# Patient Record
Sex: Male | Born: 1945 | Race: White | Hispanic: No | Smoking: Current every day smoker
Health system: Southern US, Community
[De-identification: ages and names within clinical notes are randomized; demographics above are authoritative.]

## PROBLEM LIST (undated history)

## (undated) DIAGNOSIS — C801 Malignant (primary) neoplasm, unspecified: Secondary | ICD-10-CM

## (undated) DIAGNOSIS — F329 Major depressive disorder, single episode, unspecified: Secondary | ICD-10-CM

## (undated) DIAGNOSIS — K219 Gastro-esophageal reflux disease without esophagitis: Secondary | ICD-10-CM

## (undated) DIAGNOSIS — F32A Depression, unspecified: Secondary | ICD-10-CM

## (undated) DIAGNOSIS — Z974 Presence of external hearing-aid: Secondary | ICD-10-CM

## (undated) DIAGNOSIS — C20 Malignant neoplasm of rectum: Principal | ICD-10-CM

## (undated) DIAGNOSIS — H9190 Unspecified hearing loss, unspecified ear: Secondary | ICD-10-CM

## (undated) DIAGNOSIS — Z972 Presence of dental prosthetic device (complete) (partial): Secondary | ICD-10-CM

## (undated) HISTORY — DX: Malignant neoplasm of rectum: C20

## (undated) HISTORY — PX: CHOLECYSTECTOMY: SHX55

---
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
## 2009-09-22 ENCOUNTER — Emergency Department (HOSPITAL_COMMUNITY): Admission: EM | Admit: 2009-09-22 | Discharge: 2009-09-22 | Payer: Self-pay | Admitting: Emergency Medicine

## 2011-03-07 LAB — CULTURE, ROUTINE-ABSCESS

## 2013-02-22 ENCOUNTER — Telehealth: Payer: Self-pay | Admitting: Family Medicine

## 2013-02-22 NOTE — Telephone Encounter (Signed)
Ok to refill 

## 2013-02-22 NOTE — Telephone Encounter (Signed)
[facility]

## 2013-02-22 NOTE — Telephone Encounter (Signed)
Ok to refill with 30 and 2 refill

## 2013-07-01 ENCOUNTER — Other Ambulatory Visit: Payer: Self-pay | Admitting: Family Medicine

## 2013-07-01 NOTE — Telephone Encounter (Signed)
Ok to refill 

## 2013-07-01 NOTE — Telephone Encounter (Signed)
?   OK to Refill  

## 2013-07-02 NOTE — Telephone Encounter (Signed)
rx called in

## 2013-10-14 ENCOUNTER — Other Ambulatory Visit: Payer: Self-pay | Admitting: Family Medicine

## 2013-10-15 NOTE — Telephone Encounter (Signed)
This patient has had no office visit since being on Epic. Review his  paper chart to see date of last office visit. See If he  has other medical problems that need to be addressed. If he has other medical problems that need to  be addressed then he needs to schedule office visit prior to any refills . If there really are no other medical problems and he has been seen within the past year, then it is okay to give him refills for #30+2 additional refills.

## 2013-10-15 NOTE — Telephone Encounter (Signed)
?   OK to Refill  

## 2013-11-05 ENCOUNTER — Telehealth: Payer: Self-pay | Admitting: Family Medicine

## 2013-11-05 NOTE — Telephone Encounter (Signed)
NEEDS ZOLPIDEM TARTRATE 10MG 

## 2013-11-08 NOTE — Telephone Encounter (Signed)
.?   OK to Refill - LOV 12/2012

## 2013-11-08 NOTE — Telephone Encounter (Signed)
ok 

## 2013-11-09 MED ORDER — ZOLPIDEM TARTRATE 10 MG PO TABS: ORAL_TABLET | ORAL | Status: DC

## 2013-11-09 NOTE — Telephone Encounter (Signed)
Med called out to pharm 

## 2014-03-29 ENCOUNTER — Other Ambulatory Visit: Payer: Self-pay | Admitting: Family Medicine

## 2014-03-29 NOTE — Telephone Encounter (Signed)
?   OK to Refill  

## 2014-03-29 NOTE — Telephone Encounter (Signed)
ok 

## 2014-08-23 ENCOUNTER — Telehealth: Payer: Self-pay | Admitting: Family Medicine

## 2014-08-23 ENCOUNTER — Encounter: Payer: Self-pay | Admitting: Family Medicine

## 2014-08-23 NOTE — Telephone Encounter (Signed)
Attempted to call pt to schedule [facility] the phone was disconnected I am sending a letter to the patient

## 2014-08-25 ENCOUNTER — Other Ambulatory Visit: Payer: Self-pay | Admitting: Family Medicine

## 2014-08-26 NOTE — Telephone Encounter (Signed)
Medication called to pharmacy.  Letter sent.  

## 2014-08-26 NOTE — Telephone Encounter (Signed)
ok 

## 2014-08-26 NOTE — Telephone Encounter (Signed)
Ok to refill??  Last office visit 12/2012.  Last refill 03/30/2014, #2 refills.  Ok to send letter?

## 2014-09-06 ENCOUNTER — Telehealth: Payer: Self-pay | Admitting: Family Medicine

## 2014-09-06 ENCOUNTER — Encounter: Payer: Self-pay | Admitting: Family Medicine

## 2014-09-06 NOTE — Telephone Encounter (Signed)
Letter sent pt to call and schedule [facility]

## 2014-09-27 ENCOUNTER — Other Ambulatory Visit: Payer: Self-pay | Admitting: Family Medicine

## 2014-09-27 NOTE — Telephone Encounter (Signed)
?   OK to Refill  

## 2014-09-27 NOTE — Telephone Encounter (Signed)
ok 

## 2014-09-28 NOTE — Telephone Encounter (Signed)
Script refilled and sent to pharmacy. 

## 2014-09-29 ENCOUNTER — Other Ambulatory Visit: Payer: Self-pay | Admitting: Family Medicine

## 2014-09-29 NOTE — Telephone Encounter (Signed)
?   OK to Refill -  Has not been here since Epic. This is the only med he takes

## 2014-09-29 NOTE — Telephone Encounter (Signed)
Refill appropriate and filled per protocol. 

## 2014-09-29 NOTE — Telephone Encounter (Signed)
ok 

## 2014-11-08 ENCOUNTER — Other Ambulatory Visit: Payer: Self-pay | Admitting: Family Medicine

## 2014-11-09 NOTE — Telephone Encounter (Signed)
Ok to refill 

## 2014-11-10 ENCOUNTER — Encounter: Payer: Self-pay | Admitting: *Deleted

## 2014-11-10 NOTE — Telephone Encounter (Signed)
I have never seen this patient

## 2014-11-10 NOTE — Telephone Encounter (Signed)
Refill denied.   Requires office visit before any further refills can be given.   Letter sent.

## 2014-11-12 ENCOUNTER — Emergency Department: Payer: Self-pay | Admitting: Internal Medicine

## 2014-11-12 LAB — COMPREHENSIVE METABOLIC PANEL
ALBUMIN: 3.3 g/dL — AB (ref 3.4–5.0)
ALK PHOS: 70 U/L
ALT: 20 U/L
ANION GAP: 5 — AB (ref 7–16)
BILIRUBIN TOTAL: 0.3 mg/dL (ref 0.2–1.0)
BUN: 11 mg/dL (ref 7–18)
CALCIUM: 8.2 mg/dL — AB (ref 8.5–10.1)
CHLORIDE: 107 mmol/L (ref 98–107)
CREATININE: 0.88 mg/dL (ref 0.60–1.30)
Co2: 28 mmol/L (ref 21–32)
EGFR (Non-African Amer.): >60
Glucose: 93 mg/dL (ref 65–99)
OSMOLALITY: 278 (ref 275–301)
POTASSIUM: 4.2 mmol/L (ref 3.5–5.1)
SGOT(AST): 25 U/L (ref 15–37)
Sodium: 140 mmol/L (ref 136–145)
TOTAL PROTEIN: 7.1 g/dL (ref 6.4–8.2)

## 2014-11-12 LAB — CBC
HCT: 48.2 % (ref 40.0–52.0)
HGB: 16.1 g/dL (ref 13.0–18.0)
MCH: 31.1 pg (ref 26.0–34.0)
MCHC: 33.5 g/dL (ref 32.0–36.0)
MCV: 93 fL (ref 80–100)
Platelet: 358 10*3/uL (ref 150–440)
RBC: 5.19 10*6/uL (ref 4.40–5.90)
RDW: 13.7 % (ref 11.5–14.5)
WBC: 10.9 10*3/uL — AB (ref 3.8–10.6)

## 2014-11-12 LAB — TROPONIN I
Troponin-I: <0.02 ng/mL
Troponin-I: <0.02 ng/mL

## 2014-11-15 ENCOUNTER — Other Ambulatory Visit: Payer: Self-pay | Admitting: Family Medicine

## 2014-11-15 NOTE — Telephone Encounter (Signed)
Pt has not been seen in over 1 year.  Has been sent multiple letters to make appt and has not.  Refill denied

## 2014-11-16 ENCOUNTER — Encounter: Payer: Self-pay | Admitting: Cardiovascular Disease

## 2014-11-16 ENCOUNTER — Ambulatory Visit (INDEPENDENT_AMBULATORY_CARE_PROVIDER_SITE_OTHER): Payer: Medicare PPO | Admitting: Cardiovascular Disease

## 2014-11-16 VITALS — BP 120/82 | HR 66 | Ht 66.0 in | Wt 151.0 lb

## 2014-11-16 DIAGNOSIS — Z7189 Other specified counseling: Secondary | ICD-10-CM

## 2014-11-16 DIAGNOSIS — R079 Chest pain, unspecified: Secondary | ICD-10-CM

## 2014-11-16 DIAGNOSIS — E785 Hyperlipidemia, unspecified: Secondary | ICD-10-CM

## 2014-11-16 DIAGNOSIS — G4700 Insomnia, unspecified: Secondary | ICD-10-CM

## 2014-11-16 DIAGNOSIS — I714 Abdominal aortic aneurysm, without rupture, unspecified: Secondary | ICD-10-CM

## 2014-11-16 DIAGNOSIS — Z7689 Persons encountering health services in other specified circumstances: Secondary | ICD-10-CM

## 2014-11-16 DIAGNOSIS — Z720 Tobacco use: Secondary | ICD-10-CM

## 2014-11-16 MED ORDER — ZOLPIDEM TARTRATE 10 MG PO TABS: 10.0000 mg | ORAL_TABLET | Freq: Every evening | ORAL | Status: DC | PRN

## 2014-11-16 NOTE — Assessment & Plan Note (Signed)
I requested fasting lipid and liver profile. 

## 2014-11-16 NOTE — Assessment & Plan Note (Signed)
I requested an abdominal aortic ultrasound to evaluate for an aneurysm given his age and prolonged history of tobacco use.

## 2014-11-16 NOTE — Patient Instructions (Addendum)
Your physician recommends that you continue on your current medications as directed. Please refer to the Current Medication list given to you today.  Your physician has requested that you have an abdominal aorta duplex. During this test, an ultrasound is used to evaluate the aorta. Allow 30 minutes for this exam. Do not eat after midnight the day before and avoid carbonated beverages  Your physician recommends that you return for lab work in:  Fasting Lipid and Liver Panel the same day as your abdominal aortic duplex   You have been referred to [facility] for [place].   Your physician recommends that you schedule a follow-up appointment in:  Dr. Fletcher Anon after your tests   [place]  Your caregiver has ordered a Stress Test with nuclear imaging. The purpose of this test is to evaluate the blood supply to your heart muscle. This procedure is referred to as a "Non-Invasive Stress Test." This is because other than having an IV started in your vein, nothing is inserted or "invades" your body. Cardiac stress tests are done to find areas of poor blood flow to the heart by determining the extent of coronary artery disease (CAD). Some patients exercise on a treadmill, which naturally increases the blood flow to your heart, while others who are  unable to walk on a treadmill due to physical limitations have a pharmacologic/chemical stress agent called Lexiscan . This medicine will mimic walking on a treadmill by temporarily increasing your coronary blood flow.   Please note: these test may take anywhere between 2-4 hours to complete  PLEASE REPORT TO [place] AT THE FIRST DESK WILL DIRECT YOU WHERE TO GO  Date of Procedure:_________12/18/15____________________________  Arrival Time for Procedure:______1015 am________________________    PLEASE NOTIFY THE OFFICE AT LEAST 24 HOURS IN ADVANCE IF YOU ARE UNABLE TO KEEP YOUR APPOINTMENT.  [phone] AND   PLEASE NOTIFY NUCLEAR MEDICINE AT [facility] AT LEAST 24 HOURS IN ADVANCE IF YOU ARE UNABLE TO KEEP YOUR APPOINTMENT. [phone]  How to prepare for your Myoview test:  1. Do not eat or drink after midnight 2. No caffeine for 24 hours prior to test 3. No smoking 24 hours prior to test. 4. Your medication may be taken with water.  If your doctor stopped a medication because of this test, do not take that medication. 5. Ladies, please do not wear dresses.  Skirts or pants are appropriate. Please wear a short sleeve shirt. 6. No perfume, cologne or lotion. 7. Wear comfortable walking shoes. No heels!

## 2014-11-16 NOTE — Assessment & Plan Note (Signed)
I agreed to give him one time refill on Ambien and that he establishes with a primary care physician. I referred him today.

## 2014-11-16 NOTE — Assessment & Plan Note (Signed)
The patient had one episode of exertional chest pain which is concerning for angina especially with his prolonged history of tobacco use and lack of regular screening for hyperlipidemia. Baseline ECG is normal. I recommend evaluation with a treadmill nuclear stress test. He is to continue aspirin 81 mg once daily.

## 2014-11-16 NOTE — Progress Notes (Signed)
HPI  This is a 68 year old man who was referred from the emergency room at [facility] for evaluation of chest pain. He currently does not have any primary care physician. He has no history of hypertension or diabetes. He has not had lipid profile checked. He has prolonged history of tobacco use and has been smoking one pack per day since he was 68 years old. He reports significant insomnia recently. He used to be on Ambien in the past but ran out of refills. He has no family history of coronary artery disease. He reports chronic cough and mild exertional dyspnea. He had one episode of chest pain last week while he was working in the yard. It was substernal, sharp in nature with no radiation. It lasted for about 20 minutes and was associated with sweating. He was taken to the emergency room by EMS. Basic workup was negative including negative cardiac enzymes, EKG and chest x-ray. He reports no further episodes since then.  No Known Allergies   No current outpatient prescriptions on file prior to visit.   No current facility-administered medications on file prior to visit.     History reviewed. No pertinent past medical history.   Past Surgical History  Procedure Laterality Date  . Cholecystectomy       Family History  Problem Relation Age of Onset  . Family history unknown: Yes     History   Social History  . Marital Status: Married    Spouse Name: N/A    Number of Children: N/A  . Years of Education: N/A   Occupational History  . Not on file.   Social History Main Topics  . Smoking status: Current Every Day Smoker -- 1.00 packs/day for 56 years    Types: Cigarettes  . Smokeless tobacco: Not on file  . Alcohol Use: No  . Drug Use: No  . Sexual Activity: Not on file   Other Topics Concern  . Not on file   Social History Narrative  . No narrative on file     ROS A 10 point review of system was performed. It is negative other than that mentioned in the history of  present illness.   PHYSICAL EXAM   BP 120/82 mmHg  Pulse 66  Ht 5\' 6"  (1.676 m)  Wt 151 lb (68.493 kg)  BMI 24.38 kg/m2 Constitutional: He is oriented to person, place, and time. He appears well-developed and well-nourished. No distress.  HENT: No nasal discharge.  Head: Normocephalic and atraumatic.  Eyes: Pupils are equal and round.  No discharge. Neck: Normal range of motion. Neck supple. No JVD present. No thyromegaly present.  Cardiovascular: Normal rate, regular rhythm, normal heart sounds. Exam reveals no gallop and no friction rub. No murmur heard.  Pulmonary/Chest: Effort normal and breath sounds normal. No stridor. No respiratory distress. He has no wheezes. He has no rales. He exhibits no tenderness.  Abdominal: Soft. Bowel sounds are normal. He exhibits no distension. There is no tenderness. There is no rebound and no guarding.  Musculoskeletal: Normal range of motion. He exhibits no edema and no tenderness.  Neurological: He is alert and oriented to person, place, and time. Coordination normal.  Skin: Skin is warm and dry. No rash noted. He is not diaphoretic. No erythema. No pallor.  Psychiatric: He has a normal mood and affect. His behavior is normal. Judgment and thought content normal.       POE:UMPNT  Rhythm  WITHIN NORMAL LIMITS   ASSESSMENT AND PLAN

## 2014-11-17 ENCOUNTER — Encounter: Payer: Self-pay | Admitting: *Deleted

## 2014-11-18 ENCOUNTER — Ambulatory Visit: Payer: Self-pay | Admitting: Cardiovascular Disease

## 2014-11-21 ENCOUNTER — Ambulatory Visit: Payer: Self-pay | Admitting: Cardiovascular Disease

## 2014-11-21 DIAGNOSIS — R079 Chest pain, unspecified: Secondary | ICD-10-CM

## 2014-11-22 ENCOUNTER — Other Ambulatory Visit: Payer: Self-pay

## 2014-11-22 DIAGNOSIS — R079 Chest pain, unspecified: Secondary | ICD-10-CM

## 2014-12-12 ENCOUNTER — Encounter (INDEPENDENT_AMBULATORY_CARE_PROVIDER_SITE_OTHER): Payer: Medicare PPO

## 2014-12-12 ENCOUNTER — Other Ambulatory Visit (INDEPENDENT_AMBULATORY_CARE_PROVIDER_SITE_OTHER): Payer: Medicare PPO | Admitting: *Deleted

## 2014-12-12 DIAGNOSIS — I714 Abdominal aortic aneurysm, without rupture, unspecified: Secondary | ICD-10-CM

## 2014-12-12 DIAGNOSIS — R079 Chest pain, unspecified: Secondary | ICD-10-CM

## 2014-12-13 LAB — LIPID PANEL
CHOL/HDL RATIO: 6.2 ratio — AB (ref 0.0–5.0)
Cholesterol, Total: 185 mg/dL (ref 100–199)
HDL: 30 mg/dL — AB (ref >39)
LDL Calculated: 123 mg/dL — ABNORMAL HIGH (ref 0–99)
TRIGLYCERIDES: 160 mg/dL — AB (ref 0–149)
VLDL CHOLESTEROL CAL: 32 mg/dL (ref 5–40)

## 2014-12-13 LAB — HEPATIC FUNCTION PANEL
ALBUMIN: 4.2 g/dL (ref 3.6–4.8)
ALK PHOS: 65 IU/L (ref 39–117)
ALT: 18 IU/L (ref 0–44)
AST: 21 IU/L (ref 0–40)
Bilirubin, Direct: 0.1 mg/dL (ref 0.00–0.40)
TOTAL PROTEIN: 6.9 g/dL (ref 6.0–8.5)
Total Bilirubin: 0.4 mg/dL (ref 0.0–1.2)

## 2014-12-16 ENCOUNTER — Ambulatory Visit (INDEPENDENT_AMBULATORY_CARE_PROVIDER_SITE_OTHER): Payer: Medicare PPO | Admitting: Cardiovascular Disease

## 2014-12-16 ENCOUNTER — Encounter: Payer: Self-pay | Admitting: Cardiovascular Disease

## 2014-12-16 VITALS — BP 118/80 | HR 81 | Ht 66.0 in | Wt 152.8 lb

## 2014-12-16 DIAGNOSIS — E785 Hyperlipidemia, unspecified: Secondary | ICD-10-CM

## 2014-12-16 DIAGNOSIS — G4700 Insomnia, unspecified: Secondary | ICD-10-CM

## 2014-12-16 DIAGNOSIS — Z720 Tobacco use: Secondary | ICD-10-CM

## 2014-12-16 DIAGNOSIS — R0789 Other chest pain: Secondary | ICD-10-CM

## 2014-12-16 MED ORDER — ZOLPIDEM TARTRATE 10 MG PO TABS: 10.0000 mg | ORAL_TABLET | Freq: Every evening | ORAL | Status: DC | PRN

## 2014-12-16 NOTE — Assessment & Plan Note (Signed)
I discussed the importance of smoking cessation with him.

## 2014-12-16 NOTE — Assessment & Plan Note (Signed)
Lab Results  Component Value Date   HDL 30* 12/12/2014   LDLCALC 123* 12/12/2014   TRIG 160* 12/12/2014   CHOLHDL 6.2* 12/12/2014   I discussed with him the importance of lifestyle changes, healthy diet and regular exercise.

## 2014-12-16 NOTE — Assessment & Plan Note (Signed)
Treadmill nuclear stress test showed no evidence of ischemia with normal ejection fraction. He has not had any recurrent symptoms. Follow up as needed. I advised him to establish with his primary care physician. He has an appointment next month.

## 2014-12-16 NOTE — Assessment & Plan Note (Signed)
This seems to be a major issue for him since he had shingles. I agreed to give him another 15 tablets of Ambien and that he establishes with new primary care physician.

## 2014-12-16 NOTE — Progress Notes (Signed)
HPI  This is a 69 year old man who is here today for a follow-up visit regarding chest pain. He has no history of hypertension or diabetes.  He has prolonged history of tobacco use and has been smoking one pack per day since he was 69 years old. He reports significant insomnia recently.  He has no family history of coronary artery disease. He reports chronic cough and mild exertional dyspnea. He had one episode of chest pain while he was working in the yard. It was substernal, sharp in nature with no radiation. It lasted for about 20 minutes and was associated with sweating. He was taken to the emergency room by EMS. Basic workup was negative including negative cardiac enzymes, EKG and chest x-ray.  I proceeded with a treadmill nuclear stress test which showed no evidence of ischemia with normal ejection fraction. Abdominal aortic ultrasound showed no evidence of aortic aneurysm. He reports no further episodes of chest pain.  No Known Allergies   Current Outpatient Prescriptions on File Prior to Visit  Medication Sig Dispense Refill  . aspirin 81 MG tablet Take 81 mg by mouth 2 (two) times daily.      No current facility-administered medications on file prior to visit.     History reviewed. No pertinent past medical history.   Past Surgical History  Procedure Laterality Date  . Cholecystectomy       Family History  Problem Relation Age of Onset  . Family history unknown: Yes     History   Social History  . Marital Status: Married    Spouse Name: N/A    Number of Children: N/A  . Years of Education: N/A   Occupational History  . Not on file.   Social History Main Topics  . Smoking status: Current Every Day Smoker -- 1.00 packs/day for 56 years    Types: Cigarettes  . Smokeless tobacco: Not on file  . Alcohol Use: No  . Drug Use: No  . Sexual Activity: Not on file   Other Topics Concern  . Not on file   Social History Narrative     ROS A 10 point review of  system was performed. It is negative other than that mentioned in the history of present illness.   PHYSICAL EXAM   BP 118/80 mmHg  Pulse 81  Ht 5\' 6"  (1.676 m)  Wt 152 lb 12 oz (69.287 kg)  BMI 24.67 kg/m2 Constitutional: He is oriented to person, place, and time. He appears well-developed and well-nourished. No distress.  HENT: No nasal discharge.  Head: Normocephalic and atraumatic.  Eyes: Pupils are equal and round.  No discharge. Neck: Normal range of motion. Neck supple. No JVD present. No thyromegaly present.  Cardiovascular: Normal rate, regular rhythm, normal heart sounds. Exam reveals no gallop and no friction rub. No murmur heard.  Pulmonary/Chest: Effort normal and breath sounds normal. No stridor. No respiratory distress. He has no wheezes. He has no rales. He exhibits no tenderness.  Abdominal: Soft. Bowel sounds are normal. He exhibits no distension. There is no tenderness. There is no rebound and no guarding.  Musculoskeletal: Normal range of motion. He exhibits no edema and no tenderness.  Neurological: He is alert and oriented to person, place, and time. Coordination normal.  Skin: Skin is warm and dry. No rash noted. He is not diaphoretic. No erythema. No pallor.  Psychiatric: He has a normal mood and affect. His behavior is normal. Judgment and thought content normal.  ETK:KOECX  Rhythm  WITHIN NORMAL LIMITS   ASSESSMENT AND PLAN

## 2014-12-16 NOTE — Patient Instructions (Signed)
Follow up as needed

## 2015-01-04 ENCOUNTER — Encounter: Payer: Self-pay | Admitting: Internal Medicine

## 2015-01-04 ENCOUNTER — Telehealth: Payer: Self-pay | Admitting: Internal Medicine

## 2015-01-04 ENCOUNTER — Ambulatory Visit (INDEPENDENT_AMBULATORY_CARE_PROVIDER_SITE_OTHER): Payer: Medicare PPO | Admitting: Internal Medicine

## 2015-01-04 VITALS — BP 122/86 | HR 72 | Temp 97.8°F | Wt 150.0 lb

## 2015-01-04 DIAGNOSIS — G4700 Insomnia, unspecified: Secondary | ICD-10-CM

## 2015-01-04 DIAGNOSIS — F329 Major depressive disorder, single episode, unspecified: Secondary | ICD-10-CM

## 2015-01-04 DIAGNOSIS — F0631 Mood disorder due to known physiological condition with depressive features: Secondary | ICD-10-CM | POA: Insufficient documentation

## 2015-01-04 DIAGNOSIS — F32A Depression, unspecified: Secondary | ICD-10-CM

## 2015-01-04 MED ORDER — BUSPIRONE HCL 10 MG PO TABS: 10.0000 mg | ORAL_TABLET | Freq: Two times a day (BID) | ORAL | Status: DC

## 2015-01-04 NOTE — Telephone Encounter (Signed)
emmi mailed  °

## 2015-01-04 NOTE — Progress Notes (Signed)
Pre visit review using our clinic review tool, if applicable. No additional management support is needed unless otherwise documented below in the visit note. 

## 2015-01-04 NOTE — Progress Notes (Signed)
HPI  Pt presents to the clinic today to establish care and for management of the conditions listed below. He is transferring care from his PCP in [facility]. Flu: never Tetanus: more than 10 years ago PSA Screening: never, not interested Colon Screening: never, not interested Vision Screening: as needed Dentist: no, dentures  Depression: He reports this has been a chronic issue since his childhood. He reports he did not have a stable upbringing.  He has had suicidal thoughts at times, not currently, but has never had a concrete plan. He also reports that he could never "do it" because he loves his children too much. He has never been treated for depression. He did go see a therapist one time but reports he never went back after the therapist told him "you are just looking for someone to feel sorry for you". He is accompanied by his aunt today who is his main support. He lives right behind her and they have contact every day.  Insomnia: This is related to his depression. He reports he has had trouble falling asleep because he can't shut off his mind. Once he falls asleep, he is able to stay asleep. He has tried Ambien but reports it has not helped him fall asleep and it "makes me feel weird".  No past medical history on file.  Current Outpatient Prescriptions  Medication Sig Dispense Refill  . aspirin 81 MG tablet Take 81 mg by mouth 2 (two) times daily.     Marland Kitchen zolpidem (AMBIEN) 10 MG tablet Take 1 tablet (10 mg total) by mouth at bedtime as needed. 15 tablet 0   No current facility-administered medications for this visit.    No Known Allergies  Family History  Problem Relation Age of Onset  . Cancer Mother     Kidney  . Cancer Paternal Uncle     colon    History   Social History  . Marital Status: Widowed    Spouse Name: N/A    Number of Children: N/A  . Years of Education: N/A   Occupational History  . Not on file.   Social History Main Topics  . Smoking status:  Current Every Day Smoker -- 1.00 packs/day for 56 years    Types: Cigarettes  . Smokeless tobacco: Never Used  . Alcohol Use: No  . Drug Use: No  . Sexual Activity: Not on file   Other Topics Concern  . Not on file   Social History Narrative    ROS:  Constitutional: Pt reports fatigue. Denies fever, malaise, headache or abrupt weight changes.  Respiratory: Denies difficulty breathing, shortness of breath, cough or sputum production.   Cardiovascular: Pt reports occasional chest pain. Denies chest tightness, palpitations or swelling in the hands or feet.  Gastrointestinal: Denies abdominal pain, bloating, constipation, diarrhea or blood in the stool.  Skin: Denies redness, rashes, lesions or ulcercations.  Neurological: Denies dizziness, difficulty with memory, difficulty with speech or problems with balance and coordination.  Psych: Pt reports depression. Denies SI/HI. No other specific complaints in a complete review of systems (except as listed in HPI above).  PE:  BP 122/86 mmHg  Pulse 72  Temp(Src) 97.8 F (36.6 C) (Oral)  Wt 150 lb (68.04 kg)  SpO2 98% Wt Readings from Last 3 Encounters:  01/04/15 150 lb (68.04 kg)  12/16/14 152 lb 12 oz (69.287 kg)  11/16/14 151 lb (68.493 kg)    General: Appears his stated age, well developed, well nourished in NAD. HEENT: Head:  normal shape and size; Eyes: sclera white, no icterus, conjunctiva pink, PERRLA and EOMs intact;  Cardiovascular: Normal rate and rhythm. S1,S2 noted.  No murmur, rubs or gallops noted. No JVD or BLE edema. No carotid bruits noted. Pulmonary/Chest: Normal effort and positive vesicular breath sounds. No respiratory distress. No wheezes, rales or ronchi noted.  Neurological: Alert and oriented. Cranial nerves II-XII grossly intact.  Psychiatric: Mood and affect withdrawn. Sitting with arms crossed. Avoiding eye contact. Tearful at times but open and honest with questions.  Lipid Panel     Component Value  Date/Time   TRIG 160* 12/12/2014 0857   HDL 30* 12/12/2014 0857   CHOLHDL 6.2* 12/12/2014 0857   LDLCALC 123* 12/12/2014 0857      Assessment and Plan:

## 2015-01-04 NOTE — Assessment & Plan Note (Addendum)
Support offered today Discussed Remeron but his aunt seems to be more interested in Buspar as this has worked for her sister Will start Buspar 10 mg daily Advised him to take for 4 weeks, and not to stop unless he has an adverse reaction or SI Advised him if he has SI to go straight to the ER He declines referral for therapy at this time He declines blood work today  RTC in 1 month for followup

## 2015-01-04 NOTE — Assessment & Plan Note (Signed)
R/T depression Stop ambien Suggested Remeron but his aunt is more interested in trying Buspar as this has worked for her sister in the past Discussed a healthy sleep routine: dark room, no TV

## 2015-01-04 NOTE — Patient Instructions (Signed)

## 2015-01-11 ENCOUNTER — Telehealth: Payer: Self-pay

## 2015-01-11 MED ORDER — MIRTAZAPINE 15 MG PO TABS
15.0000 mg | ORAL_TABLET | Freq: Every day | ORAL | Status: DC
Start: 2015-01-11 — End: 2015-02-03

## 2015-01-11 NOTE — Telephone Encounter (Signed)
Called patient and also spoken to [employer]. Will try Remeron as suggested.

## 2015-01-11 NOTE — Telephone Encounter (Signed)
I reviewed Wesley [name]'s note - she had originally recommended Remeron -which would have been my first choice also- if the buspar is not working for him- would they like to go ahead and try that?

## 2015-01-11 NOTE — Telephone Encounter (Signed)
Called patient, inform of Dr Marliss Coots comments. rx called [facility].

## 2015-01-11 NOTE — Telephone Encounter (Signed)
Stop the buspar  Once off of it one day start remeron at bedtime If side effects (worse depression or anxiety)- stop it and update  Follow up with Rollene Fare as planned  Of note- this should help sleep and will increase appetite   Px written for call in

## 2015-01-11 NOTE — Telephone Encounter (Signed)
Wesley [name] left v/m; pt was seen 01/04/15 to establish care;pt is not doing any better since seen and started Buspar 10 one tab twice a day. Pt is not sleeping at night and is getting agitated by his situation. Request cb. Webb Silversmith NP is not in office.Please advise.[facility].

## 2015-02-02 ENCOUNTER — Ambulatory Visit: Payer: Medicare PPO | Admitting: Internal Medicine

## 2015-02-03 ENCOUNTER — Ambulatory Visit (INDEPENDENT_AMBULATORY_CARE_PROVIDER_SITE_OTHER): Payer: Medicare PPO | Admitting: Internal Medicine

## 2015-02-03 ENCOUNTER — Encounter: Payer: Self-pay | Admitting: Internal Medicine

## 2015-02-03 VITALS — BP 108/68 | HR 83 | Temp 98.2°F | Wt 153.0 lb

## 2015-02-03 DIAGNOSIS — F329 Major depressive disorder, single episode, unspecified: Secondary | ICD-10-CM

## 2015-02-03 DIAGNOSIS — F32A Depression, unspecified: Secondary | ICD-10-CM

## 2015-02-03 DIAGNOSIS — G4700 Insomnia, unspecified: Secondary | ICD-10-CM

## 2015-02-03 MED ORDER — MIRTAZAPINE 30 MG PO TABS: 30.0000 mg | ORAL_TABLET | Freq: Every day | ORAL | Status: DC

## 2015-02-03 NOTE — Patient Instructions (Signed)
Insomnia Insomnia is frequent trouble falling and/or staying asleep. Insomnia can be a long term problem or a short term problem. Both are common. Insomnia can be a short term problem when the wakefulness is related to a certain stress or worry. Long term insomnia is often related to ongoing stress during waking hours and/or poor sleeping habits. Overtime, sleep deprivation itself can make the problem worse. Every little thing feels more severe because you are overtired and your ability to cope is decreased. CAUSES   Stress, anxiety, and depression.  Poor sleeping habits.  Distractions such as TV in the bedroom.  Naps close to bedtime.  Engaging in emotionally charged conversations before bed.  Technical reading before sleep.  Alcohol and other sedatives. They may make the problem worse. They can hurt normal sleep patterns and normal dream activity.  Stimulants such as caffeine for several hours prior to bedtime.  Pain syndromes and shortness of breath can cause insomnia.  Exercise late at night.  Changing time zones may cause sleeping problems (jet lag). It is sometimes helpful to have someone observe your sleeping patterns. They should look for periods of not breathing during the night (sleep apnea). They should also look to see how long those periods last. If you live alone or observers are uncertain, you can also be observed at a sleep clinic where your sleep patterns will be professionally monitored. Sleep apnea requires a checkup and treatment. Give your caregivers your medical history. Give your caregivers observations your family has made about your sleep.  SYMPTOMS   Not feeling rested in the morning.  Anxiety and restlessness at bedtime.  Difficulty falling and staying asleep. TREATMENT   Your caregiver may prescribe treatment for an underlying medical disorders. Your caregiver can give advice or help if you are using alcohol or other drugs for self-medication. Treatment  of underlying problems will usually eliminate insomnia problems.  Medications can be prescribed for short time use. They are generally not recommended for lengthy use.  Over-the-counter sleep medicines are not recommended for lengthy use. They can be habit forming.  You can promote easier sleeping by making lifestyle changes such as:  Using relaxation techniques that help with breathing and reduce muscle tension.  Exercising earlier in the day.  Changing your diet and the time of your last meal. No night time snacks.  Establish a regular time to go to bed.  Counseling can help with stressful problems and worry.  Soothing music and white noise may be helpful if there are background noises you cannot remove.  Stop tedious detailed work at least one hour before bedtime. HOME CARE INSTRUCTIONS   Keep a diary. Inform your caregiver about your progress. This includes any medication side effects. See your caregiver regularly. Take note of:  Times when you are asleep.  Times when you are awake during the night.  The quality of your sleep.  How you feel the next day. This information will help your caregiver care for you.  Get out of bed if you are still awake after 15 minutes. Read or do some quiet activity. Keep the lights down. Wait until you feel sleepy and go back to bed.  Keep regular sleeping and waking hours. Avoid naps.  Exercise regularly.  Avoid distractions at bedtime. Distractions include watching television or engaging in any intense or detailed activity like attempting to balance the household checkbook.  Develop a bedtime ritual. Keep a familiar routine of bathing, brushing your teeth, climbing into bed at the same   time each night, listening to soothing music. Routines increase the success of falling to sleep faster.  Use relaxation techniques. This can be using breathing and muscle tension release routines. It can also include visualizing peaceful scenes. You can  also help control troubling or intruding thoughts by keeping your mind occupied with boring or repetitive thoughts like the old concept of counting sheep. You can make it more creative like imagining planting one beautiful flower after another in your backyard garden.  During your day, work to eliminate stress. When this is not possible use some of the previous suggestions to help reduce the anxiety that accompanies stressful situations. MAKE SURE YOU:   Understand these instructions.  Will watch your condition.  Will get help right away if you are not doing well or get worse. Document Released: 11/15/2000 Document Revised: 02/10/2012 Document Reviewed: 12/16/2007 [employer] Patient Information 2015 [employer]. This information is not intended to replace advice given to you by your health care provider. Make sure you discuss any questions you have with your health care provider.  

## 2015-02-03 NOTE — Progress Notes (Signed)
Pre visit review using our clinic review tool, if applicable. No additional management support is needed unless otherwise documented below in the visit note. 

## 2015-02-03 NOTE — Assessment & Plan Note (Signed)
Improved with Remeron but he would like to to be better Will increase to 30 mg QHS He will update me in 4 weeks

## 2015-02-03 NOTE — Progress Notes (Signed)
Subjective:    Patient ID: Wesley [name], male    DOB: 1946/11/20, 69 y.o.   MRN: [id number]  HPI  Pt presents to the clinic today for 1 month follow up of depression and insomnia. He was started on Buspar (per his request). He took that for a short time but began feeling more anxious and had more trouble sleeping. He was started on Remeron. He has been taking it as prescribed and reports he is feeling better. He is sleeping a little better and feels less depressed. He does think the dose could be increased but he denies SI/HI.   Review of Systems      No past medical history on file.  Current Outpatient Prescriptions  Medication Sig Dispense Refill  . aspirin 81 MG tablet Take 81 mg by mouth 2 (two) times daily.     . mirtazapine (REMERON) 15 MG tablet Take 1 tablet (15 mg total) by mouth at bedtime. 30 tablet 1   No current facility-administered medications for this visit.    No Known Allergies  Family History  Problem Relation Age of Onset  . Cancer Mother     Kidney  . Diabetes Mother   . Cancer Paternal Uncle     colon  . Hyperlipidemia Neg Hx   . Hypertension Neg Hx   . Stroke Neg Hx     History   Social History  . Marital Status: Widowed    Spouse Name: N/A  . Number of Children: N/A  . Years of Education: N/A   Occupational History  . Not on file.   Social History Main Topics  . Smoking status: Current Every Day Smoker -- 1.00 packs/day for 56 years    Types: Cigarettes  . Smokeless tobacco: Never Used  . Alcohol Use: No  . Drug Use: No  . Sexual Activity: Not Currently   Other Topics Concern  . Not on file   Social History Narrative     Constitutional: Denies fever, malaise, fatigue, headache or abrupt weight changes.  Neurological: Denies dizziness, difficulty with memory, difficulty with speech or problems with balance and coordination.  Psych: Pt reports anxiety/depression. Denies SI/HI.  No other specific complaints in a complete  review of systems (except as listed in HPI above).  Objective:   Physical Exam   BP 108/68 mmHg  Pulse 83  Temp(Src) 98.2 F (36.8 C) (Oral)  Wt 153 lb (69.4 kg)  SpO2 97% Wt Readings from Last 3 Encounters:  02/03/15 153 lb (69.4 kg)  01/04/15 150 lb (68.04 kg)  12/16/14 152 lb 12 oz (69.287 kg)    General: Appears his stated age, well developed, well nourished in NAD. Cardiovascular: Normal rate and rhythm. S1,S2 noted.  No murmur, rubs or gallops noted.  Pulmonary/Chest: Normal effort and positive vesicular breath sounds. No respiratory distress. No wheezes, rales or ronchi noted.  Neurological: Alert and oriented.  Psychiatric: Mood and affect better than before. He seems calmer, less anxious, not tearful today.  BMET No results found for: NA, K, CL, CO2, GLUCOSE, BUN, CREATININE, CALCIUM, GFRNONAA, GFRAA  Lipid Panel     Component Value Date/Time   CHOL 185 12/12/2014 0857   TRIG 160* 12/12/2014 0857   HDL 30* 12/12/2014 0857   CHOLHDL 6.2* 12/12/2014 0857   LDLCALC 123* 12/12/2014 0857    CBC No results found for: WBC, RBC, HGB, HCT, PLT, MCV, MCH, MCHC, RDW, LYMPHSABS, MONOABS, EOSABS, BASOSABS  Hgb A1C No results found for: HGBA1C  Assessment & Plan:

## 2015-05-06 ENCOUNTER — Other Ambulatory Visit: Payer: Self-pay | Admitting: Internal Medicine

## 2015-05-06 NOTE — Telephone Encounter (Signed)
Is pt suppose to continue medication or will he need to follow up first--please advise

## 2015-05-08 NOTE — Telephone Encounter (Signed)
Should continue

## 2015-08-04 ENCOUNTER — Other Ambulatory Visit: Payer: Self-pay | Admitting: Internal Medicine

## 2015-08-04 NOTE — Telephone Encounter (Signed)
Yes, this will likely be long term

## 2015-08-04 NOTE — Telephone Encounter (Signed)
Okay to refill? Will this be an ongoing Rx? Please advise

## 2015-08-08 ENCOUNTER — Telehealth: Payer: Self-pay | Admitting: Internal Medicine

## 2015-08-08 ENCOUNTER — Ambulatory Visit: Payer: Medicare HMO | Admitting: Internal Medicine

## 2015-08-08 NOTE — Telephone Encounter (Signed)
Yes he needs to follow up 

## 2015-08-08 NOTE — Telephone Encounter (Signed)
Pt did not come in for their appt today for 6 month follow up. Please let me know if pt needs to be contacted immediately for follow up or no follow up needed. Best phone number to contact pt is [phone].

## 2015-08-09 NOTE — Telephone Encounter (Signed)
Spoke with patient, rescheduled follow up. Patient is aware

## 2015-08-14 ENCOUNTER — Ambulatory Visit (INDEPENDENT_AMBULATORY_CARE_PROVIDER_SITE_OTHER): Payer: Medicare HMO | Admitting: Internal Medicine

## 2015-08-14 ENCOUNTER — Encounter: Payer: Self-pay | Admitting: Internal Medicine

## 2015-08-14 VITALS — BP 122/80 | HR 80 | Temp 98.1°F | Wt 149.0 lb

## 2015-08-14 DIAGNOSIS — G4700 Insomnia, unspecified: Secondary | ICD-10-CM | POA: Diagnosis not present

## 2015-08-14 DIAGNOSIS — F32A Depression, unspecified: Secondary | ICD-10-CM

## 2015-08-14 DIAGNOSIS — F329 Major depressive disorder, single episode, unspecified: Secondary | ICD-10-CM

## 2015-08-14 MED ORDER — TRAZODONE HCL 50 MG PO TABS: 25.0000 mg | ORAL_TABLET | Freq: Every evening | ORAL | Status: DC | PRN

## 2015-08-14 MED ORDER — PAROXETINE HCL 20 MG PO TABS: 20.0000 mg | ORAL_TABLET | Freq: Every day | ORAL | Status: DC

## 2015-08-14 NOTE — Progress Notes (Signed)
Subjective:    Patient ID: Wesley [name], male    DOB: May 23, 1946, 69 y.o.   MRN: [id number]  HPI  Pt presents to the clinic today for 6 month follow up of chronic conditions.  Depression: He does not feel like the Remeron helps. He still feels down and depressed. He had a terrible childhood and can not seem to get past it. He has seen a therapist in the past and reports it "didn't go well". He is not interested in seeing a different therapist. He denies SI/HI.  Insomnia: He reports he can not fall asleep or stay asleep. Sometimes he does not go to bed until 7 am. He can not turn his mind off. He reports the Remeron did not help him sleep at night.  Review of Systems      History reviewed. No pertinent past medical history.  Current Outpatient Prescriptions  Medication Sig Dispense Refill  . aspirin 81 MG tablet Take 81 mg by mouth 2 (two) times daily.     Marland Kitchen PARoxetine (PAXIL) 20 MG tablet Take 1 tablet (20 mg total) by mouth daily. 30 tablet 2  . traZODone (DESYREL) 50 MG tablet Take 0.5-1 tablets (25-50 mg total) by mouth at bedtime as needed for sleep. 30 tablet 2   No current facility-administered medications for this visit.    No Known Allergies  Family History  Problem Relation Age of Onset  . Cancer Mother     Kidney  . Diabetes Mother   . Cancer Paternal Uncle     colon  . Hyperlipidemia Neg Hx   . Hypertension Neg Hx   . Stroke Neg Hx     Social History   Social History  . Marital Status: Widowed    Spouse Name: N/A  . Number of Children: N/A  . Years of Education: N/A   Occupational History  . Not on file.   Social History Main Topics  . Smoking status: Current Every Day Smoker -- 1.00 packs/day for 56 years    Types: Cigarettes  . Smokeless tobacco: Never Used  . Alcohol Use: No  . Drug Use: No  . Sexual Activity: Not Currently   Other Topics Concern  . Not on file   Social History Narrative     Constitutional: Denies fever,  malaise, fatigue, headache or abrupt weight changes.  Respiratory: Denies difficulty breathing, shortness of breath, cough or sputum production.   Cardiovascular: Denies chest pain, chest tightness, palpitations or swelling in the hands or feet.  Neurological: Pt reports insomnia. Denies dizziness, difficulty with memory, difficulty with speech or problems with balance and coordination.  Psych: Pt reports depression. Denies anxiety, SI/HI.  No other specific complaints in a complete review of systems (except as listed in HPI above).  Objective:   Physical Exam   BP 122/80 mmHg  Pulse 80  Temp(Src) 98.1 F (36.7 C) (Oral)  Wt 149 lb (67.586 kg)  SpO2 97% Wt Readings from Last 3 Encounters:  08/14/15 149 lb (67.586 kg)  02/03/15 153 lb (69.4 kg)  01/04/15 150 lb (68.04 kg)    General: Appears his stated age, well developed, well nourished in NAD. Cardiovascular: Normal rate and rhythm. S1,S2 noted.  No murmur, rubs or gallops noted.  Pulmonary/Chest: Normal effort and positive vesicular breath sounds. No respiratory distress. No wheezes, rales or ronchi noted.  Neurological: Alert and oriented.  Psychiatric: Affect is flat. He engages with simple answers. He avoids eye contact.    BMET  Component Value Date/Time   NA 140 11/12/2014 1149   K 4.2 11/12/2014 1149   CL 107 11/12/2014 1149   CO2 28 11/12/2014 1149   GLUCOSE 93 11/12/2014 1149   BUN 11 11/12/2014 1149   CREATININE 0.88 11/12/2014 1149   CALCIUM 8.2* 11/12/2014 1149   GFRNONAA >60 11/12/2014 1149   GFRAA >60 11/12/2014 1149    Lipid Panel     Component Value Date/Time   CHOL 185 12/12/2014 0857   TRIG 160* 12/12/2014 0857   HDL 30* 12/12/2014 0857   CHOLHDL 6.2* 12/12/2014 0857   LDLCALC 123* 12/12/2014 0857    CBC    Component Value Date/Time   WBC 10.9* 11/12/2014 1127   RBC 5.19 11/12/2014 1127   HGB 16.1 11/12/2014 1127   HCT 48.2 11/12/2014 1127   PLT 358 11/12/2014 1127   MCV 93  11/12/2014 1127   MCH 31.1 11/12/2014 1127   MCHC 33.5 11/12/2014 1127   RDW 13.7 11/12/2014 1127    Hgb A1C No results found for: HGBA1C      Assessment & Plan:

## 2015-08-14 NOTE — Assessment & Plan Note (Signed)
Stop Remeron Will start Trazadone 50 mg every night  Let me know if 4 weeks if this not helping

## 2015-08-14 NOTE — Assessment & Plan Note (Signed)
Moderate but stable Stop Remeron Start Paxil 20 mg daily  RTC in 1 month to follow up depression/insomnia

## 2015-08-14 NOTE — Patient Instructions (Signed)

## 2015-08-20 ENCOUNTER — Other Ambulatory Visit: Payer: Self-pay | Admitting: Internal Medicine

## 2017-10-21 ENCOUNTER — Ambulatory Visit
Admission: RE | Admit: 2017-10-21 | Discharge: 2017-10-21 | Disposition: A | Discharge status: Home / Self Care | Payer: Medicare HMO | Source: Ambulatory Visit | Attending: Gastroenterology | Admitting: Gastroenterology

## 2017-10-21 ENCOUNTER — Other Ambulatory Visit: Payer: Self-pay | Admitting: Gastroenterology

## 2017-10-21 DIAGNOSIS — R933 Abnormal findings on diagnostic imaging of other parts of digestive tract: Secondary | ICD-10-CM

## 2017-10-21 DIAGNOSIS — C20 Malignant neoplasm of rectum: Secondary | ICD-10-CM

## 2017-10-21 DIAGNOSIS — C801 Malignant (primary) neoplasm, unspecified: Secondary | ICD-10-CM

## 2017-10-21 HISTORY — DX: Malignant (primary) neoplasm, unspecified: C80.1

## 2017-10-21 MED ORDER — IOPAMIDOL (ISOVUE-300) INJECTION 61%: 100.0000 mL | Freq: Once | INTRAVENOUS | Status: DC | PRN

## 2017-10-22 ENCOUNTER — Other Ambulatory Visit: Payer: Self-pay | Admitting: Gastroenterology

## 2017-10-31 ENCOUNTER — Encounter (HOSPITAL_COMMUNITY)
Admission: RE | Disposition: A | Discharge status: Home / Self Care | Payer: Self-pay | Source: Ambulatory Visit | Attending: Gastroenterology

## 2017-10-31 ENCOUNTER — Other Ambulatory Visit: Payer: Self-pay

## 2017-10-31 ENCOUNTER — Ambulatory Visit (HOSPITAL_COMMUNITY)
Admission: RE | Admit: 2017-10-31 | Discharge: 2017-10-31 | Disposition: A | Discharge status: Home / Self Care | Payer: Medicare HMO | Source: Ambulatory Visit | Attending: Gastroenterology | Admitting: Gastroenterology

## 2017-10-31 ENCOUNTER — Encounter (HOSPITAL_COMMUNITY): Payer: Self-pay | Admitting: *Deleted

## 2017-10-31 DIAGNOSIS — F17210 Nicotine dependence, cigarettes, uncomplicated: Secondary | ICD-10-CM | POA: Diagnosis not present

## 2017-10-31 DIAGNOSIS — C20 Malignant neoplasm of rectum: Secondary | ICD-10-CM | POA: Diagnosis present

## 2017-10-31 HISTORY — PX: EUS: SHX5427

## 2017-10-31 SURGERY — ULTRASOUND, LOWER GI TRACT, ENDOSCOPIC
Anesthesia: Moderate Sedation

## 2017-10-31 MED ORDER — FENTANYL CITRATE (PF) 100 MCG/2ML IJ SOLN
INTRAMUSCULAR | Status: AC
  Filled 2017-10-31: qty 2

## 2017-10-31 MED ORDER — SPOT INK MARKER SYRINGE KIT
PACK | SUBMUCOSAL | Status: DC | PRN
  Administered 2017-10-31: 3.5 mL via SUBMUCOSAL

## 2017-10-31 MED ORDER — FENTANYL CITRATE (PF) 100 MCG/2ML IJ SOLN
INTRAMUSCULAR | Status: DC | PRN
  Administered 2017-10-31 (×2): 25 ug via INTRAVENOUS

## 2017-10-31 MED ORDER — SODIUM CHLORIDE 0.9 % IV SOLN: INTRAVENOUS | Status: DC

## 2017-10-31 MED ORDER — SPOT INK MARKER SYRINGE KIT
PACK | SUBMUCOSAL | Status: AC
  Filled 2017-10-31: qty 5

## 2017-10-31 MED ORDER — MIDAZOLAM HCL 10 MG/2ML IJ SOLN
INTRAMUSCULAR | Status: DC | PRN
  Administered 2017-10-31 (×2): 1 mg via INTRAVENOUS
  Administered 2017-10-31: 2 mg via INTRAVENOUS

## 2017-10-31 MED ORDER — MIDAZOLAM HCL 5 MG/ML IJ SOLN
INTRAMUSCULAR | Status: AC
  Filled 2017-10-31: qty 2

## 2017-10-31 MED ORDER — DIPHENHYDRAMINE HCL 50 MG/ML IJ SOLN
INTRAMUSCULAR | Status: AC
  Filled 2017-10-31: qty 1

## 2017-10-31 NOTE — Discharge Instructions (Signed)
Moderate Conscious Sedation, Adult, Care After These instructions provide you with information about caring for yourself after your procedure. Your health care provider may also give you more specific instructions. Your treatment has been planned according to current medical practices, but problems sometimes occur. Call your health care provider if you have any problems or questions after your procedure. What can I expect after the procedure? After your procedure, it is common:  To feel sleepy for several hours.  To feel clumsy and have poor balance for several hours.  To have poor judgment for several hours.  To vomit if you eat too soon.  Follow these instructions at home: For at least 24 hours after the procedure:   Do not: ? Participate in activities where you could fall or become injured. ? Drive. ? Use heavy machinery. ? Drink alcohol. ? Take sleeping pills or medicines that cause drowsiness. ? Make important decisions or sign legal documents. ? Take care of children on your own.  Rest. Eating and drinking  Follow the diet recommended by your health care provider.  If you vomit: ? Drink water, juice, or soup when you can drink without vomiting. ? Make sure you have little or no nausea before eating solid foods. General instructions  Have a responsible adult stay with you until you are awake and alert.  Take over-the-counter and prescription medicines only as told by your health care provider.  If you smoke, do not smoke without supervision.  Keep all follow-up visits as told by your health care provider. This is important. Contact a health care provider if:  You keep feeling nauseous or you keep vomiting.  You feel light-headed.  You develop a rash.  You have a fever. Get help right away if:  You have trouble breathing. This information is not intended to replace advice given to you by your health care provider. Make sure you discuss any questions you have  with your health care provider. Document Released: 09/08/2013 Document Revised: 04/22/2016 Document Reviewed: 03/09/2016 Elsevier Interactive Patient Education  2018 [place] HAD AN ENDOSCOPIC PROCEDURE TODAY: Refer to the procedure report and other information in the discharge instructions given to you for any specific questions about what was found during the examination. If this information does not answer your questions, please call [facility] at [phone] to clarify.   YOU SHOULD EXPECT: Some feelings of bloating in the abdomen. Passage of more gas than usual. Walking can help get rid of the air that was put into your GI tract during the procedure and reduce the bloating. If you had a lower endoscopy (such as a colonoscopy or flexible sigmoidoscopy) you may notice spotting of blood in your stool or on the toilet paper. Some abdominal soreness may be present for a day or two, also.  DIET: Your first meal following the procedure should be a light meal and then it is ok to progress to your normal diet. A half-sandwich or bowl of soup is an example of a good first meal. Heavy or fried foods are harder to digest and may make you feel nauseous or bloated. Drink plenty of fluids but you should avoid alcoholic beverages for 24 hours. If you had an esophageal dilation, please see attached information for diet.   ACTIVITY: Your care partner should take you home directly after the procedure. You should plan to take it easy, moving slowly for the rest of the day. You can resume normal activity the day after the  procedure however YOU SHOULD NOT DRIVE, use power tools, machinery or perform tasks that involve climbing or major physical exertion for 24 hours (because of the sedation medicines used during the test).   SYMPTOMS TO REPORT IMMEDIATELY: A gastroenterologist can be reached at any hour. Please call [phone]  for any of the following symptoms:  Following lower endoscopy  (colonoscopy, flexible sigmoidoscopy) Excessive amounts of blood in the stool  Significant tenderness, worsening of abdominal pains  Swelling of the abdomen that is new, acute  Fever of 100 or higher    FOLLOW UP:  If any biopsies were taken you will be contacted by phone or by letter within the next 1-3 weeks. Call [phone]  if you have not heard about the biopsies in 3 weeks.  Please also call with any specific questions about appointments or follow up tests.

## 2017-10-31 NOTE — H&P (Signed)
Wesley [name] HPI: The patient reports having a one year history of hematochezia.  Further evaluation with a colonoscopy last week revealed a large rectal adenocarcinoma.  There was no metastases and his CEA was at 396.  History reviewed. No pertinent past medical history.  Past Surgical History:  Procedure Laterality Date  . CHOLECYSTECTOMY      Family History  Problem Relation Age of Onset  . Cancer Mother        Kidney  . Diabetes Mother   . Cancer Paternal Uncle        colon  . Hyperlipidemia Neg Hx   . Hypertension Neg Hx   . Stroke Neg Hx     Social History:  reports that he has been smoking cigarettes.  He has a 56.00 pack-year smoking history. he has never used smokeless tobacco. He reports that he does not drink alcohol or use drugs.  Allergies: No Known Allergies  Medications:  Scheduled:  Continuous: . sodium chloride      No results found for this or any previous visit (from the past 24 hour(s)).   No results found.  ROS:  As stated above in the HPI otherwise negative.  Blood pressure 131/64, pulse 78, temperature 98 F (36.7 C), temperature source Oral, resp. rate (!) 21, height 5\' 6"  (1.676 m), weight 67.6 kg (149 lb), SpO2 97 %.    PE: Gen: NAD, Alert and Oriented HEENT:  [place]/AT, EOMI Neck: Supple, no LAD Lungs: CTA Bilaterally CV: RRR without M/G/R ABM: Soft, NTND, +BS Ext: No C/C/E  Assessment/Plan: 1) Rectal adenocarcinoma - EUS for local staging.  Wesley [name] D 10/31/2017, 1:34 PM

## 2017-10-31 NOTE — Op Note (Addendum)
[facility] Patient Name: Wesley [name] Procedure Date: 10/31/2017 MRN: [id number] Attending MD: Carol Ada , MD Date of Birth: 02/15/1946 CSN: [id number] Age: 71 Admit Type: Outpatient Procedure:                Lower EUS Indications:              Rectal mucosal mass found on flex sig/colonoscopy Providers:                Carol Ada, MD, Cleda Daub, RN, Cletis Athens,                            Technician Referring MD:              Medicines:                Midazolam 4 mg IV, Fentanyl 50 micrograms IV Complications:            No immediate complications. Estimated Blood Loss:     Estimated blood loss was minimal. Procedure:                Pre-Anesthesia Assessment:                           - Prior to the procedure, a History and Physical                            was performed, and patient medications and                            allergies were reviewed. The patient's tolerance of                            previous anesthesia was also reviewed. The risks                            and benefits of the procedure and the sedation                            options and risks were discussed with the patient.                            All questions were answered, and informed consent                            was obtained. Prior Anticoagulants: The patient has                            taken no previous anticoagulant or antiplatelet                            agents. ASA Grade Assessment: III - A patient with                            severe systemic disease. After reviewing the risks  and benefits, the patient was deemed in                            satisfactory condition to undergo the procedure.                           - Sedation was administered by an endoscopy nurse.                            The sedation level attained was moderate.                           After obtaining informed consent, the endoscope was                  passed under direct vision. Throughout the                            procedure, the patient's blood pressure, pulse, and                            oxygen saturations were monitored continuously. The                            [id number] ([id number]) scope was introduced through                            the anus and advanced to the the sigmoid colon for                            ultrasound. The lower EUS was accomplished without                            difficulty. The patient tolerated the procedure                            well. The quality of the bowel preparation was                            adequate. Scope In: Scope Out: Findings:      Endosonographic Finding :      A hypoechoic mass was found in the rectum. The mass was encountered at 5       cm (from the anal verge). The mass was partially circumferential       (involving 50% of the lumen). The endosonographic borders were       poorly-defined and irregular. The mass measured 30 mm (in maximum       length) by 13 mm (in maximum thickness). There was sonographic evidence       suggesting breakthrough of the muscularis propria with invasion into the       perirectal fat (uT3, manifested by prominent pseudopodia). There was no       sonographic evidence of invasion into the adjacent structures. Area was       tattooed with an injection of 3 mL of Spot (carbon black) both       proximally and  distally. I was not able to identify any significant       lymph nodes. Impression:               - Rectal mass was visualized endosonographically. A                            tissue diagnosis was obtained prior to this exam.                            This is of adenocarcinoma. This was staged uT3 uN0.                            Tattooed.                           - No specimens collected. Moderate Sedation:      Moderate (conscious) sedation was administered by the endoscopy nurse       and supervised by the  endoscopist. The following parameters were       monitored: oxygen saturation, heart rate, blood pressure, and response       to care. Recommendation:           - Patient has a contact number available for                            emergencies. The signs and symptoms of potential                            delayed complications were discussed with the                            patient. Return to normal activities tomorrow.                            Written discharge instructions were provided to the                            patient.                           - Resume previous diet.                           - Referral to Oncology and Surgery. Procedure Code(s):        --- Professional ---                           [phone], Sigmoidoscopy, flexible; with endoscopic                            ultrasound examination                           [id number], Sigmoidoscopy, flexible; with directed                            submucosal injection(s), any substance  Diagnosis Code(s):        --- Professional ---                           C20, Malignant neoplasm of rectum                           K62.89, Other specified diseases of anus and rectum CPT copyright 2016 American Medical Association. All rights reserved. The codes documented in this report are preliminary and upon coder review may  be revised to meet current compliance requirements. Carol Ada, MD Carol Ada, MD 10/31/2017 3:28:44 PM This report has been signed electronically. Number of Addenda: 0

## 2017-11-03 ENCOUNTER — Encounter (HOSPITAL_COMMUNITY): Payer: Self-pay | Admitting: Gastroenterology

## 2017-11-07 ENCOUNTER — Telehealth: Payer: Self-pay | Admitting: Nurse Practitioner

## 2017-11-07 ENCOUNTER — Ambulatory Visit (HOSPITAL_BASED_OUTPATIENT_CLINIC_OR_DEPARTMENT_OTHER): Payer: Medicare HMO | Admitting: Nurse Practitioner

## 2017-11-07 ENCOUNTER — Encounter: Payer: Self-pay | Admitting: Nurse Practitioner

## 2017-11-07 VITALS — BP 132/73 | HR 74 | Temp 98.5°F | Resp 17 | Ht 66.0 in | Wt 135.9 lb

## 2017-11-07 DIAGNOSIS — R110 Nausea: Secondary | ICD-10-CM

## 2017-11-07 DIAGNOSIS — R634 Abnormal weight loss: Secondary | ICD-10-CM

## 2017-11-07 DIAGNOSIS — C20 Malignant neoplasm of rectum: Secondary | ICD-10-CM | POA: Diagnosis not present

## 2017-11-07 DIAGNOSIS — R197 Diarrhea, unspecified: Secondary | ICD-10-CM

## 2017-11-07 HISTORY — DX: Malignant neoplasm of rectum: C20

## 2017-11-07 MED ORDER — DIPHENOXYLATE-ATROPINE 2.5-0.025 MG PO TABS: 1.0000 | ORAL_TABLET | Freq: Four times a day (QID) | ORAL | 0 refills | Status: DC | PRN

## 2017-11-07 MED ORDER — ONDANSETRON HCL 8 MG PO TABS: 8.0000 mg | ORAL_TABLET | Freq: Three times a day (TID) | ORAL | 0 refills | Status: DC | PRN

## 2017-11-07 NOTE — Progress Notes (Signed)
  Oncology Nurse Navigator Documentation  Navigator Location: [facility] (11/07/17 1521) Referral date to RadOnc/MedOnc: 11/03/17 (11/07/17 1521) )Navigator Encounter Type: Initial MedOnc (11/07/17 1521)   Abnormal Finding Date: 10/21/17 (11/07/17 1521) Confirmed Diagnosis Date: 10/22/17 (11/07/17 1521)               Patient Visit Type: MedOnc;Initial (11/07/17 1521) Treatment Phase: Pre-Tx/Tx Discussion (11/07/17 1521) Barriers/Navigation Needs: No barriers at this time (11/07/17 1521)   Interventions: Psycho-social support (11/07/17 1521)    I met with patient, sister and patient's daughter at patient's initial med-onc appointment. I introduced myself and my role as GI Navigator and provided my contact information. I reviewed the different members of his treatment team. Patient verbalized understanding that he can call with questions or concerns.        Acuity: Level 2 (11/07/17 1521)   Acuity Level 2: Educational needs;Ongoing guidance and education throughout treatment as needed (11/07/17 1521)     Time Spent with Patient: 15 (11/07/17 1521)

## 2017-11-07 NOTE — Telephone Encounter (Signed)
Scheduled appt per 12/7 los - Gave patient aVS and calender per los. Central radiology to contact patient with ct schedule.

## 2017-11-07 NOTE — Progress Notes (Addendum)
Wesley [name]  [phone] Fax:([phone]) [place] Note   Patient Care Team: Jearld Fenton, NP as PCP - General (Internal Medicine) Carol Ada, MD as Consulting Physician (Gastroenterology) Truitt Merle, MD as Consulting Physician (Hematology) Alla Feeling, NP as Nurse Practitioner (Nurse Practitioner) 11/09/2017   CHIEF COMPLAINTS/PURPOSE OF CONSULTATION:  Newly diagnosed rectal cancer   REFERRING PHYSICIAN: Carol Ada, MD    Rectal adenocarcinoma [facility])   10/21/2017 Procedure    COLONOSCOPY per Dr. Carol Ada Findings: A fungating, infiltrative and ulcerated non--obstructing large mass was found in the rectum.  The mass was circumferential.  The mass measured 3 cm in length.  Oozing was present.  This was biopsied with a cold snare for histology.  2 sessile polyps were found in the transverse colon and ascending colon.  The polyps were 3-4 mm in size.  The polyps were removed with a cold snare.  Section and retrieval were complete.  A 15 mm polyp was found in the sigmoid colon.  The polyp was semi-pedunculated.  The polyp was removed with a hot snare.  Resection and retrieval were complete  The mass was extremely friable and palpable with a rectal examination.  The distal extent was 5 cm from the dentate line and it was 3 cm in length.  Specimens were obtained using a cold snare.  Retroflexion was not possible with the close proximity of the lesion in the rectum.         10/21/2017 Initial Biopsy    Final microscopic diagnosis A.  Colon, ascending, polyp: Tubular adenoma No high-grade dysplasia or malignancy  B.  Colon, transverse, polyp: Tubular adenoma No high-grade dysplasia or malignancy  C.  Colon, sigmoid, polyp: Tubular adenoma with high-grade dysplasia and mucosal prolapse  D.  Rectum, mass, biopsy: Invasive adenocarcinoma, moderately differentiated Immunohistochemical stains for ML H1, [place] 2, [place] 6 and PMS  2 are intact (normal)  There is no evidence of DNA mismatch repair deficiency in the carcinoma, indicating that the tumor is most likely microsatellite stable, and that Lynch syndrome (a common cause of hereditary non-polyposis colorectal cancer or HNPCC) is very unlikely      10/21/2017 Imaging    CT A/P W CONTRAST IMPRESSION: 1. Irregular mural and mucosal thickening of the rectum with small right 9 mm short axis index lymph node adjacent to the rectum. 2. Diffuse mild to moderate fluid-filled distention of small bowel loops query ileus or dysmotility. No mechanical source obstruction is seen. 3. Submucosal fatty infiltration of the descending and sigmoid colon possibly representing stigmata of chronic inflammatory bowel disease.       10/21/2017 Initial Diagnosis    Rectal adenocarcinoma ([place])      10/31/2017 Procedure    EUS: Findings: A hypoechoic mass was found in the rectum. The mass was encountered at 5 cm (from the anal verge). The mass was partially circumferential (involving 50% of the lumen). The endosonographic borders were poorly-defined and irregular. The mass measured 30 mm (in maximum length) by 13 mm (in maximum thickness). There was sonographic evidence suggesting breakthrough of the muscularis propria with invasion into the perirectal fat (uT3, manifested by prominent pseudopodia). There was no sonographic evidence of invasion into the adjacent structures. Area was tattooed with an injection of 3 mL of Spot (carbon black) both proximally and distally. I was not able to identify any significant lymph nodes. - Rectal mass was visualized endosonographically. A tissue diagnosis was obtained prior to this exam. This  is of adenocarcinoma. This was staged uT3 uN0. Tattooed. - No specimens collected.      HISTORY OF PRESENTING ILLNESS:  Wesley [name] 71 y.o. male is here because of newly diagnosed rectal cancer. He reports intermittent painless rectal bleeding  with associated abdominal pain, diarrhea, nausea, fatigue, and 25 pounds weight loss for 1 year. reported usual weight is 160 lbs. He attributed rectal bleeding to hemorrhoids and delayed seeking medical care because he is distrustful of the health care system. He does not have large appetite in general, eats 1 meal per day at baseline. Denies bloating, cramping, dysphagia, emesis, or early satiety. He presented to medical doctor who ordered colonoscopy which found a fungating, infiltrative, and ulcerated non-obstructing rectal mass, 2 sessile polyps in the transverse and ascending colon and 1 semi-pedunculated polyp in the sigmoid colon. The rectal mass was palpable on digital rectal exam. He then had CT abdomen and pelvis which showed irregular mural and mucosal thickening of the rectum with small right 9 mm short axis index lymph node adjacent to the rectum. EUS confirmed sonographic evidence suggesting breakthrough of the muscularis proprioa with invasion into the perirectal fat; there was no evidence of invasion into adjacent structures; there were no identifiable significant lymph nodes.   In the past he has never had a colonoscopy. He has past medical history anxiety, depression, insomnia, hyperlipidemia, and tobacco use. He has smoked 1 PPD for 56 years. Denies alcohol or other drug use. He is widowed, lives alone, has 3 adult children who are healthy. One daughter and his sister accompany him to consult. This daughter lives approx 20 minutes away, sister lives 1 hour away. He drives dump truck. He is independent with ADL's.   Today he continues to report painless rectal bleeding, intermittent nausea and diarrhea. Imodium has not improved much. He is moderately fatigued but continues to work. He is willing to consider treatment but does not want to be a burden on his family.    MEDICAL HISTORY:  Past Medical History:  Diagnosis Date  . Rectal adenocarcinoma ([place]) 11/07/2017    SURGICAL  HISTORY: Past Surgical History:  Procedure Laterality Date  . CHOLECYSTECTOMY    . EUS N/A 10/31/2017   Procedure: LOWER ENDOSCOPIC ULTRASOUND (EUS);  Surgeon: Carol Ada, MD;  Location: [facility];  Service: Endoscopy;  Laterality: N/A;    SOCIAL HISTORY: Social History   Socioeconomic History  . Marital status: Widowed    Spouse name: Not on file  . Number of children: 3  . Years of education: Not on file  . Highest education level: Not on file  Social Needs  . Financial resource strain: Not on file  . Food insecurity - worry: Not on file  . Food insecurity - inability: Not on file  . Transportation needs - medical: Not on file  . Transportation needs - non-medical: Not on file  Occupational History  . Occupation: truck Geophysicist/field seismologist  Tobacco Use  . Smoking status: Current Every Day Smoker    Packs/day: 1.00    Years: 56.00    Pack years: 56.00    Types: Cigarettes  . Smokeless tobacco: Never Used  Substance and Sexual Activity  . Alcohol use: No    Alcohol/week: 0.0 oz  . Drug use: No  . Sexual activity: Not Currently  Other Topics Concern  . Not on file  Social History Narrative  . Not on file    FAMILY HISTORY: Family History  Problem Relation Age of Onset  .  Cancer Mother        Kidney  . Diabetes Mother   . Cancer Paternal Uncle        colon  . Cancer Paternal Aunt 41       breast cancer  . Hyperlipidemia Neg Hx   . Hypertension Neg Hx   . Stroke Neg Hx     ALLERGIES:  has No Known Allergies.  MEDICATIONS:  Current Outpatient Medications  Medication Sig Dispense Refill  . diphenoxylate-atropine (LOMOTIL) 2.5-0.025 MG tablet Take 1 tablet by mouth 4 (four) times daily as needed for diarrhea or loose stools. 30 tablet 0  . ondansetron (ZOFRAN) 8 MG tablet Take 1 tablet (8 mg total) by mouth every 8 (eight) hours as needed for nausea or vomiting. 20 tablet 0   No current facility-administered medications for this visit.     REVIEW OF SYSTEMS:    Constitutional: Denies fevers, chills or abnormal night sweats (+) low appetite at baseline (+) 25 lbs weight loss over 1 year (+) moderate fatigue but continues to work.  Eyes: Denies blurriness of vision, double vision or watery eyes Ears, nose, mouth, throat, and face: Denies mucositis or sore throat Respiratory: Denies dyspnea or wheezes (+) "smoker's cough" Cardiovascular: Denies palpitation, chest discomfort or lower extremity swelling Gastrointestinal:  Denies abdominal pain, bloating, cramping, early satiety, constipation, emesis, heartburn (+) intermittent diarrhea x1 year, no improvement on imodium (+) intermittent nausea x1 year (+) painless rectal bleeding x1 year  Skin: Denies abnormal skin rashes Lymphatics: Denies new lymphadenopathy or easy bruising Neurological:Denies numbness, tingling or new weaknesses  Behavioral/Psych: Mood is stable, no new changes (+) depression (+) anxiety All other systems were reviewed with the patient and are negative.  PHYSICAL EXAMINATION: ECOG PERFORMANCE STATUS: 1 - Symptomatic but completely ambulatory  Vitals:   11/07/17 1416  BP: 132/73  Pulse: 74  Resp: 17  Temp: 98.5 F (36.9 C)  SpO2: 99%   Filed Weights   11/07/17 1416  Weight: 135 lb 14.4 oz (61.6 kg)    GENERAL:alert, no distress and comfortable SKIN: skin color, texture, turgor are normal, no rashes or significant lesions EYES: normal, conjunctiva are pink and non-injected, sclera clear OROPHARYNX:no exudate, no erythema and lips, buccal mucosa, and tongue normal  NECK: supple, thyroid normal size, non-tender, without nodularity LYMPH:  no palpable cervical, supraclavicular, axillary, or inguinal lymphadenopathy  LUNGS: clear to auscultation bilaterally with normal breathing effort HEART: regular rate & rhythm and no murmurs and no lower extremity edema ABDOMEN:abdomen soft, non-tender and normal bowel sounds. No palpable hepatosplenomegaly Musculoskeletal:no cyanosis  of digits and no clubbing  PSYCH: alert & oriented x 3 with fluent speech NEURO: no focal motor/sensory deficits RECTAL patient refused digital rectal exam  LABORATORY DATA:  I have reviewed the data as listed CBC Latest Ref Rng & Units 11/12/2014  WBC 3.8 - 10.6 x10 3/mm 3 10.9(H)  Hemoglobin 13.0 - 18.0 g/dL 16.1  Hematocrit 40.0 - 52.0 % 48.2  Platelets 150 - 440 x10 3/mm 3 358   CMP Latest Ref Rng & Units 12/12/2014 11/12/2014  Glucose 65 - 99 mg/dL - 93  BUN 7 - 18 mg/dL - 11  Creatinine 0.60 - 1.30 mg/dL - 0.88  Sodium 136 - 145 mmol/L - 140  Potassium 3.5 - 5.1 mmol/L - 4.2  Chloride 98 - 107 mmol/L - 107  CO2 21 - 32 mmol/L - 28  Calcium 8.5 - 10.1 mg/dL - 8.2(L)  Total Protein 6.0 - 8.5 g/dL 6.9 7.1  Total Bilirubin 0.0 - 1.2 mg/dL 0.4 0.3  Alkaline Phos 39 - 117 IU/L 65 70  AST 0 - 40 IU/L 21 25  ALT 0 - 44 IU/L 18 20   10/20/17:  CBC:  WBC 10.1 PLT 280 RBC 5.18 MCH 30.7 MCV 88 HGB 15.9  Cmet: eGFR 86 Creatinine 0.9 Alk phos 84 ALT/AST 8/18 BG 122  CEA:  10/20/17: 396  RADIOGRAPHIC STUDIES: I have personally reviewed the radiological images as listed and agreed with the findings in the report. Ct Abdomen Pelvis W Contrast  Result Date: 10/21/2017 CLINICAL DATA:  Abnormal colonoscopy, rectal cancer. EXAM: CT ABDOMEN AND PELVIS WITH CONTRAST TECHNIQUE: Multidetector CT imaging of the abdomen and pelvis was performed using the standard protocol following bolus administration of intravenous contrast. CONTRAST:  100 cc Isovue-300 COMPARISON:  None. FINDINGS: Lower chest: Normal heart size with dependent atelectasis. No pneumonic consolidation or dominant mass. Hepatobiliary: No space-occupying mass of the liver. No biliary dilatation. Status post cholecystectomy. Pancreas: Normal Spleen: Normal Adrenals/Urinary Tract: Normal bilateral adrenal glands. Symmetric cortical enhancement of the kidneys. Water attenuating 2.3 cm exophytic right interpolar renal cyst.  No hydroureteronephrosis. The urinary bladder is physiologically distended. Stomach/Bowel: Irregular mild mural and mucosal thickening of the rectum. Small 9 mm short axis right perirectal lymph node, image 71/3. The stomach is nondistended. There is normal small bowel rotation. The mild-to-moderate diffuse small bowel fluid-filled distention without mechanical obstruction noted predominantly involving the jejunum through proximal ileum. This may represent a generalized small bowel ileus or dysmotility. Normal-appearing appendix. Contracted descending through sigmoid colon with nonspecific generalized submucosal fatty change. Vascular/Lymphatic: There are tiny subcentimeter short axis pelvic sidewall lymph nodes the pelvis bilaterally. Moderate aortoiliac and branch vessel atherosclerosis without aneurysm. Reproductive: Normal size prostate and seminal vesicles. Other: No ascites. Musculoskeletal: No suspicious osseous lesions. No acute fracture bone destruction. Tiny sclerotic density in the left iliac bone adjacent to the left SI joint measuring 4 mm is identified likely to represent a small bone island. Similar finding in the right proximal femur. IMPRESSION: 1. Irregular mural and mucosal thickening of the rectum with small right 9 mm short axis index lymph node adjacent to the rectum. 2. Diffuse mild to moderate fluid-filled distention of small bowel loops query ileus or dysmotility. No mechanical source obstruction is seen. 3. Submucosal fatty infiltration of the descending and sigmoid colon possibly representing stigmata of chronic inflammatory bowel disease. Electronically Signed   By: Ashley Royalty M.D.   On: 10/21/2017 15:27    ASSESSMENT & PLAN:  Wesley [name] is a 71 year old caucasian male with history of anxiety, depression, tobacco use presenting with painless rectal bleeding, abdominal pain, nausea, diarrhea, fatigue, and 25 lbs weight loss over 1 year.   1. 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 He has what appears to be early stage rectal cancer, but has not had complete staging work up; will arrange CT chest to complete. We discussed standard treatment regimen which includes neoadjuvant chemoradiation with xeloda followed by definitive surgery and possible adjuvant systemic chemo, we discussed potential side effects in detail. The patient is not very interested in treatment, but will consider it. Reviewed possible complications of untreated rectal cancer including but not limited to bowel obstruction, continued rectal bleeding, and pain; he understands. He lives closer to [place] and if he does undergo radiation, might be reasonable to treat him closer to home. Physical exam is unremarkable, patient refused rectal exam. He  has elevated CEA 396 at diagnosis, will continue to monitor. The patient wants time to think about his options. Will hold referral to rad onc for now. CT chest will be done 12/21 and will return for follow up 12/28 to discuss results and plan.   2. Nausea, diarrhea, weight loss He has intermittent nausea, diarrhea, and 25 pounds weight loss over 1 year. He has tried imodium without much relief. I prescribed lomotil to his pharmacy today. For nausea without vomiting, I prescribed zofran PRN. His appetite is low at baseline; eats 1 meal per day. I recommend increasing po intake and placed a referral to dietician.   PLAN: -Lab and CT chest 12/21 -F/u w Dr Burr Medico 12/28  -Prescriptions for lomotil, zofran -Referral to dietician  All questions were answered. The patient knows to call the clinic with any problems, questions or concerns. I spent 45 minutes counseling the patient face to face. The total time spent in the appointment was 55 minutes and more than 50% was on counseling, review of test results, and coordination of care.     Alla Feeling, NP 11/07/2017  Addendum  I have seen the patient, examined  him. I agree with the assessment and and plan and have edited the notes.   Mr Bart is a 71 yo Caucasian male, with past medical history of depression and anxiety, presented with rectal bleeding and was recently diagnosed with rectal cancer. Clinical stage T3N0 by EUS.  Discussed standard treatment, which includes neoadjuvant chemoradiation, followed by surgical resection and adjuvant chemotherapy.  However patient is not interested in any treatment, he has limited social support, lives alone, he does not want to be a burden to his family.  His daughter strongly encouraged him to consider treatment.  I also strongly encouraged him to at least consider palliative radiation, to improve his rectal bleeding and prevent or delay his bowel obstruction secondary to rectal tumor. He agrees to complete a staging CT scan, and return in a few weeks to discuss further.  Truitt Merle  11/07/2017

## 2017-11-09 ENCOUNTER — Encounter: Payer: Self-pay | Admitting: Nurse Practitioner

## 2017-11-18 ENCOUNTER — Telehealth: Payer: Self-pay | Admitting: Hematology

## 2017-11-18 NOTE — Telephone Encounter (Signed)
Spoke to patients aunt regarding upcoming December appointments per 12/13 sch message

## 2017-11-20 NOTE — Progress Notes (Signed)
  Oncology Nurse Navigator Documentation  Navigator Location: [facility] (11/20/17 0941)   )Navigator Encounter Type: Telephone (11/20/17 0941) Telephone: Incoming Call (11/20/17 0941)    Daughter Lattie Haw called to review upcoming appointments and to ask why patient is scheduled to see a dietitian. Educated patient on need for adequate nutrition during chemo treatments.                 Treatment Phase: Pre-Tx/Tx Discussion (11/20/17 0941) Barriers/Navigation Needs: Coordination of Care (11/20/17 0941)                Acuity: Level 2 (11/20/17 0941)   Acuity Level 2: Initial guidance, education and coordination as needed (11/20/17 0941)     Time Spent with Patient: 15 (11/20/17 0941)

## 2017-11-21 ENCOUNTER — Ambulatory Visit: Payer: Medicare HMO | Admitting: Nutrition

## 2017-11-21 ENCOUNTER — Ambulatory Visit (HOSPITAL_COMMUNITY)
Admission: RE | Admit: 2017-11-21 | Discharge: 2017-11-21 | Disposition: A | Discharge status: Home / Self Care | Payer: Medicare HMO | Source: Ambulatory Visit | Attending: Hematology | Admitting: Hematology

## 2017-11-21 ENCOUNTER — Other Ambulatory Visit: Payer: Medicare HMO

## 2017-11-21 ENCOUNTER — Other Ambulatory Visit (HOSPITAL_BASED_OUTPATIENT_CLINIC_OR_DEPARTMENT_OTHER): Payer: Medicare HMO

## 2017-11-21 DIAGNOSIS — I700 Atherosclerosis of aorta: Secondary | ICD-10-CM | POA: Diagnosis not present

## 2017-11-21 DIAGNOSIS — C20 Malignant neoplasm of rectum: Secondary | ICD-10-CM | POA: Insufficient documentation

## 2017-11-21 DIAGNOSIS — J432 Centrilobular emphysema: Secondary | ICD-10-CM | POA: Diagnosis not present

## 2017-11-21 DIAGNOSIS — I2510 Atherosclerotic heart disease of native coronary artery without angina pectoris: Secondary | ICD-10-CM | POA: Diagnosis not present

## 2017-11-21 LAB — CBC WITH DIFFERENTIAL/PLATELET
BASO%: 1.1 % (ref 0.0–2.0)
Basophils Absolute: 0.1 10*3/uL (ref 0.0–0.1)
EOS%: 2 % (ref 0.0–7.0)
Eosinophils Absolute: 0.2 10*3/uL (ref 0.0–0.5)
HCT: 48.5 % (ref 38.4–49.9)
HGB: 16.2 g/dL (ref 13.0–17.1)
LYMPH%: 26.2 % (ref 14.0–49.0)
MCH: 30 pg (ref 27.2–33.4)
MCHC: 33.4 g/dL (ref 32.0–36.0)
MCV: 89.8 fL (ref 79.3–98.0)
MONO#: 0.9 10*3/uL (ref 0.1–0.9)
MONO%: 9.4 % (ref 0.0–14.0)
NEUT%: 61.3 % (ref 39.0–75.0)
NEUTROS ABS: 6 10*3/uL (ref 1.5–6.5)
Platelets: 262 10*3/uL (ref 140–400)
RBC: 5.4 10*6/uL (ref 4.20–5.82)
RDW: 13.6 % (ref 11.0–14.6)
WBC: 9.8 10*3/uL (ref 4.0–10.3)
lymph#: 2.6 10*3/uL (ref 0.9–3.3)

## 2017-11-21 LAB — COMPREHENSIVE METABOLIC PANEL
ALK PHOS: 89 U/L (ref 40–150)
ALT: 17 U/L (ref 0–55)
AST: 19 U/L (ref 5–34)
Albumin: 3.7 g/dL (ref 3.5–5.0)
Anion Gap: 9 mEq/L (ref 3–11)
BILIRUBIN TOTAL: 0.74 mg/dL (ref 0.20–1.20)
BUN: 9.2 mg/dL (ref 7.0–26.0)
CALCIUM: 9.4 mg/dL (ref 8.4–10.4)
CO2: 25 mEq/L (ref 22–29)
CREATININE: 1 mg/dL (ref 0.7–1.3)
Chloride: 103 mEq/L (ref 98–109)
EGFR: >60 mL/min/{1.73_m2} (ref >60)
Glucose: 120 mg/dl (ref 70–140)
Potassium: 4.6 mEq/L (ref 3.5–5.1)
Sodium: 138 mEq/L (ref 136–145)
TOTAL PROTEIN: 7.2 g/dL (ref 6.4–8.3)

## 2017-11-21 NOTE — Progress Notes (Signed)
71 year old male diagnosed with rectal cancer.  He is a patient of Dr. Burr Medico  Past medical history includes hyperlipidemia, tobacco, and depression.  Medications include Lomotil, and Zofran.  Labs were reviewed.  Height: 66 inches. Weight: 135.9 pounds. Usual body weight: About 150 pounds. BMI: 20 193.  Patient eats one meal a day.  He was using oral nutrition supplements however discontinued use of these about one month ago. Patient reports diarrhea on a regular basis but does not like to take medication and has not been taking Lomotil Patient endorses weight loss over the past year. Patient is verbalizing that he does not want to go through treatment.  Nutrition diagnosis:  Unintended weight loss related to poor appetite as evidenced by 9% weight loss from usual body weight.  Intervention: Patient educated to try to add snacks throughout the day consisting of high protein foods. Encouraged increased fluid intake. Recommended patient try to increase oral nutrition supplements to 3 times a day Provided samples and coupons. Provided supportive listening to patient regarding treatment.  Monitoring, evaluation, goals: Patient will work to increase calories and protein to minimize weight loss.  Next visit: To be scheduled as needed.  **Disclaimer: This note was dictated with voice recognition software. Similar sounding words can inadvertently be transcribed and this note may contain transcription errors which may not have been corrected upon publication of note.**

## 2017-11-28 ENCOUNTER — Encounter: Payer: Self-pay | Admitting: Hematology

## 2017-11-28 ENCOUNTER — Ambulatory Visit (HOSPITAL_BASED_OUTPATIENT_CLINIC_OR_DEPARTMENT_OTHER): Payer: Medicare HMO | Admitting: Hematology

## 2017-11-28 ENCOUNTER — Encounter: Payer: Self-pay | Admitting: Medical Oncology

## 2017-11-28 ENCOUNTER — Telehealth: Payer: Self-pay | Admitting: Hematology

## 2017-11-28 VITALS — BP 125/85 | HR 70 | Temp 98.3°F | Resp 18 | Ht 66.0 in | Wt 138.0 lb

## 2017-11-28 DIAGNOSIS — R197 Diarrhea, unspecified: Secondary | ICD-10-CM | POA: Diagnosis not present

## 2017-11-28 DIAGNOSIS — R634 Abnormal weight loss: Secondary | ICD-10-CM | POA: Diagnosis not present

## 2017-11-28 DIAGNOSIS — C20 Malignant neoplasm of rectum: Secondary | ICD-10-CM

## 2017-11-28 DIAGNOSIS — R110 Nausea: Secondary | ICD-10-CM

## 2017-11-28 MED ORDER — CAPECITABINE 500 MG PO TABS: 825.0000 mg/m2 | ORAL_TABLET | Freq: Two times a day (BID) | ORAL | 1 refills | Status: DC

## 2017-11-28 NOTE — Telephone Encounter (Signed)
No additional appts to per 12/28 . Referral to rad onc - will be contacted by rad onc.

## 2017-11-28 NOTE — Progress Notes (Signed)
[facility]  Telephone:([phone]) [phone] [phone]) [phone]  Clinic Follow Up Note   Patient Care Team: Jearld Fenton, NP as PCP - General (Internal Medicine) Carol Ada, MD as Consulting Physician (Gastroenterology) Truitt Merle, MD as Consulting Physician (Hematology) Alla Feeling, NP as Nurse Practitioner (Nurse Practitioner)   Date of Service:  11/28/2017   CHIEF COMPLAINTS:  Follow up rectal cancer    Oncology History   Cancer Staging Rectal adenocarcinoma [facility]) Staging form: Colon and Rectum, AJCC 8th Edition - Clinical stage from 10/31/2017: Stage IIA (cT3, cN0, cM0) - Signed by Truitt Merle, MD on 11/28/2017       Rectal adenocarcinoma ([place])   10/21/2017 Procedure    COLONOSCOPY per Dr. Carol Ada Findings: A fungating, infiltrative and ulcerated non--obstructing large mass was found in the rectum.  The mass was circumferential.  The mass measured 3 cm in length.  Oozing was present.  This was biopsied with a cold snare for histology.  2 sessile polyps were found in the transverse colon and ascending colon.  The polyps were 3-4 mm in size.  The polyps were removed with a cold snare.  Section and retrieval were complete.  A 15 mm polyp was found in the sigmoid colon.  The polyp was semi-pedunculated.  The polyp was removed with a hot snare.  Resection and retrieval were complete  The mass was extremely friable and palpable with a rectal examination.  The distal extent was 5 cm from the dentate line and it was 3 cm in length.  Specimens were obtained using a cold snare.  Retroflexion was not possible with the close proximity of the lesion in the rectum.         10/21/2017 Initial Biopsy    Final microscopic diagnosis A.  Colon, ascending, polyp: Tubular adenoma No high-grade dysplasia or malignancy  B.  Colon, transverse, polyp: Tubular adenoma No high-grade dysplasia or malignancy  C.  Colon, sigmoid, polyp: Tubular adenoma with  high-grade dysplasia and mucosal prolapse  D.  Rectum, mass, biopsy: Invasive adenocarcinoma, moderately differentiated Immunohistochemical stains for ML H1, [place] 2, [place] 6 and PMS 2 are intact (normal)  There is no evidence of DNA mismatch repair deficiency in the carcinoma, indicating that the tumor is most likely microsatellite stable, and that Lynch syndrome (a common cause of hereditary non-polyposis colorectal cancer or HNPCC) is very unlikely      10/21/2017 Imaging    CT A/P W CONTRAST IMPRESSION: 1. Irregular mural and mucosal thickening of the rectum with small right 9 mm short axis index lymph node adjacent to the rectum. 2. Diffuse mild to moderate fluid-filled distention of small bowel loops query ileus or dysmotility. No mechanical source obstruction is seen. 3. Submucosal fatty infiltration of the descending and sigmoid colon possibly representing stigmata of chronic inflammatory bowel disease.       10/21/2017 Initial Diagnosis    Rectal adenocarcinoma ([place])      10/31/2017 Procedure    EUS: Findings: A hypoechoic mass was found in the rectum. The mass was encountered at 5 cm (from the anal verge). The mass was partially circumferential (involving 50% of the lumen). The endosonographic borders were poorly-defined and irregular. The mass measured 30 mm (in maximum length) by 13 mm (in maximum thickness). There was sonographic evidence suggesting breakthrough of the muscularis propria with invasion into the perirectal fat (uT3, manifested by prominent pseudopodia). There was no sonographic evidence of invasion into the adjacent structures. Area was tattooed with an  injection of 3 mL of Spot (carbon black) both proximally and distally. I was not able to identify any significant lymph nodes. - Rectal mass was visualized endosonographically. A tissue diagnosis was obtained prior to this exam. This is of adenocarcinoma. This was staged uT3 uN0. Tattooed. - No specimens  collected.      11/21/2017 Imaging    CT Chest 11/21/17  IMPRESSION: 1. No evidence of thoracic metastatic disease. 2. Moderate centrilobular emphysema. 3. Mild coronary artery and Aortic Atherosclerosis (ICD10-I70.0).       Radiation Therapy    PENDING concurrent chemoradiation at [place]    PENDING concurrent chemoradiation with Xeloda 3 tabs BID       HISTORY OF PRESENTING ILLNESS:  Wesley [name] 71 y.o. male is here because of newly diagnosed rectal cancer. He reports intermittent painless rectal bleeding with associated abdominal pain, diarrhea, nausea, fatigue, and 25 pounds weight loss for 1 year. reported usual weight is 160 lbs. He attributed rectal bleeding to hemorrhoids and delayed seeking medical care because he is distrustful of the health care system. He does not have large appetite in general, eats 1 meal per day at baseline. Denies bloating, cramping, dysphagia, emesis, or early satiety. He presented to medical doctor who ordered colonoscopy which found a fungating, infiltrative, and ulcerated non-obstructing rectal mass, 2 sessile polyps in the transverse and ascending colon and 1 semi-pedunculated polyp in the sigmoid colon. The rectal mass was palpable on digital rectal exam. He then had CT abdomen and pelvis which showed irregular mural and mucosal thickening of the rectum with small right 9 mm short axis index lymph node adjacent to the rectum. EUS confirmed sonographic evidence suggesting breakthrough of the muscularis proprioa with invasion into the perirectal fat; there was no evidence of invasion into adjacent structures; there were no identifiable significant lymph nodes.   In the past he has never had a colonoscopy. He has past medical history anxiety, depression, insomnia, hyperlipidemia, and tobacco use. He has smoked 1 PPD for 56 years. Denies alcohol or other drug use. He is widowed, lives alone, has 3 adult children who are healthy.  One daughter and his sister accompany him to consult. This daughter lives approx 20 minutes away, sister lives 1 hour away. He drives dump truck. He is independent with ADL's.   Today he continues to report painless rectal bleeding, intermittent nausea and diarrhea. Imodium has not improved much. He is moderately fatigued but continues to work. He is willing to consider treatment but does not want to be a burden on his family.    CURRENT THERAPY: PENDING concurrent radiation and Xeloda '1500mg'$  BID on days of radiation to start in about 2 weeks    INTERVAL HISTORY  Wesley [name] is here for a follow up. He presents to the clinic today accompanied by his daughter Kern Reap.  He notes to having occasional bleeding. He denies any pain. His energy is normal but his appetite is still low, but has stable weight. He still does not know what to do treatment wise. His daughter shares concern for a mix up with him getting treatment from 2 different location. His daughter would like to be contacted regarding his medical treatment and any changes. She provided her best contact number.    REVIEW OF SYSTEMS:   Constitutional: Denies fevers, chills or abnormal night sweats (+) low appetite at baseline (+) stable weight   Eyes: Denies blurriness of vision, double vision  or watery eyes Ears, nose, mouth, throat, and face: Denies mucositis or sore throat Respiratory: Denies dyspnea or wheezes (+) "smoker's cough" Cardiovascular: Denies palpitation, chest discomfort or lower extremity swelling Gastrointestinal:  Denies abdominal pain, bloating, cramping, early satiety, constipation, emesis, heartburn  (+) occasional painless rectal bleeding x1 year  Skin: Denies abnormal skin rashes Lymphatics: Denies new lymphadenopathy or easy bruising Neurological:Denies numbness, tingling or new weaknesses  Behavioral/Psych: Mood is stable, no new changes (+) depression (+) anxiety All other systems were reviewed with  the patient and are negative.   MEDICAL HISTORY:  Past Medical History:  Diagnosis Date  . Rectal adenocarcinoma ([place]) 11/07/2017    SURGICAL HISTORY: Past Surgical History:  Procedure Laterality Date  . CHOLECYSTECTOMY    . EUS N/A 10/31/2017   Procedure: LOWER ENDOSCOPIC ULTRASOUND (EUS);  Surgeon: Carol Ada, MD;  Location: [facility];  Service: Endoscopy;  Laterality: N/A;    SOCIAL HISTORY: Social History   Socioeconomic History  . Marital status: Widowed    Spouse name: Not on file  . Number of children: 3  . Years of education: Not on file  . Highest education level: Not on file  Social Needs  . Financial resource strain: Not on file  . Food insecurity - worry: Not on file  . Food insecurity - inability: Not on file  . Transportation needs - medical: Not on file  . Transportation needs - non-medical: Not on file  Occupational History  . Occupation: truck Geophysicist/field seismologist  Tobacco Use  . Smoking status: Current Every Day Smoker    Packs/day: 1.00    Years: 56.00    Pack years: 56.00    Types: Cigarettes  . Smokeless tobacco: Never Used  Substance and Sexual Activity  . Alcohol use: No    Alcohol/week: 0.0 oz  . Drug use: No  . Sexual activity: Not Currently  Other Topics Concern  . Not on file  Social History Narrative  . Not on file    FAMILY HISTORY: Family History  Problem Relation Age of Onset  . Cancer Mother        Kidney  . Diabetes Mother   . Cancer Paternal Uncle        colon  . Cancer Paternal Aunt 52       breast cancer  . Hyperlipidemia Neg Hx   . Hypertension Neg Hx   . Stroke Neg Hx     ALLERGIES:  has No Known Allergies.  MEDICATIONS:  Current Outpatient Medications  Medication Sig Dispense Refill  . diphenoxylate-atropine (LOMOTIL) 2.5-0.025 MG tablet Take 1 tablet by mouth 4 (four) times daily as needed for diarrhea or loose stools. 30 tablet 0  . ondansetron (ZOFRAN) 8 MG tablet Take 1 tablet (8 mg total) by mouth every 8  (eight) hours as needed for nausea or vomiting. 20 tablet 0  . capecitabine (XELODA) 500 MG tablet Take 3 tablets (1,500 mg total) by mouth 2 (two) times daily after a meal. Take Monday through Friday when you on radiation treatment 90 tablet 1   No current facility-administered medications for this visit.     PHYSICAL EXAMINATION: ECOG PERFORMANCE STATUS: 1 - Symptomatic but completely ambulatory  Vitals:   11/28/17 1538  BP: 125/85  Pulse: 70  Resp: 18  Temp: 98.3 F (36.8 C)  SpO2: 98%   Filed Weights   11/28/17 1538  Weight: 138 lb (62.6 kg)    GENERAL:alert, no distress and comfortable SKIN: skin color, texture, turgor are normal,  no rashes or significant lesions EYES: normal, conjunctiva are pink and non-injected, sclera clear OROPHARYNX:no exudate, no erythema and lips, buccal mucosa, and tongue normal  NECK: supple, thyroid normal size, non-tender, without nodularity LYMPH:  no palpable cervical, supraclavicular, axillary, or inguinal lymphadenopathy  LUNGS: clear to auscultation bilaterally with normal breathing effort HEART: regular rate & rhythm and no murmurs and no lower extremity edema ABDOMEN:abdomen soft, non-tender and normal bowel sounds. No palpable hepatosplenomegaly Musculoskeletal:no cyanosis of digits and no clubbing  PSYCH: alert & oriented x 3 with fluent speech NEURO: no focal motor/sensory deficits RECTAL patient refused digital rectal exam  LABORATORY DATA:  I have reviewed the data as listed CBC Latest Ref Rng & Units 11/21/2017 11/12/2014  WBC 4.0 - 10.3 10e3/uL 9.8 10.9(H)  Hemoglobin 13.0 - 17.1 g/dL 16.2 16.1  Hematocrit 38.4 - 49.9 % 48.5 48.2  Platelets 140 - 400 10e3/uL 262 358   CMP Latest Ref Rng & Units 11/21/2017 12/12/2014 11/12/2014  Glucose 70 - 140 mg/dl 120 - 93  BUN 7.0 - 26.0 mg/dL 9.2 - 11  Creatinine 0.7 - 1.3 mg/dL 1.0 - 0.88  Sodium 136 - 145 mEq/L 138 - 140  Potassium 3.5 - 5.1 mEq/L 4.6 - 4.2  Chloride 98 - 107  mmol/L - - 107  CO2 22 - 29 mEq/L 25 - 28  Calcium 8.4 - 10.4 mg/dL 9.4 - 8.2(L)  Total Protein 6.4 - 8.3 g/dL 7.2 6.9 7.1  Total Bilirubin 0.20 - 1.20 mg/dL 0.74 0.4 0.3  Alkaline Phos 40 - 150 U/L 89 65 70  AST 5 - 34 U/L '19 21 25  '$ ALT 0 - 55 U/L '17 18 20   '$ CEA:  10/20/17: 396  RADIOGRAPHIC STUDIES: I have personally reviewed the radiological images as listed and agreed with the findings in the report. Ct Chest Wo Contrast  Result Date: 11/21/2017 CLINICAL DATA:  Rectal cancer diagnosed 1 month ago.  Staging. EXAM: CT CHEST WITHOUT CONTRAST TECHNIQUE: Multidetector CT imaging of the chest was performed following the standard protocol without IV contrast. COMPARISON:  Abdominopelvic CT 10/21/2017. FINDINGS: Cardiovascular: Mild atherosclerosis of the aorta, great vessels and coronary arteries. No acute vascular findings on noncontrast imaging. The heart size is normal. There is no pericardial effusion. Mediastinum/Nodes: There are no enlarged mediastinal, hilar or axillary lymph nodes.Hilar assessment is limited by the lack of intravenous contrast. Probable secretions in the right mainstem bronchus. The trachea, esophagus and thyroid gland demonstrate no significant findings. Lungs/Pleura: There is no pleural effusion. Moderate centrilobular emphysema. Mild central airway thickening and dependent atelectasis at both lung bases. No suspicious pulmonary nodules. Upper abdomen: The visualized upper abdomen appears stable without suspicious findings. Musculoskeletal/Chest wall: There is no chest wall mass or suspicious osseous finding. IMPRESSION: 1. No evidence of thoracic metastatic disease. 2. Moderate centrilobular emphysema. 3. Mild coronary artery and Aortic Atherosclerosis (ICD10-I70.0). Electronically Signed   By: Richardean Sale M.D.   On: 11/21/2017 16:51    ASSESSMENT & PLAN:  Mukesh Kornegay is a 71 y.o. caucasian male with history of anxiety, depression, tobacco use presenting with  painless rectal bleeding, abdominal pain, nausea, diarrhea, fatigue, and 25 lbs weight loss over 1 year.   1. Rectal adenocarcinoma, uT3uN0M0, stag IIA -We reviewed imaging and pathology results in detail. He has what appears to be early stage rectal cancer, uT3N0,  CT chest, abdomen and pelvis was negative for distant metastasis, this was reviewed with patient. -We discussed standard treatment regimen which includes neoadjuvant chemoradiation with  xeloda followed by definitive surgery and possible adjuvant systemic chemo, we discussed potential side effects in detail. The patient is initially not very interested in any treatment, after repeated discussion, he is open to radiation now, I will refer him to radiation oncology Dr. Lisbeth Renshaw.  He still refuses definitive surgery. -We discussed the benefit of concurrent chemotherapy with 5-FU or Xeloda.  Potential benefits and side effects discussed with him in detail, he is willing to try Xeloda. --Chemotherapy consent: Side effects including but does not not limited to, fatigue, nausea, vomiting, diarrhea, hair loss, neuropathy, fluid retention, renal and kidney dysfunction, neutropenic fever, needed for blood transfusion, bleeding, coronary artery spasm and heart failure, were discussed with patient in great detail. He agrees to proceed. -Without surgery, the treatment is likely palliative.  We discussed the small possibility of cure with chemo and radiation alone.  -I provided him with reading material on the medication.  -While on Xeloda he will need to follow up with me every 2 weeks  -He has declined the option of surgery at this time.  -He is willing to consider the financial toxicity clinical trial. He will speak with research nurse today  -F/u open    2. Nausea, diarrhea, weight loss -He has intermittent nausea, diarrhea, and 25 pounds weight loss over 1 year.  -He has tried imodium without much relief. I prescribed lomotil to his pharmacy on  11/07/17. For nausea without vomiting, I prescribed Zofran PRN.  -His appetite is low at baseline; eats 1 meal per day. I recommend increasing po intake and placed a referral to dietician.  -Conitnues to have low appetite, stable weight   PLAN: -Prescribe Xeloda today  -Send rad onc referral to Dr. Lisbeth Renshaw  -F/u the first day of radiation, open for now   All questions were answered. The patient knows to call the clinic with any problems, questions or concerns.   I spent 25 minutes counseling the patient face to face. The total time spent in the appointment was 30 minutes and more than 50% was on counseling, review of test results, and coordination of care.    Truitt Merle  11/28/2017    This document serves as a record of services personally performed by Truitt Merle, MD. It was created on her behalf by Joslyn Devon, a trained medical scribe. The creation of this record is based on the scribe's personal observations and the provider's statements to them.    I have reviewed the above documentation for accuracy and completeness, and I agree with the above.

## 2017-11-29 ENCOUNTER — Encounter: Payer: Self-pay | Admitting: Hematology

## 2017-12-01 ENCOUNTER — Encounter: Payer: Self-pay | Admitting: Radiation Oncology

## 2017-12-01 NOTE — Progress Notes (Signed)
GI Location of Tumor / Histology: Rectal cancer (Stage IIA, cT3,cNO,cMO)  Arnoldo Morale presented months ago with symptoms of: intermittent painless rectal bleeding with associated abdominal pain,diarrhea  Biopsies of  (if applicable) revealed: 78/67/67: Rectum ,mass biopsy=Invasive adenocarcinoma,moderately differentiated,colon ascending,transverse=no high grade dysplasia or malignancy  Sigmoid polyp= Tubular adenoma with high grade dysplasia and mucosal plrolapse  Past/Anticipated interventions by surgeon, if any:  Dr. Saralyn Pilar hung, 10/21/17 colonoscopy with biopsies  Past/Anticipated interventions by medical oncology, if any: Dr. Feng,11/28/17 , follow up 1st day of radiation to start xeloda  Weight changes, if any: 25 lb loss  Over 1 year  Bowel/Bladder complaints, if any: diarrhea, has lomotill rx  Nausea / Vomiting, if any: nausea,eats only 1 meal a day,saw Pamala Hurry Neff,dietician12/21/18, takes zofran   Pain issues, if any: at times lower abdomen   Any blood per rectum:    At times in stool, takes lomotil prn SAFETY ISSUES: No  Prior radiation? No  Pacemaker/ICD? NO  Is the patient on methotrexate?NO  Current Complaints/Details:smokes 1 ppd x 61 years,Widowed, lives alone, ,3 children,anxiety/depression, Mother kidney cancer, DM,Paternal uncle Colon cancer, Paternal aunt breast cancer,  Allergies:NKA BP (!) 145/91   Pulse 76   Temp 97.7 F (36.5 C) (Oral)   Resp 20   Ht 5\' 6"  (1.676 m)   Wt 138 lb 12.8 oz (63 kg)   SpO2 99%   BMI 22.40 kg/m   Wt Readings from Last 3 Encounters:  12/04/17 138 lb 12.8 oz (63 kg)  11/28/17 138 lb (62.6 kg)  11/07/17 135 lb 14.4 oz (61.6 kg)   Allergies:NKA

## 2017-12-04 ENCOUNTER — Ambulatory Visit
Admission: RE | Admit: 2017-12-04 | Discharge: 2017-12-04 | Disposition: A | Discharge status: Home / Self Care | Payer: Medicare HMO | Source: Ambulatory Visit | Attending: Radiation Oncology | Admitting: Radiation Oncology

## 2017-12-04 ENCOUNTER — Encounter: Payer: Self-pay | Admitting: General Practice

## 2017-12-04 ENCOUNTER — Encounter: Payer: Self-pay | Admitting: Radiation Oncology

## 2017-12-04 ENCOUNTER — Ambulatory Visit: Payer: Medicare HMO | Admitting: Hematology

## 2017-12-04 VITALS — BP 145/91 | HR 76 | Temp 97.7°F | Resp 20 | Ht 66.0 in | Wt 138.8 lb

## 2017-12-04 DIAGNOSIS — Z803 Family history of malignant neoplasm of breast: Secondary | ICD-10-CM | POA: Diagnosis not present

## 2017-12-04 DIAGNOSIS — Z8051 Family history of malignant neoplasm of kidney: Secondary | ICD-10-CM | POA: Insufficient documentation

## 2017-12-04 DIAGNOSIS — Z9049 Acquired absence of other specified parts of digestive tract: Secondary | ICD-10-CM | POA: Diagnosis not present

## 2017-12-04 DIAGNOSIS — F17210 Nicotine dependence, cigarettes, uncomplicated: Secondary | ICD-10-CM | POA: Insufficient documentation

## 2017-12-04 DIAGNOSIS — Z800 Family history of malignant neoplasm of digestive organs: Secondary | ICD-10-CM | POA: Insufficient documentation

## 2017-12-04 DIAGNOSIS — C20 Malignant neoplasm of rectum: Secondary | ICD-10-CM

## 2017-12-04 DIAGNOSIS — J432 Centrilobular emphysema: Secondary | ICD-10-CM | POA: Diagnosis not present

## 2017-12-04 DIAGNOSIS — R69 Illness, unspecified: Secondary | ICD-10-CM | POA: Diagnosis not present

## 2017-12-04 DIAGNOSIS — I700 Atherosclerosis of aorta: Secondary | ICD-10-CM | POA: Diagnosis not present

## 2017-12-04 DIAGNOSIS — Z510 Encounter for antineoplastic radiation therapy: Secondary | ICD-10-CM | POA: Insufficient documentation

## 2017-12-04 DIAGNOSIS — R634 Abnormal weight loss: Secondary | ICD-10-CM | POA: Insufficient documentation

## 2017-12-04 DIAGNOSIS — I2510 Atherosclerotic heart disease of native coronary artery without angina pectoris: Secondary | ICD-10-CM | POA: Diagnosis not present

## 2017-12-04 DIAGNOSIS — Z79899 Other long term (current) drug therapy: Secondary | ICD-10-CM | POA: Diagnosis not present

## 2017-12-04 HISTORY — DX: Malignant (primary) neoplasm, unspecified: C80.1

## 2017-12-04 NOTE — Progress Notes (Signed)
[facility] Psychosocial Distress Screening Clinical Social Work  Clinical Social Work was referred by distress screening protocol.  The patient scored a 10 on the Psychosocial Distress Thermometer which indicates severe distress. Clinical Social Worker Edwyna Shell to assess for distress and other psychosocial needs. CSW met w patients daughter Lattie Haw) and sister while patient was in Radiation simulation procedure.  Then CSW was able to meet briefly w patient he was waiting to see oncologist.  Per family, patient has difficulty w reading/writing and can have difficulty understanding complex issues.  At the same time, patient is very independent, used to solving his own problems, and will state that he understands issues when he may not have a full comprehension.  Patient is frustrated w cancer diagnosis, treatment, possible side effects and perceived stigma of cancer diagnosis.  Can display frustration by negative verbalizations which can make caregiving difficult.  Per daughter and sister, family is prepared to help patient get to all treatment appointments.  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  Patient has difficulty taking suggestions from family and is generally distrustful of medical providers/care. At baseline, patient can be irritable and independent minded.  Family is working on acceptance of patient's negative verbalizations, realizing that patient needs their help but needing to find ways to practice effective self care in order to continue to assist.  Daughter Lattie Haw has been primary family support; however, patient has two other children who live locally and can help out.  Per family, patient continues to struggle w distress over death of father when he was a child and death of wife in a MVA in 2007/02/13.  CSW provided normalization of feelings of frustration re caregiving,  encouraged self care and limit setting/boundaries w family.  Problem solved ways to help family help patient problem solve symptom management by taking an indirect approach and/or involving medical care team in making suggestions for symptom management.  Per family, patient did better when prescribed anxiolytics in past; however, patient is opposed to most medications and prefers to manage his own symptoms without meds.  Per family, patient has difficulty sleeping and can go 2 nights without sleep on a regular basis - encouraged family to encourage patient to discuss this w providers.  CSW spoke w patient, introduced self, acknowledged difficulty/distress involved in cancer diagnosis/treatment, offered support services as needed.  Family provided w [facility] calendar and CSW contact information - encouraged family to reach out as needed.    ONCBCN DISTRESS SCREENING 12/04/2017  Screening Type Initial Screening  Distress experienced in past week (1-10) 10  Emotional problem type Nervousness/Anxiety;Adjusting to illness  Physical Problem type Nausea/vomiting;Sleep/insomnia;Constipation/diarrhea  Physician notified of physical symptoms Yes  Referral to clinical social work Yes  Referral to dietition Yes    Clinical Social Worker follow up needed: No.  Patient and family encouraged to contact CSW as needed; CSW will touch base w patient during scheduled appointments in future.  Pls recontact as needs arise.    If yes, follow up plan:

## 2017-12-04 NOTE — Progress Notes (Signed)
  Radiation Oncology         [phone]) [phone] ________________________________  Name: Wesley [name] MRN: [id number]  Date: 12/04/2017  DOB: 04-29-1946    SIMULATION AND TREATMENT PLANNING NOTE  DIAGNOSIS:     ICD-10-CM   1. Rectal adenocarcinoma ([place] [facility]) C20      The patient presented for simulation for the patient's upcoming course of radiation for the diagnosis of rectal cancer. The patient was placed in a supine position. A customized vac-lock bag was constructed to aid in patient immobilization on. This complex treatment device will be used on a daily basis during the treatment. In this fashion a CT scan was obtained through the pelvic region and the isocenter was placed near midline within the pelvis. Surface markings were placed.  The patient's imaging was loaded into the radiation treatment planning system. The patient will initially be planned to receive a course of radiation to a dose of 45 Gy. This will be accomplished in 25 fractions at 1.8 gray per fraction. This initial treatment will correspond to a 3-D conformal technique. The target volume has been contoured in addition to the rectum, bladder and femoral heads. Dose volume histograms of each of these structures have been requested and these will be carefully reviewed as part of the 3-D conformal treatment planning process. To accomplish this initial treatment, 4 customized blocks have been designed for this purpose. Each of these 4 complex treatment devices will be used on a daily basis during the initial course of the treatment. It is anticipated that the patient will then receive a boost for an additional 9 Gy. The anticipated total dose therefore will be 54 Gy.    Special treatment procedure The patient will receive chemotherapy during the course of radiation treatment. The patient may experience increased or overlapping toxicity due to this combined-modality approach and the patient will be monitored for such problems. This may  include extra lab work as necessary. This therefore constitutes a special treatment procedure.    ________________________________  Jodelle Gross, MD, PhD

## 2017-12-04 NOTE — Progress Notes (Signed)
  Radiation Oncology         [phone]) [phone] ________________________________  Name: Wesley [name] MRN: [id number]  Date: 12/04/2017  DOB: [birth year]  Optical Surface Tracking Plan:  Since intensity modulated radiotherapy (IMRT) and 3D conformal radiation treatment methods are predicated on accurate and precise positioning for treatment, intrafraction motion monitoring is medically necessary to ensure accurate and safe treatment delivery.  The ability to quantify intrafraction motion without excessive ionizing radiation dose can only be performed with optical surface tracking. Accordingly, surface imaging offers the opportunity to obtain 3D measurements of patient position throughout IMRT and 3D treatments without excessive radiation exposure.  I am ordering optical surface tracking for this patient's upcoming course of radiotherapy. ________________________________  Kyung Rudd, MD 12/04/2017 7:44 PM    Reference:   Ursula Alert, J, et al. Surface imaging-based analysis of intrafraction motion for breast radiotherapy patients.Journal of [place], n. 6, nov. 2014. ISSN [id number].   Available at: <http://www.jacmp.org/index.php/jacmp/article/view/4957>.

## 2017-12-04 NOTE — Progress Notes (Signed)
Radiation Oncology         [phone]) [phone] ________________________________  Name: Wesley [name]        MRN: [id number]  Date of Service: 12/04/2017 DOB: Aug 19, 1946  QB:HALPF, Coralie Keens, NP  Truitt Merle, MD     REFERRING PHYSICIAN: Truitt Merle, MD   DIAGNOSIS: The encounter diagnosis was Rectal adenocarcinoma [facility]).   HISTORY OF PRESENT ILLNESS: Wesley [name] is a 72 y.o. male seen at the request of Dr. Burr Medico.  He initially presented to Dr. Carol Ada, gastroenterologist, on 10/17/17 with complaints of a 1 year history of painless, intermittent hematochezia and an approximate weight loss of 25 pounds over the past year. There was a palpable mass in the rectal vault approximately 5 cm from the anal verge and fresh blood on the examination glove. He underwent a colonoscopy with biopsy of a 3 cm rectal mass on 10/21/2017 which revealed a large rectal adenocarcinoma.  Findings on colonoscopy noted a fungating, infiltrative an ulcerated nonobstructing mass in the rectum which was circumferential and measured approximately 3 cm in length. Additionally, there were 2 sessile polyps in the transverse colon and descending colon as well as a 15 mm polyp in the sigmoid colon all of which were removed for biopsy. Final pathology revealed invasive adenocarcinoma in the rectal mass, tubular adenoma in the polyps.  CEA: 396 on 10/20/17  CT A/P 10/21/17 demonstrated irregular mural and mucosal thickening of the rectum with a small right 9 mm index lymph node adjacent to the rectum. There was no mechanical source of obstruction noted.  He underwent endorectal ultrasound on 10/31/2017 which showed a 3 cm hypoechoic mass in the rectum, approximately 5 cm from the anal verge and partially circumferential. The Endo sonographic borders were poorly defined and irregular and there was evidence suggesting breakthrough of the muscularis propria with invasion into the perirectal fat but no significant nodes. There was no  evidence of invasion into adjacent structures.  He had a CT chest on 11/21/2017 which did not show any evidence of thoracic metastatic disease.  He presents today, accompanied by his sister and 1 daughter, to discuss the potential role of neoadjuvant chemoradiation in the management of his disease. He remains strongly opposed to the idea of any surgical intervention.  PREVIOUS RADIATION THERAPY: No   PAST MEDICAL HISTORY:  He has a remote history of having a skin cancer removed from his left lower lip.  Past Medical History:  Diagnosis Date  . Cancer ([place]) 10/21/2017   rectal cancer  . Rectal adenocarcinoma ([place]) 11/07/2017       PAST SURGICAL HISTORY: Past Surgical History:  Procedure Laterality Date  . CHOLECYSTECTOMY    . EUS N/A 10/31/2017   Procedure: LOWER ENDOSCOPIC ULTRASOUND (EUS);  Surgeon: Carol Ada, MD;  Location: [facility];  Service: Endoscopy;  Laterality: N/A;     FAMILY HISTORY:  Family History  Problem Relation Age of Onset  . Cancer Mother        Kidney  . Diabetes Mother   . Cancer Paternal Uncle        colon  . Cancer Paternal Aunt 46       breast cancer  . Hyperlipidemia Neg Hx   . Hypertension Neg Hx   . Stroke Neg Hx      SOCIAL HISTORY:  reports that he has been smoking cigarettes.  He has a 56.00 pack-year smoking history. he has never used smokeless tobacco. He reports that he does not drink alcohol or  use drugs.   ALLERGIES: Patient has no known allergies.   MEDICATIONS:  Current Outpatient Medications  Medication Sig Dispense Refill  . diphenoxylate-atropine (LOMOTIL) 2.5-0.025 MG tablet Take 1 tablet by mouth 4 (four) times daily as needed for diarrhea or loose stools. 30 tablet 0  . ondansetron (ZOFRAN) 8 MG tablet Take 1 tablet (8 mg total) by mouth every 8 (eight) hours as needed for nausea or vomiting. 20 tablet 0  . capecitabine (XELODA) 500 MG tablet Take 3 tablets (1,500 mg total) by mouth 2 (two) times daily after a  meal. Take Monday through Friday when you on radiation treatment (Patient not taking: Reported on 12/04/2017) 90 tablet 1   No current facility-administered medications for this encounter.      REVIEW OF SYSTEMS: On review of systems, the patient reports that he is doing well overall. He denies any chest pain, increased shortness of breath, cough, fevers, chills, night sweats.  He has noted approximate 25lb unintended weight loss over the past year but reports that his weight has remained stable recently. He reports a poor appetite at baseline. He denies any bowel or bladder disturbances, and denies abdominal pain, nausea or vomiting. He has had intermittent, painless hematochezia over the past year. He denies any new musculoskeletal or joint aches or pains. A complete review of systems is obtained and is otherwise negative.  PHYSICAL EXAM:  Wt Readings from Last 3 Encounters:  12/04/17 138 lb 12.8 oz (63 kg)  11/28/17 138 lb (62.6 kg)  11/07/17 135 lb 14.4 oz (61.6 kg)   Temp Readings from Last 3 Encounters:  12/04/17 97.7 F (36.5 C) (Oral)  11/28/17 98.3 F (36.8 C) (Oral)  11/07/17 98.5 F (36.9 C) (Oral)   BP Readings from Last 3 Encounters:  12/04/17 (!) 145/91  11/28/17 125/85  11/07/17 132/73   Pulse Readings from Last 3 Encounters:  12/04/17 76  11/28/17 70  11/07/17 74   Pain Assessment Pain Score: 0-No pain/10  In general this is a well appearing Caucasian male in no acute distress. He is alert and oriented x4 and appropriate throughout the examination. HEENT reveals that the patient is normocephalic, atraumatic. EOMs are intact. PERRLA. Skin is intact without any evidence of gross lesions. Cardiovascular exam reveals a regular rate and rhythm, no clicks rubs or murmurs are auscultated. Chest is clear to auscultation bilaterally. Lymphatic assessment is performed and does not reveal any adenopathy in the cervical, supraclavicular, axillary, or inguinal chains. Abdomen has  active bowel sounds in all quadrants and is intact. The abdomen is soft, non tender, non distended. Lower extremities are negative for pretibial pitting edema, deep calf tenderness, cyanosis or clubbing.  ECOG = 1  0 - Asymptomatic (Fully active, able to carry on all predisease activities without restriction)  1 - Symptomatic but completely ambulatory (Restricted in physically strenuous activity but ambulatory and able to carry out work of a light or sedentary nature. For example, light housework, office work)  2 - Symptomatic, <50% in bed during the day (Ambulatory and capable of all self care but unable to carry out any work activities. Up and about more than 50% of waking hours)  3 - Symptomatic, >50% in bed, but not bedbound (Capable of only limited self-care, confined to bed or chair 50% or more of waking hours)  4 - Bedbound (Completely disabled. Cannot carry on any self-care. Totally confined to bed or chair)  5 - Death   Eustace Pen MM, Creech RH, Tormey DC, et al. (  1982). "Toxicity and response criteria of the [facility]". [place] Oncol. 5 (6): 649-55    LABORATORY DATA:  Lab Results  Component Value Date   WBC 9.8 11/21/2017   HGB 16.2 11/21/2017   HCT 48.5 11/21/2017   MCV 89.8 11/21/2017   PLT 262 11/21/2017   Lab Results  Component Value Date   NA 138 11/21/2017   K 4.6 11/21/2017   CL 107 11/12/2014   CO2 25 11/21/2017   Lab Results  Component Value Date   ALT 17 11/21/2017   AST 19 11/21/2017   ALKPHOS 89 11/21/2017   BILITOT 0.74 11/21/2017      RADIOGRAPHY: Ct Chest Wo Contrast  Result Date: 11/21/2017 CLINICAL DATA:  Rectal cancer diagnosed 1 month ago.  Staging. EXAM: CT CHEST WITHOUT CONTRAST TECHNIQUE: Multidetector CT imaging of the chest was performed following the standard protocol without IV contrast. COMPARISON:  Abdominopelvic CT 10/21/2017. FINDINGS: Cardiovascular: Mild atherosclerosis of the aorta, great vessels and  coronary arteries. No acute vascular findings on noncontrast imaging. The heart size is normal. There is no pericardial effusion. Mediastinum/Nodes: There are no enlarged mediastinal, hilar or axillary lymph nodes.Hilar assessment is limited by the lack of intravenous contrast. Probable secretions in the right mainstem bronchus. The trachea, esophagus and thyroid gland demonstrate no significant findings. Lungs/Pleura: There is no pleural effusion. Moderate centrilobular emphysema. Mild central airway thickening and dependent atelectasis at both lung bases. No suspicious pulmonary nodules. Upper abdomen: The visualized upper abdomen appears stable without suspicious findings. Musculoskeletal/Chest wall: There is no chest wall mass or suspicious osseous finding. IMPRESSION: 1. No evidence of thoracic metastatic disease. 2. Moderate centrilobular emphysema. 3. Mild coronary artery and Aortic Atherosclerosis (ICD10-I70.0). Electronically Signed   By: Richardean Sale M.D.   On: 11/21/2017 16:51       IMPRESSION/PLAN: 80. 72 year old male with newly diagnosed rectal adenocarcinoma, [id number], stag IIA.   Today, Dr. Lisbeth Renshaw talked to the patient and family about the findings and workup thus far. We discussed the natural history of rectal adenocarcinoma and general treatment, highlighting the role of radiotherapy in the management. The standard treatment recommendation would include neoadjuvant chemoradiation with Xeloda followed by definitive surgery and possible adjuvant systemic therapy but the patient is adamantly opposed to definitive surgery at this time. He is agreeable to proceeding with concurrent chemoradiation.  We discussed the available radiation techniques, and focused on the details of logistics and delivery. The recommendation is for 6 weeks of daily radiotherapy to the rectal mass. We reviewed the anticipated acute and late sequelae associated with radiation in this setting. The patient was encouraged to  ask questions that were answered to his satisfaction.  At the conclusion of our visit, the patient elects to proceed with concurrent chemoradiation.  He freely signed written consent today in the office and is scheduled for CT simulation/treatment planning following his visit today. We anticipate beginning treatment the week of January 14th and will coordinate this with his chemotherapy start date.   The above documentation reflects my direct findings during this shared patient visit. Please see the separate note by Dr. Lisbeth Renshaw on this date for the remainder of the patient's plan of care.    Nicholos Johns, PA-C

## 2017-12-04 NOTE — Progress Notes (Signed)
Please see the Nurse Progress Note in the MD Initial Consult Encounter for this patient. 

## 2017-12-05 ENCOUNTER — Telehealth: Payer: Self-pay | Admitting: Pharmacy Technician

## 2017-12-05 ENCOUNTER — Telehealth: Payer: Self-pay | Admitting: Pharmacist

## 2017-12-05 DIAGNOSIS — I2510 Atherosclerotic heart disease of native coronary artery without angina pectoris: Secondary | ICD-10-CM | POA: Diagnosis not present

## 2017-12-05 DIAGNOSIS — Z8051 Family history of malignant neoplasm of kidney: Secondary | ICD-10-CM | POA: Diagnosis not present

## 2017-12-05 DIAGNOSIS — C20 Malignant neoplasm of rectum: Secondary | ICD-10-CM

## 2017-12-05 DIAGNOSIS — Z510 Encounter for antineoplastic radiation therapy: Secondary | ICD-10-CM | POA: Diagnosis not present

## 2017-12-05 DIAGNOSIS — R634 Abnormal weight loss: Secondary | ICD-10-CM | POA: Diagnosis not present

## 2017-12-05 DIAGNOSIS — J432 Centrilobular emphysema: Secondary | ICD-10-CM | POA: Diagnosis not present

## 2017-12-05 DIAGNOSIS — R69 Illness, unspecified: Secondary | ICD-10-CM | POA: Diagnosis not present

## 2017-12-05 DIAGNOSIS — Z79899 Other long term (current) drug therapy: Secondary | ICD-10-CM | POA: Diagnosis not present

## 2017-12-05 DIAGNOSIS — Z9049 Acquired absence of other specified parts of digestive tract: Secondary | ICD-10-CM | POA: Diagnosis not present

## 2017-12-05 DIAGNOSIS — I700 Atherosclerosis of aorta: Secondary | ICD-10-CM | POA: Diagnosis not present

## 2017-12-05 MED ORDER — CAPECITABINE 500 MG PO TABS: 1500.0000 mg | ORAL_TABLET | Freq: Two times a day (BID) | ORAL | 0 refills | Status: DC

## 2017-12-05 NOTE — Telephone Encounter (Signed)
Oral Oncology Patient Advocate Encounter  Received notification from [employer] that prior authorization for Xeloda is required.  PA submitted on CoverMyMeds Key [id number] Status is pending  Oral Oncology Clinic will continue to follow.  Wesley [name]. Wesley [name], [name] [name] [phone] 12/05/2017 2:58 PM

## 2017-12-05 NOTE — Telephone Encounter (Signed)
Oral Oncology Pharmacist Encounter  Received new prescription for capecitabine for the treatment of newly diagnosed rectal cancer stage IIA. It will be used in conjunction with radiation, planned duration 6 weeks. Patient is declining surgery at this time per notes.  Spoke with patient's daughter and counseled on administration, dosing, side effects, monitoring, drug-food interactions, safe handling, storage, and disposal.  Labs from 12/21 assessed, okay for treatment. CrCL ~60 mL/min.  Current medication list in Epic reviewed, DDIs with capecitabine identified: Some risk for QTc prolongation with ondansetron use. Monitor electrolytes and correct as indicated.  Patient will take Capecitabine 1500mg  PO BID on days of radiation only (M-F) within 30 minutes of a meal. Noted that patient only usually eats one meal per day. Daughter said that she has been trying to get him to eat more, and she will reiterate to him that taking the mediation with food could help decrease side effects.  Start date: likely 1/14 with first radiation treatment   Side effects include but not limited to: diarrhea, nausea, hand-and-foot syndrome, decreased blood counts. Has ondansetron and lomotil to use when needed.   Reviewed with patient importance of keeping a medication schedule and plan for any missed doses.  Wesley [name] voiced understanding and appreciation. She understands to call oral oncology pharmacist with any medication questions that come up.  PA is in process and should be done beginning of next week. The daughter knows to expect a call then for copay information.   Demetrius Charity, PharmD PGY2 Oncology Pharmacy Resident  Pharmacy Phone: [phone] 12/05/2017

## 2017-12-08 NOTE — Telephone Encounter (Signed)
Oral Oncology Patient Advocate Encounter  Prior Authorization for Xeloda has been approved.    PA# [id number] Effective dates: 12/08/2017 through 06/04/2018  Oral Oncology Clinic will continue to follow.   Wesley [name]. [name], [place] Patient [place] [phone] 12/08/2017 2:09 PM

## 2017-12-10 ENCOUNTER — Other Ambulatory Visit: Payer: Self-pay | Admitting: Pharmacist

## 2017-12-11 ENCOUNTER — Encounter: Payer: Self-pay | Admitting: Hematology

## 2017-12-11 NOTE — Progress Notes (Signed)
Patient's daughter Lattie Haw called and states patient has new insurance and she wasn't aware until the new card came in the mail. Asked her if she could email a copy and I will scan in [place]. Also advised her to contact emily w/ oral chemo to be sure she has the new infromation as well as Cindy in Radiation. Provided her with the numbers and my email address.

## 2017-12-12 ENCOUNTER — Telehealth: Payer: Self-pay

## 2017-12-12 DIAGNOSIS — R69 Illness, unspecified: Secondary | ICD-10-CM | POA: Diagnosis not present

## 2017-12-12 MED FILL — CAPECITABINE 500 MG TABLET: 500 | 26 days supply | Qty: 115 | Fill #0

## 2017-12-12 NOTE — Telephone Encounter (Signed)
Daughter called to say that if the weather is bad her father may be coming to [facility] by himself for appointment with Dr. Burr Medico and for his first radiation tx. Daughter is concerned because her father is hard of hearing and may not know how to get from one appointment to another. I let daughter know that I would be available to assist patient.

## 2017-12-15 ENCOUNTER — Telehealth: Payer: Self-pay | Admitting: Hematology

## 2017-12-15 ENCOUNTER — Inpatient Hospital Stay: Payer: Medicare HMO | Attending: Hematology | Admitting: Hematology

## 2017-12-15 ENCOUNTER — Inpatient Hospital Stay: Payer: Medicare HMO

## 2017-12-15 ENCOUNTER — Encounter: Payer: Self-pay | Admitting: Hematology

## 2017-12-15 ENCOUNTER — Ambulatory Visit
Admission: RE | Admit: 2017-12-15 | Discharge: 2017-12-15 | Disposition: A | Discharge status: Home / Self Care | Payer: Medicare HMO | Source: Ambulatory Visit | Attending: Radiation Oncology | Admitting: Radiation Oncology

## 2017-12-15 VITALS — BP 137/69 | HR 76 | Temp 97.6°F | Resp 18 | Ht 66.0 in | Wt 140.8 lb

## 2017-12-15 DIAGNOSIS — G4700 Insomnia, unspecified: Secondary | ICD-10-CM | POA: Diagnosis not present

## 2017-12-15 DIAGNOSIS — Z510 Encounter for antineoplastic radiation therapy: Secondary | ICD-10-CM | POA: Diagnosis not present

## 2017-12-15 DIAGNOSIS — Z79899 Other long term (current) drug therapy: Secondary | ICD-10-CM | POA: Diagnosis not present

## 2017-12-15 DIAGNOSIS — I700 Atherosclerosis of aorta: Secondary | ICD-10-CM | POA: Insufficient documentation

## 2017-12-15 DIAGNOSIS — J432 Centrilobular emphysema: Secondary | ICD-10-CM | POA: Diagnosis not present

## 2017-12-15 DIAGNOSIS — F419 Anxiety disorder, unspecified: Secondary | ICD-10-CM | POA: Insufficient documentation

## 2017-12-15 DIAGNOSIS — R110 Nausea: Secondary | ICD-10-CM

## 2017-12-15 DIAGNOSIS — R634 Abnormal weight loss: Secondary | ICD-10-CM | POA: Diagnosis not present

## 2017-12-15 DIAGNOSIS — I2510 Atherosclerotic heart disease of native coronary artery without angina pectoris: Secondary | ICD-10-CM | POA: Diagnosis not present

## 2017-12-15 DIAGNOSIS — F329 Major depressive disorder, single episode, unspecified: Secondary | ICD-10-CM | POA: Diagnosis not present

## 2017-12-15 DIAGNOSIS — R197 Diarrhea, unspecified: Secondary | ICD-10-CM | POA: Insufficient documentation

## 2017-12-15 DIAGNOSIS — F17210 Nicotine dependence, cigarettes, uncomplicated: Secondary | ICD-10-CM | POA: Diagnosis not present

## 2017-12-15 DIAGNOSIS — C20 Malignant neoplasm of rectum: Secondary | ICD-10-CM | POA: Insufficient documentation

## 2017-12-15 DIAGNOSIS — Z8051 Family history of malignant neoplasm of kidney: Secondary | ICD-10-CM | POA: Diagnosis not present

## 2017-12-15 DIAGNOSIS — E785 Hyperlipidemia, unspecified: Secondary | ICD-10-CM | POA: Diagnosis not present

## 2017-12-15 DIAGNOSIS — Z9049 Acquired absence of other specified parts of digestive tract: Secondary | ICD-10-CM | POA: Diagnosis not present

## 2017-12-15 DIAGNOSIS — R69 Illness, unspecified: Secondary | ICD-10-CM | POA: Diagnosis not present

## 2017-12-15 LAB — COMPREHENSIVE METABOLIC PANEL
ALK PHOS: 73 U/L (ref 40–150)
ALT: 14 U/L (ref 0–55)
ANION GAP: 8 (ref 3–11)
AST: 17 U/L (ref 5–34)
Albumin: 3.6 g/dL (ref 3.5–5.0)
BILIRUBIN TOTAL: 0.4 mg/dL (ref 0.2–1.2)
BUN: 10 mg/dL (ref 7–26)
CALCIUM: 9.4 mg/dL (ref 8.4–10.4)
CO2: 26 mmol/L (ref 22–29)
Chloride: 106 mmol/L (ref 98–109)
Creatinine, Ser: 0.98 mg/dL (ref 0.70–1.30)
GFR calc Af Amer: >60 mL/min (ref >60)
Glucose, Bld: 68 mg/dL — ABNORMAL LOW (ref 70–140)
Potassium: 4.2 mmol/L (ref 3.5–5.1)
Sodium: 140 mmol/L (ref 136–145)
TOTAL PROTEIN: 7.1 g/dL (ref 6.4–8.3)

## 2017-12-15 LAB — CBC WITH DIFFERENTIAL/PLATELET
BASOS ABS: 0.1 10*3/uL (ref 0.0–0.1)
BASOS PCT: 1 %
EOS PCT: 2 %
Eosinophils Absolute: 0.2 10*3/uL (ref 0.0–0.5)
HCT: 49.1 % (ref 38.4–49.9)
Hemoglobin: 16.3 g/dL (ref 13.0–17.1)
Lymphocytes Relative: 19 %
Lymphs Abs: 2 10*3/uL (ref 0.9–3.3)
MCH: 30 pg (ref 27.2–33.4)
MCHC: 33.3 g/dL (ref 32.0–36.0)
MCV: 90.3 fL (ref 79.3–98.0)
Monocytes Absolute: 0.8 10*3/uL (ref 0.1–0.9)
Monocytes Relative: 8 %
Neutro Abs: 7.3 10*3/uL — ABNORMAL HIGH (ref 1.5–6.5)
Neutrophils Relative %: 70 %
PLATELETS: 271 10*3/uL (ref 140–400)
RBC: 5.44 MIL/uL (ref 4.20–5.82)
RDW: 13.6 % (ref 11.0–15.6)
WBC: 10.3 10*3/uL (ref 4.0–10.3)

## 2017-12-15 LAB — CEA (IN HOUSE-CHCC): CEA (CHCC-In House): 434.6 ng/mL — ABNORMAL HIGH (ref 0.00–5.00)

## 2017-12-15 NOTE — Progress Notes (Signed)
[place]  [phone] [phone]  Clinic Follow Up Note   Patient Care Team: Jearld Fenton, NP as PCP - General (Internal Medicine) Carol Ada, MD as Consulting Physician (Gastroenterology) Truitt Merle, MD as Consulting Physician (Hematology) Alla Feeling, NP as Nurse Practitioner (Nurse Practitioner)   Date of Service:  12/15/2017   CHIEF COMPLAINTS:  Follow up rectal cancer    Oncology History   Cancer Staging Rectal adenocarcinoma [facility]) Staging form: Colon and Rectum, AJCC 8th Edition - Clinical stage from 10/31/2017: Stage IIA (cT3, cN0, cM0) - Signed by Truitt Merle, MD on 11/28/2017       Rectal adenocarcinoma ([place])   10/21/2017 Procedure    COLONOSCOPY per Dr. Carol Ada Findings: A fungating, infiltrative and ulcerated non--obstructing large mass was found in the rectum.  The mass was circumferential.  The mass measured 3 cm in length.  Oozing was present.  This was biopsied with a cold snare for histology.  2 sessile polyps were found in the transverse colon and ascending colon.  The polyps were 3-4 mm in size.  The polyps were removed with a cold snare.  Section and retrieval were complete.  A 15 mm polyp was found in the sigmoid colon.  The polyp was semi-pedunculated.  The polyp was removed with a hot snare.  Resection and retrieval were complete  The mass was extremely friable and palpable with a rectal examination.  The distal extent was 5 cm from the dentate line and it was 3 cm in length.  Specimens were obtained using a cold snare.  Retroflexion was not possible with the close proximity of the lesion in the rectum.         10/21/2017 Initial Biopsy    Final microscopic diagnosis A.  Colon, ascending, polyp: Tubular adenoma No high-grade dysplasia or malignancy  B.  Colon, transverse, polyp: Tubular adenoma No high-grade dysplasia or malignancy  C.  Colon, sigmoid, polyp: Tubular adenoma with high-grade  dysplasia and mucosal prolapse  D.  Rectum, mass, biopsy: Invasive adenocarcinoma, moderately differentiated Immunohistochemical stains for ML H1, [place] 2, [place] 6 and PMS 2 are intact (normal)  There is no evidence of DNA mismatch repair deficiency in the carcinoma, indicating that the tumor is most likely microsatellite stable, and that Lynch syndrome (a common cause of hereditary non-polyposis colorectal cancer or HNPCC) is very unlikely      10/21/2017 Imaging    CT A/P W CONTRAST IMPRESSION: 1. Irregular mural and mucosal thickening of the rectum with small right 9 mm short axis index lymph node adjacent to the rectum. 2. Diffuse mild to moderate fluid-filled distention of small bowel loops query ileus or dysmotility. No mechanical source obstruction is seen. 3. Submucosal fatty infiltration of the descending and sigmoid colon possibly representing stigmata of chronic inflammatory bowel disease.       10/21/2017 Initial Diagnosis    Rectal adenocarcinoma ([place])      10/31/2017 Procedure    EUS: Findings: A hypoechoic mass was found in the rectum. The mass was encountered at 5 cm (from the anal verge). The mass was partially circumferential (involving 50% of the lumen). The endosonographic borders were poorly-defined and irregular. The mass measured 30 mm (in maximum length) by 13 mm (in maximum thickness). There was sonographic evidence suggesting breakthrough of the muscularis propria with invasion into the perirectal fat (uT3, manifested by prominent pseudopodia). There was no sonographic evidence of invasion into the adjacent structures. Area was tattooed with an  injection of 3 mL of Spot (carbon black) both proximally and distally. I was not able to identify any significant lymph nodes. - Rectal mass was visualized endosonographically. A tissue diagnosis was obtained prior to this exam. This is of adenocarcinoma. This was staged uT3 uN0. Tattooed. - No specimens collected.       11/21/2017 Imaging    CT Chest 11/21/17  IMPRESSION: 1. No evidence of thoracic metastatic disease. 2. Moderate centrilobular emphysema. 3. Mild coronary artery and Aortic Atherosclerosis (ICD10-I70.0).       Radiation Therapy    PENDING concurrent chemoradiation at [facility]    PENDING concurrent chemoradiation with Xeloda 3 tabs BID       HISTORY OF PRESENTING ILLNESS:  Wesley KEEVEN 72 y.o. male is here because of newly diagnosed rectal cancer. He reports intermittent painless rectal bleeding with associated abdominal pain, diarrhea, nausea, fatigue, and 25 pounds weight loss for 1 year. reported usual weight is 160 lbs. He attributed rectal bleeding to hemorrhoids and delayed seeking medical care because he is distrustful of the health care system. He does not have large appetite in general, eats 1 meal per day at baseline. Denies bloating, cramping, dysphagia, emesis, or early satiety. He presented to medical doctor who ordered colonoscopy which found a fungating, infiltrative, and ulcerated non-obstructing rectal mass, 2 sessile polyps in the transverse and ascending colon and 1 semi-pedunculated polyp in the sigmoid colon. The rectal mass was palpable on digital rectal exam. He then had CT abdomen and pelvis which showed irregular mural and mucosal thickening of the rectum with small right 9 mm short axis index lymph node adjacent to the rectum. EUS confirmed sonographic evidence suggesting breakthrough of the muscularis proprioa with invasion into the perirectal fat; there was no evidence of invasion into adjacent structures; there were no identifiable significant lymph nodes.   In the past he has never had a colonoscopy. He has past medical history anxiety, depression, insomnia, hyperlipidemia, and tobacco use. He has smoked 1 PPD for 56 years. Denies alcohol or other drug use. He is widowed, lives alone, has 3 adult children who are healthy. One daughter  and his sister accompany him to consult. This daughter lives approx 20 minutes away, sister lives 1 hour away. He drives dump truck. He is independent with ADL's.   Today he continues to report painless rectal bleeding, intermittent nausea and diarrhea. Imodium has not improved much. He is moderately fatigued but continues to work. He is willing to consider treatment but does not want to be a burden on his family.    CURRENT THERAPY: Started 12/15/17 concurrent radiation and Xeloda '1500mg'$  BID on days of radiation  INTERVAL HISTORY  Wesley [name] is here for a follow up after starting Xeloda this morning. He presents to the clinic today accompanied by his daughter Kern Reap. He has no problems so far. His radiation also starts today. Pt notes that he has not had a bowel movement in 2 days. He had an upset stomach yesterday and took an antidiuretic. He denies any pain. His energy is normal but his appetite is still low, but has stable weight. Pt drinks 2 liters of mountain dew every day and does not drink much water. He is not diabetic.   On review of systems, he reports some mild anal bleeding occasionally and no other complaints at this time.     REVIEW OF SYSTEMS:  Constitutional: Denies fevers, chills or abnormal night  sweats (+) low appetite at baseline (+) stable weight   Eyes: Denies blurriness of vision, double vision or watery eyes Ears, nose, mouth, throat, and face: Denies mucositis or sore throat Respiratory: Denies dyspnea or wheezes (+) "smoker's cough" Cardiovascular: Denies palpitation, chest discomfort or lower extremity swelling Gastrointestinal:  Denies abdominal pain, bloating, cramping, early satiety, constipation, emesis, heartburn  (+) occasional painless rectal bleeding x1 year  Skin: Denies abnormal skin rashes Lymphatics: Denies new lymphadenopathy or easy bruising Neurological:Denies numbness, tingling or new weaknesses  Behavioral/Psych: Mood is stable, no new  changes (+) depression (+) anxiety All other systems were reviewed with the patient and are negative.   MEDICAL HISTORY:  Past Medical History:  Diagnosis Date  . Cancer ([place]) 10/21/2017   rectal cancer  . Rectal adenocarcinoma ([place]) 11/07/2017    SURGICAL HISTORY: Past Surgical History:  Procedure Laterality Date  . CHOLECYSTECTOMY    . EUS N/A 10/31/2017   Procedure: LOWER ENDOSCOPIC ULTRASOUND (EUS);  Surgeon: Carol Ada, MD;  Location: [facility];  Service: Endoscopy;  Laterality: N/A;    SOCIAL HISTORY: Social History   Socioeconomic History  . Marital status: Widowed    Spouse name: Not on file  . Number of children: 3  . Years of education: Not on file  . Highest education level: Not on file  Social Needs  . Financial resource strain: Not on file  . Food insecurity - worry: Not on file  . Food insecurity - inability: Not on file  . Transportation needs - medical: Not on file  . Transportation needs - non-medical: Not on file  Occupational History  . Occupation: truck Geophysicist/field seismologist  Tobacco Use  . Smoking status: Current Every Day Smoker    Packs/day: 1.00    Years: 56.00    Pack years: 56.00    Types: Cigarettes  . Smokeless tobacco: Never Used  Substance and Sexual Activity  . Alcohol use: No    Alcohol/week: 0.0 oz  . Drug use: No  . Sexual activity: Not Currently  Other Topics Concern  . Not on file  Social History Narrative  . Not on file    FAMILY HISTORY: Family History  Problem Relation Age of Onset  . Cancer Mother        Kidney  . Diabetes Mother   . Cancer Paternal Uncle        colon  . Cancer Paternal Aunt 32       breast cancer  . Hyperlipidemia Neg Hx   . Hypertension Neg Hx   . Stroke Neg Hx     ALLERGIES:  has No Known Allergies.  MEDICATIONS:  Current Outpatient Medications  Medication Sig Dispense Refill  . capecitabine (XELODA) 500 MG tablet Take 3 tablets (1,500 mg total) by mouth 2 (two) times daily after a meal. Take  Monday through Friday when on radiation treatment. 180 tablet 0  . ondansetron (ZOFRAN) 8 MG tablet Take 1 tablet (8 mg total) by mouth every 8 (eight) hours as needed for nausea or vomiting. 20 tablet 0  . diphenoxylate-atropine (LOMOTIL) 2.5-0.025 MG tablet Take 1 tablet by mouth 4 (four) times daily as needed for diarrhea or loose stools. (Patient not taking: Reported on 12/15/2017) 30 tablet 0   No current facility-administered medications for this visit.     PHYSICAL EXAMINATION:  ECOG PERFORMANCE STATUS: 1 - Symptomatic but completely ambulatory  Vitals:   12/15/17 1142  BP: 137/69  Pulse: 76  Resp: 18  Temp: 97.6 F (36.4  C)  SpO2: 99%   Filed Weights   12/15/17 1142  Weight: 140 lb 12.8 oz (63.9 kg)    GENERAL:alert, no distress and comfortable SKIN: skin color, texture, turgor are normal, no rashes or significant lesions EYES: normal, conjunctiva are pink and non-injected, sclera clear OROPHARYNX:no exudate, no erythema and lips, buccal mucosa, and tongue normal  NECK: supple, thyroid normal size, non-tender, without nodularity LYMPH:  no palpable cervical, supraclavicular, axillary, or inguinal lymphadenopathy  LUNGS: clear to auscultation bilaterally with normal breathing effort HEART: regular rate & rhythm and no murmurs and no lower extremity edema ABDOMEN:abdomen soft, non-tender and normal bowel sounds. No palpable hepatosplenomegaly Musculoskeletal:no cyanosis of digits and no clubbing  PSYCH: alert & oriented x 3 with fluent speech NEURO: no focal motor/sensory deficits RECTAL patient refused digital rectal exam  LABORATORY DATA:  I have reviewed the data as listed CBC Latest Ref Rng & Units 12/15/2017 11/21/2017 11/12/2014  WBC 4.0 - 10.3 K/uL 10.3 9.8 10.9(H)  Hemoglobin 13.0 - 17.1 g/dL 16.3 16.2 16.1  Hematocrit 38.4 - 49.9 % 49.1 48.5 48.2  Platelets 140 - 400 K/uL 271 262 358   CMP Latest Ref Rng & Units 12/15/2017 11/21/2017 12/12/2014  Glucose 70 -  140 mg/dL 68(L) 120 -  BUN 7 - 26 mg/dL 10 9.2 -  Creatinine 0.70 - 1.30 mg/dL 0.98 1.0 -  Sodium 136 - 145 mmol/L 140 138 -  Potassium 3.5 - 5.1 mmol/L 4.2 4.6 -  Chloride 98 - 109 mmol/L 106 - -  CO2 22 - 29 mmol/L 26 25 -  Calcium 8.4 - 10.4 mg/dL 9.4 9.4 -  Total Protein 6.4 - 8.3 g/dL 7.1 7.2 6.9  Total Bilirubin 0.2 - 1.2 mg/dL 0.4 0.74 0.4  Alkaline Phos 40 - 150 U/L 73 89 65  AST 5 - 34 U/L '17 19 21  '$ ALT 0 - 55 U/L '14 17 18   '$ CEA:  10/20/17: 396 12/15/17: 434.60  RADIOGRAPHIC STUDIES: I have personally reviewed the radiological images as listed and agreed with the findings in the report. Ct Chest Wo Contrast  Result Date: 11/21/2017 CLINICAL DATA:  Rectal cancer diagnosed 1 month ago.  Staging. EXAM: CT CHEST WITHOUT CONTRAST TECHNIQUE: Multidetector CT imaging of the chest was performed following the standard protocol without IV contrast. COMPARISON:  Abdominopelvic CT 10/21/2017. FINDINGS: Cardiovascular: Mild atherosclerosis of the aorta, great vessels and coronary arteries. No acute vascular findings on noncontrast imaging. The heart size is normal. There is no pericardial effusion. Mediastinum/Nodes: There are no enlarged mediastinal, hilar or axillary lymph nodes.Hilar assessment is limited by the lack of intravenous contrast. Probable secretions in the right mainstem bronchus. The trachea, esophagus and thyroid gland demonstrate no significant findings. Lungs/Pleura: There is no pleural effusion. Moderate centrilobular emphysema. Mild central airway thickening and dependent atelectasis at both lung bases. No suspicious pulmonary nodules. Upper abdomen: The visualized upper abdomen appears stable without suspicious findings. Musculoskeletal/Chest wall: There is no chest wall mass or suspicious osseous finding. IMPRESSION: 1. No evidence of thoracic metastatic disease. 2. Moderate centrilobular emphysema. 3. Mild coronary artery and Aortic Atherosclerosis (ICD10-I70.0).  Electronically Signed   By: Richardean Sale M.D.   On: 11/21/2017 16:51    ASSESSMENT & PLAN:  Wesley [name] is a 72 y.o. caucasian male with history of anxiety, depression, tobacco use presenting with painless rectal bleeding, abdominal pain, nausea, diarrhea, fatigue, and 25 lbs weight loss over 1 year.   1. Rectal adenocarcinoma, uT3uN0M0, stag IIA -We reviewed imaging  and pathology results in detail. He has what appears to be early stage rectal cancer, uT3N0,  CT chest, abdomen and pelvis was negative for distant metastasis, this was reviewed with patient. -We discussed standard treatment regimen which includes neoadjuvant chemoradiation with xeloda followed by definitive surgery and possible adjuvant systemic chemo, we discussed potential side effects in detail. The patient is initially not very interested in any treatment, after repeated discussion, he is open to radiation an chemo  -We discussed the benefit of concurrent chemotherapy with 5-FU or Xeloda.  Potential benefits and side effects discussed with him in detail, he is willing to try Xeloda. -He has declined the option of surgery at this time.  -He is starting chemoRT today. I advised him to use nausea and antidiarrhetic medication when needed. I advised him to avoid coffee, drink a lot of water, and eat a balanced diet while taking Xeloda, Monday - Friday.  -Labs reviewed at they are WNL (12/15/17) -pt knows to call if he has a fever or any other changes in health -F/u in 2 weeks    2. Nausea, diarrhea, weight loss -He has intermittent nausea, diarrhea, and 25 pounds weight loss over 1 year.  -He has tried imodium without much relief. I prescribed lomotil to his pharmacy on 11/07/17. For nausea without vomiting, I prescribed Zofran PRN.  -His appetite is low at baseline; eats 1 meal per day. I recommend increasing po intake and placed a referral to dietician.  -Conitnues to have low appetite, stable weight   PLAN: -He is  starting concurrent chemo and radiation today  -f/u with me or Lacie in 2 and 4 weeks  -Continue with Xeloda 1500 mg twice daily Monday through Friday when on radiation -Lab weekly  All questions were answered. The patient knows to call the clinic with any problems, questions or concerns.   I spent 15 minutes counseling the patient face to face. The total time spent in the appointment was 20 minutes and more than 50% was on counseling, review of test results, and coordination of care.    Truitt Merle  12/15/2017   This document serves as a record of services personally performed by Truitt Merle, MD. It was created on her behalf by Theresia Bough, a trained medical scribe. The creation of this record is based on the scribe's personal observations and the provider's statements to them.   I have reviewed the above documentation for accuracy and completeness, and I agree with the above.

## 2017-12-15 NOTE — Telephone Encounter (Signed)
Scheduled appt per 1/14 los - Gave patient AVS and calender per los.  

## 2017-12-16 ENCOUNTER — Ambulatory Visit
Admission: RE | Admit: 2017-12-16 | Discharge: 2017-12-16 | Disposition: A | Discharge status: Home / Self Care | Payer: Medicare HMO | Source: Ambulatory Visit | Attending: Radiation Oncology | Admitting: Radiation Oncology

## 2017-12-16 DIAGNOSIS — J432 Centrilobular emphysema: Secondary | ICD-10-CM | POA: Diagnosis not present

## 2017-12-16 DIAGNOSIS — I2510 Atherosclerotic heart disease of native coronary artery without angina pectoris: Secondary | ICD-10-CM | POA: Diagnosis not present

## 2017-12-16 DIAGNOSIS — Z79899 Other long term (current) drug therapy: Secondary | ICD-10-CM | POA: Diagnosis not present

## 2017-12-16 DIAGNOSIS — Z510 Encounter for antineoplastic radiation therapy: Secondary | ICD-10-CM | POA: Diagnosis not present

## 2017-12-16 DIAGNOSIS — I700 Atherosclerosis of aorta: Secondary | ICD-10-CM | POA: Diagnosis not present

## 2017-12-16 DIAGNOSIS — R69 Illness, unspecified: Secondary | ICD-10-CM | POA: Diagnosis not present

## 2017-12-16 DIAGNOSIS — C20 Malignant neoplasm of rectum: Secondary | ICD-10-CM | POA: Diagnosis not present

## 2017-12-16 DIAGNOSIS — Z8051 Family history of malignant neoplasm of kidney: Secondary | ICD-10-CM | POA: Diagnosis not present

## 2017-12-16 DIAGNOSIS — R634 Abnormal weight loss: Secondary | ICD-10-CM | POA: Diagnosis not present

## 2017-12-16 DIAGNOSIS — Z9049 Acquired absence of other specified parts of digestive tract: Secondary | ICD-10-CM | POA: Diagnosis not present

## 2017-12-17 ENCOUNTER — Ambulatory Visit
Admission: RE | Admit: 2017-12-17 | Discharge: 2017-12-17 | Disposition: A | Discharge status: Home / Self Care | Payer: Medicare HMO | Source: Ambulatory Visit | Attending: Radiation Oncology | Admitting: Radiation Oncology

## 2017-12-17 DIAGNOSIS — R634 Abnormal weight loss: Secondary | ICD-10-CM | POA: Diagnosis not present

## 2017-12-17 DIAGNOSIS — J432 Centrilobular emphysema: Secondary | ICD-10-CM | POA: Diagnosis not present

## 2017-12-17 DIAGNOSIS — Z9049 Acquired absence of other specified parts of digestive tract: Secondary | ICD-10-CM | POA: Diagnosis not present

## 2017-12-17 DIAGNOSIS — C20 Malignant neoplasm of rectum: Secondary | ICD-10-CM | POA: Diagnosis not present

## 2017-12-17 DIAGNOSIS — I2510 Atherosclerotic heart disease of native coronary artery without angina pectoris: Secondary | ICD-10-CM | POA: Diagnosis not present

## 2017-12-17 DIAGNOSIS — Z79899 Other long term (current) drug therapy: Secondary | ICD-10-CM | POA: Diagnosis not present

## 2017-12-17 DIAGNOSIS — Z8051 Family history of malignant neoplasm of kidney: Secondary | ICD-10-CM | POA: Diagnosis not present

## 2017-12-17 DIAGNOSIS — R69 Illness, unspecified: Secondary | ICD-10-CM | POA: Diagnosis not present

## 2017-12-17 DIAGNOSIS — Z510 Encounter for antineoplastic radiation therapy: Secondary | ICD-10-CM | POA: Diagnosis not present

## 2017-12-17 DIAGNOSIS — I700 Atherosclerosis of aorta: Secondary | ICD-10-CM | POA: Diagnosis not present

## 2017-12-18 ENCOUNTER — Ambulatory Visit
Admission: RE | Admit: 2017-12-18 | Discharge: 2017-12-18 | Disposition: A | Discharge status: Home / Self Care | Payer: Medicare HMO | Source: Ambulatory Visit | Attending: Radiation Oncology | Admitting: Radiation Oncology

## 2017-12-18 DIAGNOSIS — Z510 Encounter for antineoplastic radiation therapy: Secondary | ICD-10-CM | POA: Diagnosis not present

## 2017-12-18 DIAGNOSIS — J432 Centrilobular emphysema: Secondary | ICD-10-CM | POA: Diagnosis not present

## 2017-12-18 DIAGNOSIS — Z9049 Acquired absence of other specified parts of digestive tract: Secondary | ICD-10-CM | POA: Diagnosis not present

## 2017-12-18 DIAGNOSIS — Z79899 Other long term (current) drug therapy: Secondary | ICD-10-CM | POA: Diagnosis not present

## 2017-12-18 DIAGNOSIS — R69 Illness, unspecified: Secondary | ICD-10-CM | POA: Diagnosis not present

## 2017-12-18 DIAGNOSIS — C20 Malignant neoplasm of rectum: Secondary | ICD-10-CM | POA: Diagnosis not present

## 2017-12-18 DIAGNOSIS — I2510 Atherosclerotic heart disease of native coronary artery without angina pectoris: Secondary | ICD-10-CM | POA: Diagnosis not present

## 2017-12-18 DIAGNOSIS — I700 Atherosclerosis of aorta: Secondary | ICD-10-CM | POA: Diagnosis not present

## 2017-12-18 DIAGNOSIS — R634 Abnormal weight loss: Secondary | ICD-10-CM | POA: Diagnosis not present

## 2017-12-18 DIAGNOSIS — Z8051 Family history of malignant neoplasm of kidney: Secondary | ICD-10-CM | POA: Diagnosis not present

## 2017-12-19 ENCOUNTER — Ambulatory Visit
Admission: RE | Admit: 2017-12-19 | Discharge: 2017-12-19 | Disposition: A | Discharge status: Home / Self Care | Payer: Medicare HMO | Source: Ambulatory Visit | Attending: Radiation Oncology | Admitting: Radiation Oncology

## 2017-12-19 DIAGNOSIS — Z8051 Family history of malignant neoplasm of kidney: Secondary | ICD-10-CM | POA: Diagnosis not present

## 2017-12-19 DIAGNOSIS — C20 Malignant neoplasm of rectum: Secondary | ICD-10-CM | POA: Diagnosis not present

## 2017-12-19 DIAGNOSIS — J432 Centrilobular emphysema: Secondary | ICD-10-CM | POA: Diagnosis not present

## 2017-12-19 DIAGNOSIS — Z79899 Other long term (current) drug therapy: Secondary | ICD-10-CM | POA: Diagnosis not present

## 2017-12-19 DIAGNOSIS — I2510 Atherosclerotic heart disease of native coronary artery without angina pectoris: Secondary | ICD-10-CM | POA: Diagnosis not present

## 2017-12-19 DIAGNOSIS — I700 Atherosclerosis of aorta: Secondary | ICD-10-CM | POA: Diagnosis not present

## 2017-12-19 DIAGNOSIS — Z510 Encounter for antineoplastic radiation therapy: Secondary | ICD-10-CM | POA: Diagnosis not present

## 2017-12-19 DIAGNOSIS — Z9049 Acquired absence of other specified parts of digestive tract: Secondary | ICD-10-CM | POA: Diagnosis not present

## 2017-12-19 DIAGNOSIS — R634 Abnormal weight loss: Secondary | ICD-10-CM | POA: Diagnosis not present

## 2017-12-19 DIAGNOSIS — R69 Illness, unspecified: Secondary | ICD-10-CM | POA: Diagnosis not present

## 2017-12-22 ENCOUNTER — Ambulatory Visit
Admission: RE | Admit: 2017-12-22 | Discharge: 2017-12-22 | Disposition: A | Discharge status: Home / Self Care | Payer: Medicare HMO | Source: Ambulatory Visit | Attending: Radiation Oncology | Admitting: Radiation Oncology

## 2017-12-22 ENCOUNTER — Inpatient Hospital Stay: Payer: Medicare HMO

## 2017-12-22 DIAGNOSIS — I2510 Atherosclerotic heart disease of native coronary artery without angina pectoris: Secondary | ICD-10-CM | POA: Diagnosis not present

## 2017-12-22 DIAGNOSIS — G4700 Insomnia, unspecified: Secondary | ICD-10-CM | POA: Diagnosis not present

## 2017-12-22 DIAGNOSIS — R197 Diarrhea, unspecified: Secondary | ICD-10-CM | POA: Diagnosis not present

## 2017-12-22 DIAGNOSIS — C20 Malignant neoplasm of rectum: Secondary | ICD-10-CM

## 2017-12-22 DIAGNOSIS — Z8051 Family history of malignant neoplasm of kidney: Secondary | ICD-10-CM | POA: Diagnosis not present

## 2017-12-22 DIAGNOSIS — R69 Illness, unspecified: Secondary | ICD-10-CM | POA: Diagnosis not present

## 2017-12-22 DIAGNOSIS — Z510 Encounter for antineoplastic radiation therapy: Secondary | ICD-10-CM | POA: Diagnosis not present

## 2017-12-22 DIAGNOSIS — J432 Centrilobular emphysema: Secondary | ICD-10-CM | POA: Diagnosis not present

## 2017-12-22 DIAGNOSIS — E785 Hyperlipidemia, unspecified: Secondary | ICD-10-CM | POA: Diagnosis not present

## 2017-12-22 DIAGNOSIS — R634 Abnormal weight loss: Secondary | ICD-10-CM | POA: Diagnosis not present

## 2017-12-22 DIAGNOSIS — Z9049 Acquired absence of other specified parts of digestive tract: Secondary | ICD-10-CM | POA: Diagnosis not present

## 2017-12-22 DIAGNOSIS — R110 Nausea: Secondary | ICD-10-CM | POA: Diagnosis not present

## 2017-12-22 DIAGNOSIS — I700 Atherosclerosis of aorta: Secondary | ICD-10-CM | POA: Diagnosis not present

## 2017-12-22 DIAGNOSIS — Z79899 Other long term (current) drug therapy: Secondary | ICD-10-CM | POA: Diagnosis not present

## 2017-12-22 LAB — CEA (IN HOUSE-CHCC): CEA (CHCC-IN HOUSE): 377.99 ng/mL — AB (ref 0.00–5.00)

## 2017-12-22 LAB — COMPREHENSIVE METABOLIC PANEL
ALT: 14 U/L (ref 0–55)
AST: 18 U/L (ref 5–34)
Albumin: 3.7 g/dL (ref 3.5–5.0)
Alkaline Phosphatase: 72 U/L (ref 40–150)
Anion gap: 8 (ref 3–11)
BILIRUBIN TOTAL: 0.4 mg/dL (ref 0.2–1.2)
BUN: 18 mg/dL (ref 7–26)
CO2: 27 mmol/L (ref 22–29)
CREATININE: 0.86 mg/dL (ref 0.70–1.30)
Calcium: 9.3 mg/dL (ref 8.4–10.4)
Chloride: 104 mmol/L (ref 98–109)
Glucose, Bld: 88 mg/dL (ref 70–140)
Potassium: 5.1 mmol/L (ref 3.5–5.1)
Sodium: 139 mmol/L (ref 136–145)
TOTAL PROTEIN: 7.2 g/dL (ref 6.4–8.3)

## 2017-12-22 LAB — CBC WITH DIFFERENTIAL/PLATELET
BASOS ABS: 0 10*3/uL (ref 0.0–0.1)
Basophils Relative: 1 %
Eosinophils Absolute: 0.1 10*3/uL (ref 0.0–0.5)
Eosinophils Relative: 2 %
HEMATOCRIT: 46.4 % (ref 38.4–49.9)
Hemoglobin: 15.3 g/dL (ref 13.0–17.1)
LYMPHS ABS: 1.5 10*3/uL (ref 0.9–3.3)
LYMPHS PCT: 22 %
MCH: 29.8 pg (ref 27.2–33.4)
MCHC: 33.1 g/dL (ref 32.0–36.0)
MCV: 90.2 fL (ref 79.3–98.0)
MONO ABS: 0.5 10*3/uL (ref 0.1–0.9)
MONOS PCT: 8 %
NEUTROS ABS: 4.6 10*3/uL (ref 1.5–6.5)
Neutrophils Relative %: 67 %
Platelets: 252 10*3/uL (ref 140–400)
RBC: 5.14 MIL/uL (ref 4.20–5.82)
RDW: 13.4 % (ref 11.0–15.6)
WBC: 6.8 10*3/uL (ref 4.0–10.3)

## 2017-12-23 ENCOUNTER — Ambulatory Visit
Admission: RE | Admit: 2017-12-23 | Discharge: 2017-12-23 | Disposition: A | Discharge status: Home / Self Care | Payer: Medicare HMO | Source: Ambulatory Visit | Attending: Radiation Oncology | Admitting: Radiation Oncology

## 2017-12-23 DIAGNOSIS — Z79899 Other long term (current) drug therapy: Secondary | ICD-10-CM | POA: Diagnosis not present

## 2017-12-23 DIAGNOSIS — Z510 Encounter for antineoplastic radiation therapy: Secondary | ICD-10-CM | POA: Diagnosis not present

## 2017-12-23 DIAGNOSIS — R634 Abnormal weight loss: Secondary | ICD-10-CM | POA: Diagnosis not present

## 2017-12-23 DIAGNOSIS — R69 Illness, unspecified: Secondary | ICD-10-CM | POA: Diagnosis not present

## 2017-12-23 DIAGNOSIS — I700 Atherosclerosis of aorta: Secondary | ICD-10-CM | POA: Diagnosis not present

## 2017-12-23 DIAGNOSIS — I2510 Atherosclerotic heart disease of native coronary artery without angina pectoris: Secondary | ICD-10-CM | POA: Diagnosis not present

## 2017-12-23 DIAGNOSIS — Z8051 Family history of malignant neoplasm of kidney: Secondary | ICD-10-CM | POA: Diagnosis not present

## 2017-12-23 DIAGNOSIS — J432 Centrilobular emphysema: Secondary | ICD-10-CM | POA: Diagnosis not present

## 2017-12-23 DIAGNOSIS — C20 Malignant neoplasm of rectum: Secondary | ICD-10-CM | POA: Diagnosis not present

## 2017-12-23 DIAGNOSIS — Z9049 Acquired absence of other specified parts of digestive tract: Secondary | ICD-10-CM | POA: Diagnosis not present

## 2017-12-24 ENCOUNTER — Ambulatory Visit
Admission: RE | Admit: 2017-12-24 | Discharge: 2017-12-24 | Disposition: A | Discharge status: Home / Self Care | Payer: Medicare HMO | Source: Ambulatory Visit | Attending: Radiation Oncology | Admitting: Radiation Oncology

## 2017-12-24 DIAGNOSIS — I700 Atherosclerosis of aorta: Secondary | ICD-10-CM | POA: Diagnosis not present

## 2017-12-24 DIAGNOSIS — R634 Abnormal weight loss: Secondary | ICD-10-CM | POA: Diagnosis not present

## 2017-12-24 DIAGNOSIS — Z9049 Acquired absence of other specified parts of digestive tract: Secondary | ICD-10-CM | POA: Diagnosis not present

## 2017-12-24 DIAGNOSIS — Z8051 Family history of malignant neoplasm of kidney: Secondary | ICD-10-CM | POA: Diagnosis not present

## 2017-12-24 DIAGNOSIS — I2510 Atherosclerotic heart disease of native coronary artery without angina pectoris: Secondary | ICD-10-CM | POA: Diagnosis not present

## 2017-12-24 DIAGNOSIS — Z510 Encounter for antineoplastic radiation therapy: Secondary | ICD-10-CM | POA: Diagnosis not present

## 2017-12-24 DIAGNOSIS — Z79899 Other long term (current) drug therapy: Secondary | ICD-10-CM | POA: Diagnosis not present

## 2017-12-24 DIAGNOSIS — C20 Malignant neoplasm of rectum: Secondary | ICD-10-CM | POA: Diagnosis not present

## 2017-12-24 DIAGNOSIS — R69 Illness, unspecified: Secondary | ICD-10-CM | POA: Diagnosis not present

## 2017-12-24 DIAGNOSIS — J432 Centrilobular emphysema: Secondary | ICD-10-CM | POA: Diagnosis not present

## 2017-12-24 NOTE — Progress Notes (Signed)
[place]  [phone] Fax:([phone]) [phone]  Clinic Follow Up Note   Patient Care Team: Jearld Fenton, NP as PCP - General (Internal Medicine) Carol Ada, MD as Consulting Physician (Gastroenterology) Truitt Merle, MD as Consulting Physician (Hematology) Alla Feeling, NP as Nurse Practitioner (Nurse Practitioner)   Date of Service:  12/29/2017   CHIEF COMPLAINTS:  Follow up rectal cancer    Oncology History   Cancer Staging Rectal adenocarcinoma [facility]) Staging form: Colon and Rectum, AJCC 8th Edition - Clinical stage from 10/31/2017: Stage IIA (cT3, cN0, cM0) - Signed by Truitt Merle, MD on 11/28/2017       Rectal adenocarcinoma ([place])   10/21/2017 Procedure    COLONOSCOPY per Dr. Carol Ada Findings: A fungating, infiltrative and ulcerated non--obstructing large mass was found in the rectum.  The mass was circumferential.  The mass measured 3 cm in length.  Oozing was present.  This was biopsied with a cold snare for histology.  2 sessile polyps were found in the transverse colon and ascending colon.  The polyps were 3-4 mm in size.  The polyps were removed with a cold snare.  Section and retrieval were complete.  A 15 mm polyp was found in the sigmoid colon.  The polyp was semi-pedunculated.  The polyp was removed with a hot snare.  Resection and retrieval were complete  The mass was extremely friable and palpable with a rectal examination.  The distal extent was 5 cm from the dentate line and it was 3 cm in length.  Specimens were obtained using a cold snare.  Retroflexion was not possible with the close proximity of the lesion in the rectum.         10/21/2017 Initial Biopsy    Final microscopic diagnosis A.  Colon, ascending, polyp: Tubular adenoma No high-grade dysplasia or malignancy  B.  Colon, transverse, polyp: Tubular adenoma No high-grade dysplasia or malignancy  C.  Colon, sigmoid, polyp: Tubular adenoma with high-grade  dysplasia and mucosal prolapse  D.  Rectum, mass, biopsy: Invasive adenocarcinoma, moderately differentiated Immunohistochemical stains for ML H1, [place] 2, [place] 6 and PMS 2 are intact (normal)  There is no evidence of DNA mismatch repair deficiency in the carcinoma, indicating that the tumor is most likely microsatellite stable, and that Lynch syndrome (a common cause of hereditary non-polyposis colorectal cancer or HNPCC) is very unlikely      10/21/2017 Imaging    CT A/P W CONTRAST IMPRESSION: 1. Irregular mural and mucosal thickening of the rectum with small right 9 mm short axis index lymph node adjacent to the rectum. 2. Diffuse mild to moderate fluid-filled distention of small bowel loops query ileus or dysmotility. No mechanical source obstruction is seen. 3. Submucosal fatty infiltration of the descending and sigmoid colon possibly representing stigmata of chronic inflammatory bowel disease.       10/21/2017 Initial Diagnosis    Rectal adenocarcinoma ([place])      10/31/2017 Procedure    EUS: Findings: A hypoechoic mass was found in the rectum. The mass was encountered at 5 cm (from the anal verge). The mass was partially circumferential (involving 50% of the lumen). The endosonographic borders were poorly-defined and irregular. The mass measured 30 mm (in maximum length) by 13 mm (in maximum thickness). There was sonographic evidence suggesting breakthrough of the muscularis propria with invasion into the perirectal fat (uT3, manifested by prominent pseudopodia). There was no sonographic evidence of invasion into the adjacent structures. Area was tattooed with an  injection of 3 mL of Spot (carbon black) both proximally and distally. I was not able to identify any significant lymph nodes. - Rectal mass was visualized endosonographically. A tissue diagnosis was obtained prior to this exam. This is of adenocarcinoma. This was staged uT3 uN0. Tattooed. - No specimens collected.       11/21/2017 Imaging    CT Chest 11/21/17  IMPRESSION: 1. No evidence of thoracic metastatic disease. 2. Moderate centrilobular emphysema. 3. Mild coronary artery and Aortic Atherosclerosis (ICD10-I70.0).      12/15/2017 -  Chemotherapy    concurrent chemoradiation with Xeloda 3 tabs BID       12/16/2017 -  Radiation Therapy     concurrent chemoradiation at [place] PRESENTING ILLNESS:  Wesley [name] 72 y.o. male is here because of newly diagnosed rectal cancer. He reports intermittent painless rectal bleeding with associated abdominal pain, diarrhea, nausea, fatigue, and 25 pounds weight loss for 1 year. reported usual weight is 160 lbs. He attributed rectal bleeding to hemorrhoids and delayed seeking medical care because he is distrustful of the health care system. He does not have large appetite in general, eats 1 meal per day at baseline. Denies bloating, cramping, dysphagia, emesis, or early satiety. He presented to medical doctor who ordered colonoscopy which found a fungating, infiltrative, and ulcerated non-obstructing rectal mass, 2 sessile polyps in the transverse and ascending colon and 1 semi-pedunculated polyp in the sigmoid colon. The rectal mass was palpable on digital rectal exam. He then had CT abdomen and pelvis which showed irregular mural and mucosal thickening of the rectum with small right 9 mm short axis index lymph node adjacent to the rectum. EUS confirmed sonographic evidence suggesting breakthrough of the muscularis proprioa with invasion into the perirectal fat; there was no evidence of invasion into adjacent structures; there were no identifiable significant lymph nodes.   In the past he has never had a colonoscopy. He has past medical history anxiety, depression, insomnia, hyperlipidemia, and tobacco use. He has smoked 1 PPD for 56 years. Denies alcohol or other drug use. He is widowed, lives alone, has 3 adult children who are healthy. One  daughter and his sister accompany him to consult. This daughter lives approx 20 minutes away, sister lives 1 hour away. He drives dump truck. He is independent with ADL's.   Today he continues to report painless rectal bleeding, intermittent nausea and diarrhea. Imodium has not improved much. He is moderately fatigued but continues to work. He is willing to consider treatment but does not want to be a burden on his family.    CURRENT THERAPY: Started 12/15/17 concurrent radiation and Xeloda '1500mg'$  BID on days of radiation, changed to 1500 mg in the morning and 1000 mg in the evening on 12/29/17 due to fatigue   INTERVAL HISTORY  Wesley [name] is here for a follow up. He presents to the clinic today by himself. Pt states he is not doing well. He is feeling frustrated with his situation and is growing more tired. He didn't take Xeloda last Friday but states he feels better having the 3 days off. He has started back with Xeloda today. He reports that he feels nauseated when he wakes up and takes medication to help.   On review of systems, pt denies no pain, chest pain during a bowel movement, diarrhea, or any other complaints at this time. Pertinent positives are listed and detailed within the above HPI.  REVIEW OF SYSTEMS:  Constitutional: Denies fevers, chills or abnormal night sweats (+) low appetite at baseline (+) stable weight   Eyes: Denies blurriness of vision, double vision or watery eyes Ears, nose, mouth, throat, and face: Denies mucositis or sore throat Respiratory: Denies dyspnea or wheezes (+) "smoker's cough" Cardiovascular: Denies palpitation, chest discomfort or lower extremity swelling Gastrointestinal:  Denies abdominal pain, bloating, cramping, early satiety, constipation, emesis, heartburn  (+) occasional painless rectal bleeding x1 year  Skin: Denies abnormal skin rashes Lymphatics: Denies new lymphadenopathy or easy bruising Neurological:Denies numbness, tingling or  new weaknesses  Behavioral/Psych: Mood is stable, no new changes (+) depression (+) anxiety All other systems were reviewed with the patient and are negative.   MEDICAL HISTORY:  Past Medical History:  Diagnosis Date  . Cancer ([place]) 10/21/2017   rectal cancer  . Rectal adenocarcinoma ([place]) 11/07/2017    SURGICAL HISTORY: Past Surgical History:  Procedure Laterality Date  . CHOLECYSTECTOMY    . EUS N/A 10/31/2017   Procedure: LOWER ENDOSCOPIC ULTRASOUND (EUS);  Surgeon: Carol Ada, MD;  Location: [facility];  Service: Endoscopy;  Laterality: N/A;    SOCIAL HISTORY: Social History   Socioeconomic History  . Marital status: Widowed    Spouse name: Not on file  . Number of children: 3  . Years of education: Not on file  . Highest education level: Not on file  Social Needs  . Financial resource strain: Not on file  . Food insecurity - worry: Not on file  . Food insecurity - inability: Not on file  . Transportation needs - medical: Not on file  . Transportation needs - non-medical: Not on file  Occupational History  . Occupation: truck Geophysicist/field seismologist  Tobacco Use  . Smoking status: Current Every Day Smoker    Packs/day: 1.00    Years: 56.00    Pack years: 56.00    Types: Cigarettes  . Smokeless tobacco: Never Used  Substance and Sexual Activity  . Alcohol use: No    Alcohol/week: 0.0 oz  . Drug use: No  . Sexual activity: Not Currently  Other Topics Concern  . Not on file  Social History Narrative  . Not on file    FAMILY HISTORY: Family History  Problem Relation Age of Onset  . Cancer Mother        Kidney  . Diabetes Mother   . Cancer Paternal Uncle        colon  . Cancer Paternal Aunt 50       breast cancer  . Hyperlipidemia Neg Hx   . Hypertension Neg Hx   . Stroke Neg Hx     ALLERGIES:  has No Known Allergies.  MEDICATIONS:  Current Outpatient Medications  Medication Sig Dispense Refill  . capecitabine (XELODA) 500 MG tablet Take 3 tablets (1,500  mg total) by mouth 2 (two) times daily after a meal. Take Monday through Friday when on radiation treatment. 180 tablet 0  . diphenoxylate-atropine (LOMOTIL) 2.5-0.025 MG tablet Take 1 tablet by mouth 4 (four) times daily as needed for diarrhea or loose stools. (Patient not taking: Reported on 12/15/2017) 30 tablet 0  . ondansetron (ZOFRAN) 8 MG tablet Take 1 tablet (8 mg total) by mouth every 8 (eight) hours as needed for nausea or vomiting. 20 tablet 0   No current facility-administered medications for this visit.     PHYSICAL EXAMINATION:  ECOG PERFORMANCE STATUS: 1 - Symptomatic but completely ambulatory  There were no vitals filed for this visit.  There were no vitals filed for this visit.  GENERAL:alert, no distress and comfortable SKIN: skin color, texture, turgor are normal, no rashes or significant lesions EYES: normal, conjunctiva are pink and non-injected, sclera clear OROPHARYNX:no exudate, no erythema and lips, buccal mucosa, and tongue normal  NECK: supple, thyroid normal size, non-tender, without nodularity LYMPH:  no palpable cervical, supraclavicular, axillary, or inguinal lymphadenopathy  LUNGS: clear to auscultation bilaterally with normal breathing effort HEART: regular rate & rhythm and no murmurs and no lower extremity edema ABDOMEN:abdomen soft, non-tender and normal bowel sounds. No palpable hepatosplenomegaly Musculoskeletal:no cyanosis of digits and no clubbing  PSYCH: alert & oriented x 3 with fluent speech NEURO: no focal motor/sensory deficits RECTAL patient refused digital rectal exam  LABORATORY DATA:  I have reviewed the data as listed CBC Latest Ref Rng & Units 12/29/2017 12/22/2017 12/15/2017  WBC 4.0 - 10.3 K/uL 7.8 6.8 10.3  Hemoglobin 13.0 - 17.1 g/dL 15.2 15.3 16.3  Hematocrit 38.4 - 49.9 % 45.6 46.4 49.1  Platelets 140 - 400 K/uL 197 252 271   CMP Latest Ref Rng & Units 12/29/2017 12/22/2017 12/15/2017  Glucose 70 - 140 mg/dL 112 88 68(L)  BUN 7 -  26 mg/dL '10 18 10  '$ Creatinine 0.70 - 1.30 mg/dL 0.86 0.86 0.98  Sodium 136 - 145 mmol/L 140 139 140  Potassium 3.5 - 5.1 mmol/L 3.7 5.1 4.2  Chloride 98 - 109 mmol/L 105 104 106  CO2 22 - 29 mmol/L '26 27 26  '$ Calcium 8.4 - 10.4 mg/dL 9.0 9.3 9.4  Total Protein 6.4 - 8.3 g/dL 7.0 7.2 7.1  Total Bilirubin 0.2 - 1.2 mg/dL 0.3 0.4 0.4  Alkaline Phos 40 - 150 U/L 72 72 73  AST 5 - 34 U/L '16 18 17  '$ ALT 0 - 55 U/L '14 14 14   '$ CEA:  10/20/17: 396 12/15/17: 434.60  RADIOGRAPHIC STUDIES: I have personally reviewed the radiological images as listed and agreed with the findings in the report. No results found.   CT Chest WO Contrast 11/21/17 IMPRESSION: 1. No evidence of thoracic metastatic disease. 2. Moderate centrilobular emphysema. 3. Mild coronary artery and Aortic Atherosclerosis (ICD10-I70.0).  CT AP W Contrast 10/21/17 IMPRESSION: 1. Irregular mural and mucosal thickening of the rectum with small right 9 mm short axis index lymph node adjacent to the rectum. 2. Diffuse mild to moderate fluid-filled distention of small bowel loops query ileus or dysmotility. No mechanical source obstruction is seen. 3. Submucosal fatty infiltration of the descending and sigmoid colon possibly representing stigmata of chronic inflammatory bowel disease.  ASSESSMENT & PLAN:  Wesley [name] is a 72 y.o. caucasian male with history of anxiety, depression, tobacco use presenting with painless rectal bleeding, abdominal pain, nausea, diarrhea, fatigue, and 25 lbs weight loss over 1 year.   1. Rectal adenocarcinoma, uT3uN0M0, stag IIA -We reviewed imaging and pathology results in detail. He has what appears to be early stage rectal cancer, uT3N0,  CT chest, abdomen and pelvis was negative for distant metastasis, this was reviewed with patient. -We discussed standard treatment regimen which includes neoadjuvant chemoradiation with xeloda followed by definitive surgery and possible adjuvant systemic  chemo, we discussed potential side effects in detail. The patient is initially not very interested in any treatment, after repeated discussion, he is open to radiation an chemo  -We discussed the benefit of concurrent chemotherapy with 5-FU or Xeloda.  Potential benefits and side effects discussed with him in detail, he is willing to try Xeloda. -He  has declined the option of surgery at this time.  -He has started chemoRT.  Developed severe fatigue towards the end of second week treatment, and skipped one day treatment. -Due to his fatigue, I will reduce his Xeloda to 1500 mg in the morning and 1000 mg in the evening. I encourage him to continue radiation, which is his main treatment, and try not to skip treatment. -I advised him to call if his fatigue becomes worse, we can always hold Xeloda if needed.  -F/u next Friday    2. Nausea, diarrhea, weight loss -He has intermittent nausea, diarrhea, and 25 pounds weight loss over 1 year.  -He has tried imodium without much relief. I prescribed lomotil to his pharmacy on 11/07/17. For nausea without vomiting, I prescribed Zofran PRN.  -His appetite is low at baseline; eats 1 meal per day. I recommend increasing po intake and placed a referral to dietician.  -Conitnues to have low appetite, stable weight   PLAN: ---Continue radiation and concurrent chemo, will reduce Xeloda  From 1500 mg bid to '1500mg'$  in the morning and 1000 mg in the evening, Monday through Friday when on radiation -lab weekly -f/u with Lacie next Friday (per his daughter's request, Friday works better for her)   All questions were answered. The patient knows to call the clinic with any problems, questions or concerns.   I spent 15 minutes counseling the patient face to face. The total time spent in the appointment was 20 minutes and more than 50% was on counseling, review of test results, and coordination of care.    Truitt Merle  12/29/2017    This document serves as a record of  services personally performed by Truitt Merle, MD. It was created on her behalf by Theresia Bough, a trained medical scribe. The creation of this record is based on the scribe's personal observations and the provider's statements to them.   I have reviewed the above documentation for accuracy and completeness, and I agree with the above.  Marland Kitchen

## 2017-12-25 ENCOUNTER — Ambulatory Visit
Admission: RE | Admit: 2017-12-25 | Discharge: 2017-12-25 | Disposition: A | Discharge status: Home / Self Care | Payer: Medicare HMO | Source: Ambulatory Visit | Attending: Radiation Oncology | Admitting: Radiation Oncology

## 2017-12-25 DIAGNOSIS — Z8051 Family history of malignant neoplasm of kidney: Secondary | ICD-10-CM | POA: Diagnosis not present

## 2017-12-25 DIAGNOSIS — Z510 Encounter for antineoplastic radiation therapy: Secondary | ICD-10-CM | POA: Diagnosis not present

## 2017-12-25 DIAGNOSIS — Z79899 Other long term (current) drug therapy: Secondary | ICD-10-CM | POA: Diagnosis not present

## 2017-12-25 DIAGNOSIS — C20 Malignant neoplasm of rectum: Secondary | ICD-10-CM | POA: Diagnosis not present

## 2017-12-25 DIAGNOSIS — I700 Atherosclerosis of aorta: Secondary | ICD-10-CM | POA: Diagnosis not present

## 2017-12-25 DIAGNOSIS — R69 Illness, unspecified: Secondary | ICD-10-CM | POA: Diagnosis not present

## 2017-12-25 DIAGNOSIS — J432 Centrilobular emphysema: Secondary | ICD-10-CM | POA: Diagnosis not present

## 2017-12-25 DIAGNOSIS — I2510 Atherosclerotic heart disease of native coronary artery without angina pectoris: Secondary | ICD-10-CM | POA: Diagnosis not present

## 2017-12-25 DIAGNOSIS — Z9049 Acquired absence of other specified parts of digestive tract: Secondary | ICD-10-CM | POA: Diagnosis not present

## 2017-12-25 DIAGNOSIS — R634 Abnormal weight loss: Secondary | ICD-10-CM | POA: Diagnosis not present

## 2017-12-26 ENCOUNTER — Telehealth: Payer: Self-pay | Admitting: *Deleted

## 2017-12-26 ENCOUNTER — Ambulatory Visit: Payer: Medicare HMO

## 2017-12-26 NOTE — Telephone Encounter (Signed)
Received vm call from pt's daughter, Lattie Haw asking for call back.  Call returned & she states that her dad told someone that he spoke to yesterday that he wasn't coming for radiation treatment today b/c he just doesn't feel well.  She states that he doesn't tell her anything specific.  He isn't taking his xeloda today due to no radiation.  She also wants to know if his appts can be moved to [place] since she is off on fridays & would like to come to appts with her dad.  Informed that this would be discussed with Dr Burr Medico.  Called pt & he says that he just feels like he has been run over by a [employer] truck, tired, no energy.  He states he doesn't need anything & is just going to rest today.  Informed daughter after discussing wit Dr Burr Medico & keep Monday appt & she will try to have other daughter or pt's sister to come with him.  We can try to move future appts to fridays.  Dr Burr Medico may have to reduce or d/c xeloda depending on how he is tolerating. Lattie Haw expressed understanding.

## 2017-12-29 ENCOUNTER — Inpatient Hospital Stay (HOSPITAL_BASED_OUTPATIENT_CLINIC_OR_DEPARTMENT_OTHER): Payer: Medicare HMO | Admitting: Hematology

## 2017-12-29 ENCOUNTER — Inpatient Hospital Stay: Payer: Medicare HMO

## 2017-12-29 ENCOUNTER — Ambulatory Visit
Admission: RE | Admit: 2017-12-29 | Discharge: 2017-12-29 | Disposition: A | Discharge status: Home / Self Care | Payer: Medicare HMO | Source: Ambulatory Visit | Attending: Radiation Oncology | Admitting: Radiation Oncology

## 2017-12-29 ENCOUNTER — Encounter: Payer: Self-pay | Admitting: Hematology

## 2017-12-29 ENCOUNTER — Other Ambulatory Visit: Payer: Medicare HMO

## 2017-12-29 DIAGNOSIS — G4700 Insomnia, unspecified: Secondary | ICD-10-CM | POA: Diagnosis not present

## 2017-12-29 DIAGNOSIS — R634 Abnormal weight loss: Secondary | ICD-10-CM | POA: Diagnosis not present

## 2017-12-29 DIAGNOSIS — R110 Nausea: Secondary | ICD-10-CM

## 2017-12-29 DIAGNOSIS — E785 Hyperlipidemia, unspecified: Secondary | ICD-10-CM | POA: Diagnosis not present

## 2017-12-29 DIAGNOSIS — Z79899 Other long term (current) drug therapy: Secondary | ICD-10-CM

## 2017-12-29 DIAGNOSIS — Z9049 Acquired absence of other specified parts of digestive tract: Secondary | ICD-10-CM | POA: Diagnosis not present

## 2017-12-29 DIAGNOSIS — Z510 Encounter for antineoplastic radiation therapy: Secondary | ICD-10-CM | POA: Diagnosis not present

## 2017-12-29 DIAGNOSIS — F17210 Nicotine dependence, cigarettes, uncomplicated: Secondary | ICD-10-CM | POA: Diagnosis not present

## 2017-12-29 DIAGNOSIS — Z8051 Family history of malignant neoplasm of kidney: Secondary | ICD-10-CM | POA: Diagnosis not present

## 2017-12-29 DIAGNOSIS — I700 Atherosclerosis of aorta: Secondary | ICD-10-CM | POA: Diagnosis not present

## 2017-12-29 DIAGNOSIS — R197 Diarrhea, unspecified: Secondary | ICD-10-CM | POA: Diagnosis not present

## 2017-12-29 DIAGNOSIS — I2510 Atherosclerotic heart disease of native coronary artery without angina pectoris: Secondary | ICD-10-CM | POA: Diagnosis not present

## 2017-12-29 DIAGNOSIS — R69 Illness, unspecified: Secondary | ICD-10-CM | POA: Diagnosis not present

## 2017-12-29 DIAGNOSIS — C20 Malignant neoplasm of rectum: Secondary | ICD-10-CM | POA: Diagnosis not present

## 2017-12-29 DIAGNOSIS — J432 Centrilobular emphysema: Secondary | ICD-10-CM | POA: Diagnosis not present

## 2017-12-29 LAB — CBC WITH DIFFERENTIAL/PLATELET
BASOS ABS: 0 10*3/uL (ref 0.0–0.1)
Basophils Relative: 0 %
EOS PCT: 4 %
Eosinophils Absolute: 0.3 10*3/uL (ref 0.0–0.5)
HCT: 45.6 % (ref 38.4–49.9)
Hemoglobin: 15.2 g/dL (ref 13.0–17.1)
LYMPHS PCT: 14 %
Lymphs Abs: 1.1 10*3/uL (ref 0.9–3.3)
MCH: 30.5 pg (ref 27.2–33.4)
MCHC: 33.4 g/dL (ref 32.0–36.0)
MCV: 91.3 fL (ref 79.3–98.0)
MONO ABS: 0.6 10*3/uL (ref 0.1–0.9)
MONOS PCT: 8 %
Neutro Abs: 5.8 10*3/uL (ref 1.5–6.5)
Neutrophils Relative %: 74 %
PLATELETS: 197 10*3/uL (ref 140–400)
RBC: 4.99 MIL/uL (ref 4.20–5.82)
RDW: 14 % (ref 11.0–15.6)
WBC: 7.8 10*3/uL (ref 4.0–10.3)

## 2017-12-29 LAB — COMPREHENSIVE METABOLIC PANEL
ALBUMIN: 3.7 g/dL (ref 3.5–5.0)
ALK PHOS: 72 U/L (ref 40–150)
ALT: 14 U/L (ref 0–55)
AST: 16 U/L (ref 5–34)
Anion gap: 9 (ref 3–11)
BUN: 10 mg/dL (ref 7–26)
CALCIUM: 9 mg/dL (ref 8.4–10.4)
CO2: 26 mmol/L (ref 22–29)
Chloride: 105 mmol/L (ref 98–109)
Creatinine, Ser: 0.86 mg/dL (ref 0.70–1.30)
GFR calc Af Amer: >60 mL/min (ref >60)
GFR calc non Af Amer: >60 mL/min (ref >60)
GLUCOSE: 112 mg/dL (ref 70–140)
POTASSIUM: 3.7 mmol/L (ref 3.5–5.1)
Sodium: 140 mmol/L (ref 136–145)
TOTAL PROTEIN: 7 g/dL (ref 6.4–8.3)
Total Bilirubin: 0.3 mg/dL (ref 0.2–1.2)

## 2017-12-30 ENCOUNTER — Other Ambulatory Visit: Payer: Self-pay | Admitting: Hematology

## 2017-12-30 ENCOUNTER — Ambulatory Visit
Admission: RE | Admit: 2017-12-30 | Discharge: 2017-12-30 | Disposition: A | Discharge status: Home / Self Care | Payer: Medicare HMO | Source: Ambulatory Visit | Attending: Radiation Oncology | Admitting: Radiation Oncology

## 2017-12-30 ENCOUNTER — Telehealth: Payer: Self-pay | Admitting: Hematology

## 2017-12-30 DIAGNOSIS — Z8051 Family history of malignant neoplasm of kidney: Secondary | ICD-10-CM | POA: Diagnosis not present

## 2017-12-30 DIAGNOSIS — Z9049 Acquired absence of other specified parts of digestive tract: Secondary | ICD-10-CM | POA: Diagnosis not present

## 2017-12-30 DIAGNOSIS — I700 Atherosclerosis of aorta: Secondary | ICD-10-CM | POA: Diagnosis not present

## 2017-12-30 DIAGNOSIS — Z510 Encounter for antineoplastic radiation therapy: Secondary | ICD-10-CM | POA: Diagnosis not present

## 2017-12-30 DIAGNOSIS — C20 Malignant neoplasm of rectum: Secondary | ICD-10-CM | POA: Diagnosis not present

## 2017-12-30 DIAGNOSIS — Z79899 Other long term (current) drug therapy: Secondary | ICD-10-CM | POA: Diagnosis not present

## 2017-12-30 DIAGNOSIS — J432 Centrilobular emphysema: Secondary | ICD-10-CM | POA: Diagnosis not present

## 2017-12-30 DIAGNOSIS — I2510 Atherosclerotic heart disease of native coronary artery without angina pectoris: Secondary | ICD-10-CM | POA: Diagnosis not present

## 2017-12-30 DIAGNOSIS — R634 Abnormal weight loss: Secondary | ICD-10-CM | POA: Diagnosis not present

## 2017-12-30 DIAGNOSIS — R69 Illness, unspecified: Secondary | ICD-10-CM | POA: Diagnosis not present

## 2017-12-30 NOTE — Telephone Encounter (Signed)
Called patient to schedule appointments per 1/29 sched message from Dr Burr Medico.  He is aware of Date/Time

## 2017-12-31 ENCOUNTER — Ambulatory Visit
Admission: RE | Admit: 2017-12-31 | Discharge: 2017-12-31 | Disposition: A | Discharge status: Home / Self Care | Payer: Medicare HMO | Source: Ambulatory Visit | Attending: Radiation Oncology | Admitting: Radiation Oncology

## 2017-12-31 DIAGNOSIS — R634 Abnormal weight loss: Secondary | ICD-10-CM | POA: Diagnosis not present

## 2017-12-31 DIAGNOSIS — Z9049 Acquired absence of other specified parts of digestive tract: Secondary | ICD-10-CM | POA: Diagnosis not present

## 2017-12-31 DIAGNOSIS — J432 Centrilobular emphysema: Secondary | ICD-10-CM | POA: Diagnosis not present

## 2017-12-31 DIAGNOSIS — Z79899 Other long term (current) drug therapy: Secondary | ICD-10-CM | POA: Diagnosis not present

## 2017-12-31 DIAGNOSIS — C20 Malignant neoplasm of rectum: Secondary | ICD-10-CM | POA: Diagnosis not present

## 2017-12-31 DIAGNOSIS — R69 Illness, unspecified: Secondary | ICD-10-CM | POA: Diagnosis not present

## 2017-12-31 DIAGNOSIS — I2510 Atherosclerotic heart disease of native coronary artery without angina pectoris: Secondary | ICD-10-CM | POA: Diagnosis not present

## 2017-12-31 DIAGNOSIS — I700 Atherosclerosis of aorta: Secondary | ICD-10-CM | POA: Diagnosis not present

## 2017-12-31 DIAGNOSIS — Z8051 Family history of malignant neoplasm of kidney: Secondary | ICD-10-CM | POA: Diagnosis not present

## 2017-12-31 DIAGNOSIS — Z510 Encounter for antineoplastic radiation therapy: Secondary | ICD-10-CM | POA: Diagnosis not present

## 2018-01-01 ENCOUNTER — Ambulatory Visit
Admission: RE | Admit: 2018-01-01 | Discharge: 2018-01-01 | Disposition: A | Discharge status: Home / Self Care | Payer: Medicare HMO | Source: Ambulatory Visit | Attending: Radiation Oncology | Admitting: Radiation Oncology

## 2018-01-01 ENCOUNTER — Other Ambulatory Visit: Payer: Self-pay | Admitting: *Deleted

## 2018-01-01 ENCOUNTER — Telehealth: Payer: Self-pay | Admitting: *Deleted

## 2018-01-01 DIAGNOSIS — Z9049 Acquired absence of other specified parts of digestive tract: Secondary | ICD-10-CM | POA: Diagnosis not present

## 2018-01-01 DIAGNOSIS — Z8051 Family history of malignant neoplasm of kidney: Secondary | ICD-10-CM | POA: Diagnosis not present

## 2018-01-01 DIAGNOSIS — Z79899 Other long term (current) drug therapy: Secondary | ICD-10-CM | POA: Diagnosis not present

## 2018-01-01 DIAGNOSIS — J432 Centrilobular emphysema: Secondary | ICD-10-CM | POA: Diagnosis not present

## 2018-01-01 DIAGNOSIS — I2510 Atherosclerotic heart disease of native coronary artery without angina pectoris: Secondary | ICD-10-CM | POA: Diagnosis not present

## 2018-01-01 DIAGNOSIS — Z510 Encounter for antineoplastic radiation therapy: Secondary | ICD-10-CM | POA: Diagnosis not present

## 2018-01-01 DIAGNOSIS — I700 Atherosclerosis of aorta: Secondary | ICD-10-CM | POA: Diagnosis not present

## 2018-01-01 DIAGNOSIS — C20 Malignant neoplasm of rectum: Secondary | ICD-10-CM | POA: Diagnosis not present

## 2018-01-01 DIAGNOSIS — R634 Abnormal weight loss: Secondary | ICD-10-CM | POA: Diagnosis not present

## 2018-01-01 DIAGNOSIS — R69 Illness, unspecified: Secondary | ICD-10-CM | POA: Diagnosis not present

## 2018-01-01 MED ORDER — CAPECITABINE 500 MG PO TABS
1500.0000 mg | ORAL_TABLET | Freq: Two times a day (BID) | ORAL | 0 refills | Status: DC
Start: 2018-01-01 — End: 2018-01-02

## 2018-01-01 NOTE — Telephone Encounter (Signed)
Spoke with pt, and was informed that pt is taking Xeloda 1500 mg AM and 1000 mg PM on radiation days M - F.  Stated tolerating meds without problems.  Pt has 43 tablets left. Xeloda copay is  $ 90.00 . Will inform Dr. Burr Medico for further instructions on quantity of Xeloda refill.

## 2018-01-02 ENCOUNTER — Ambulatory Visit
Admission: RE | Admit: 2018-01-02 | Discharge: 2018-01-02 | Disposition: A | Discharge status: Home / Self Care | Payer: Medicare HMO | Source: Ambulatory Visit | Attending: Radiation Oncology | Admitting: Radiation Oncology

## 2018-01-02 ENCOUNTER — Other Ambulatory Visit: Payer: Self-pay | Admitting: *Deleted

## 2018-01-02 DIAGNOSIS — I2510 Atherosclerotic heart disease of native coronary artery without angina pectoris: Secondary | ICD-10-CM | POA: Diagnosis not present

## 2018-01-02 DIAGNOSIS — Z510 Encounter for antineoplastic radiation therapy: Secondary | ICD-10-CM | POA: Diagnosis not present

## 2018-01-02 DIAGNOSIS — C20 Malignant neoplasm of rectum: Secondary | ICD-10-CM

## 2018-01-02 DIAGNOSIS — R69 Illness, unspecified: Secondary | ICD-10-CM | POA: Diagnosis not present

## 2018-01-02 DIAGNOSIS — Z9049 Acquired absence of other specified parts of digestive tract: Secondary | ICD-10-CM | POA: Diagnosis not present

## 2018-01-02 DIAGNOSIS — Z79899 Other long term (current) drug therapy: Secondary | ICD-10-CM | POA: Diagnosis not present

## 2018-01-02 DIAGNOSIS — Z8051 Family history of malignant neoplasm of kidney: Secondary | ICD-10-CM | POA: Diagnosis not present

## 2018-01-02 DIAGNOSIS — R634 Abnormal weight loss: Secondary | ICD-10-CM | POA: Diagnosis not present

## 2018-01-02 DIAGNOSIS — J432 Centrilobular emphysema: Secondary | ICD-10-CM | POA: Diagnosis not present

## 2018-01-02 DIAGNOSIS — I700 Atherosclerosis of aorta: Secondary | ICD-10-CM | POA: Diagnosis not present

## 2018-01-02 MED ORDER — CAPECITABINE 500 MG PO TABS: 1500.0000 mg | ORAL_TABLET | Freq: Two times a day (BID) | ORAL | 0 refills | Status: DC

## 2018-01-02 MED FILL — CAPECITABINE 500 MG TABLET: 500 | 9 days supply | Qty: 45 | Fill #0

## 2018-01-02 NOTE — Telephone Encounter (Signed)
Please refill it for the rest of his radiation course. Thanks   Truitt Merle MD

## 2018-01-05 ENCOUNTER — Inpatient Hospital Stay: Payer: Medicare HMO | Attending: Hematology

## 2018-01-05 ENCOUNTER — Ambulatory Visit
Admission: RE | Admit: 2018-01-05 | Discharge: 2018-01-05 | Disposition: A | Discharge status: Home / Self Care | Payer: Medicare HMO | Source: Ambulatory Visit | Attending: Radiation Oncology | Admitting: Radiation Oncology

## 2018-01-05 DIAGNOSIS — F329 Major depressive disorder, single episode, unspecified: Secondary | ICD-10-CM | POA: Diagnosis not present

## 2018-01-05 DIAGNOSIS — D696 Thrombocytopenia, unspecified: Secondary | ICD-10-CM | POA: Diagnosis not present

## 2018-01-05 DIAGNOSIS — R197 Diarrhea, unspecified: Secondary | ICD-10-CM | POA: Insufficient documentation

## 2018-01-05 DIAGNOSIS — Z510 Encounter for antineoplastic radiation therapy: Secondary | ICD-10-CM | POA: Diagnosis not present

## 2018-01-05 DIAGNOSIS — F419 Anxiety disorder, unspecified: Secondary | ICD-10-CM | POA: Insufficient documentation

## 2018-01-05 DIAGNOSIS — Z8051 Family history of malignant neoplasm of kidney: Secondary | ICD-10-CM | POA: Diagnosis not present

## 2018-01-05 DIAGNOSIS — F17210 Nicotine dependence, cigarettes, uncomplicated: Secondary | ICD-10-CM | POA: Insufficient documentation

## 2018-01-05 DIAGNOSIS — Z79899 Other long term (current) drug therapy: Secondary | ICD-10-CM | POA: Insufficient documentation

## 2018-01-05 DIAGNOSIS — C20 Malignant neoplasm of rectum: Secondary | ICD-10-CM | POA: Diagnosis not present

## 2018-01-05 DIAGNOSIS — R110 Nausea: Secondary | ICD-10-CM | POA: Diagnosis not present

## 2018-01-05 DIAGNOSIS — R42 Dizziness and giddiness: Secondary | ICD-10-CM | POA: Diagnosis not present

## 2018-01-05 DIAGNOSIS — R634 Abnormal weight loss: Secondary | ICD-10-CM | POA: Insufficient documentation

## 2018-01-05 DIAGNOSIS — Z9049 Acquired absence of other specified parts of digestive tract: Secondary | ICD-10-CM | POA: Diagnosis not present

## 2018-01-05 DIAGNOSIS — J432 Centrilobular emphysema: Secondary | ICD-10-CM | POA: Diagnosis not present

## 2018-01-05 DIAGNOSIS — R69 Illness, unspecified: Secondary | ICD-10-CM | POA: Diagnosis not present

## 2018-01-05 DIAGNOSIS — I2510 Atherosclerotic heart disease of native coronary artery without angina pectoris: Secondary | ICD-10-CM | POA: Diagnosis not present

## 2018-01-05 DIAGNOSIS — I700 Atherosclerosis of aorta: Secondary | ICD-10-CM | POA: Diagnosis not present

## 2018-01-05 LAB — COMPREHENSIVE METABOLIC PANEL
ALBUMIN: 3.8 g/dL (ref 3.5–5.0)
ALK PHOS: 66 U/L (ref 40–150)
ALT: 13 U/L (ref 0–55)
AST: 18 U/L (ref 5–34)
Anion gap: 8 (ref 3–11)
BILIRUBIN TOTAL: 0.5 mg/dL (ref 0.2–1.2)
BUN: 14 mg/dL (ref 7–26)
CALCIUM: 8.8 mg/dL (ref 8.4–10.4)
CO2: 27 mmol/L (ref 22–29)
Chloride: 104 mmol/L (ref 98–109)
Creatinine, Ser: 0.88 mg/dL (ref 0.70–1.30)
GFR calc Af Amer: >60 mL/min (ref >60)
GFR calc non Af Amer: >60 mL/min (ref >60)
GLUCOSE: 103 mg/dL (ref 70–140)
Potassium: 3.6 mmol/L (ref 3.5–5.1)
SODIUM: 139 mmol/L (ref 136–145)
TOTAL PROTEIN: 6.7 g/dL (ref 6.4–8.3)

## 2018-01-05 LAB — CBC WITH DIFFERENTIAL/PLATELET
BASOS ABS: 0 10*3/uL (ref 0.0–0.1)
BASOS PCT: 1 %
EOS ABS: 0.4 10*3/uL (ref 0.0–0.5)
EOS PCT: 5 %
HEMATOCRIT: 42.1 % (ref 38.4–49.9)
Hemoglobin: 14.4 g/dL (ref 13.0–17.1)
Lymphocytes Relative: 15 %
Lymphs Abs: 1.1 10*3/uL (ref 0.9–3.3)
MCH: 30.8 pg (ref 27.2–33.4)
MCHC: 34.2 g/dL (ref 32.0–36.0)
MCV: 90.1 fL (ref 79.3–98.0)
MONO ABS: 0.8 10*3/uL (ref 0.1–0.9)
MONOS PCT: 10 %
Neutro Abs: 5.3 10*3/uL (ref 1.5–6.5)
Neutrophils Relative %: 69 %
PLATELETS: 147 10*3/uL (ref 140–400)
RBC: 4.67 MIL/uL (ref 4.20–5.82)
RDW: 14.6 % (ref 11.0–14.6)
WBC: 7.6 10*3/uL (ref 4.0–10.3)

## 2018-01-06 ENCOUNTER — Ambulatory Visit
Admission: RE | Admit: 2018-01-06 | Discharge: 2018-01-06 | Disposition: A | Discharge status: Home / Self Care | Payer: Medicare HMO | Source: Ambulatory Visit | Attending: Radiation Oncology | Admitting: Radiation Oncology

## 2018-01-06 DIAGNOSIS — R69 Illness, unspecified: Secondary | ICD-10-CM | POA: Diagnosis not present

## 2018-01-06 DIAGNOSIS — Z510 Encounter for antineoplastic radiation therapy: Secondary | ICD-10-CM | POA: Diagnosis not present

## 2018-01-06 DIAGNOSIS — C20 Malignant neoplasm of rectum: Secondary | ICD-10-CM | POA: Diagnosis not present

## 2018-01-06 DIAGNOSIS — J432 Centrilobular emphysema: Secondary | ICD-10-CM | POA: Diagnosis not present

## 2018-01-06 DIAGNOSIS — R634 Abnormal weight loss: Secondary | ICD-10-CM | POA: Diagnosis not present

## 2018-01-06 DIAGNOSIS — I700 Atherosclerosis of aorta: Secondary | ICD-10-CM | POA: Diagnosis not present

## 2018-01-06 DIAGNOSIS — Z79899 Other long term (current) drug therapy: Secondary | ICD-10-CM | POA: Diagnosis not present

## 2018-01-06 DIAGNOSIS — Z9049 Acquired absence of other specified parts of digestive tract: Secondary | ICD-10-CM | POA: Diagnosis not present

## 2018-01-06 DIAGNOSIS — Z8051 Family history of malignant neoplasm of kidney: Secondary | ICD-10-CM | POA: Diagnosis not present

## 2018-01-06 DIAGNOSIS — I2510 Atherosclerotic heart disease of native coronary artery without angina pectoris: Secondary | ICD-10-CM | POA: Diagnosis not present

## 2018-01-07 ENCOUNTER — Ambulatory Visit
Admission: RE | Admit: 2018-01-07 | Discharge: 2018-01-07 | Disposition: A | Discharge status: Home / Self Care | Payer: Medicare HMO | Source: Ambulatory Visit | Attending: Radiation Oncology | Admitting: Radiation Oncology

## 2018-01-07 DIAGNOSIS — I700 Atherosclerosis of aorta: Secondary | ICD-10-CM | POA: Diagnosis not present

## 2018-01-07 DIAGNOSIS — Z8051 Family history of malignant neoplasm of kidney: Secondary | ICD-10-CM | POA: Diagnosis not present

## 2018-01-07 DIAGNOSIS — C20 Malignant neoplasm of rectum: Secondary | ICD-10-CM | POA: Diagnosis not present

## 2018-01-07 DIAGNOSIS — R69 Illness, unspecified: Secondary | ICD-10-CM | POA: Diagnosis not present

## 2018-01-07 DIAGNOSIS — Z510 Encounter for antineoplastic radiation therapy: Secondary | ICD-10-CM | POA: Diagnosis not present

## 2018-01-07 DIAGNOSIS — R634 Abnormal weight loss: Secondary | ICD-10-CM | POA: Diagnosis not present

## 2018-01-07 DIAGNOSIS — J432 Centrilobular emphysema: Secondary | ICD-10-CM | POA: Diagnosis not present

## 2018-01-07 DIAGNOSIS — Z79899 Other long term (current) drug therapy: Secondary | ICD-10-CM | POA: Diagnosis not present

## 2018-01-07 DIAGNOSIS — Z9049 Acquired absence of other specified parts of digestive tract: Secondary | ICD-10-CM | POA: Diagnosis not present

## 2018-01-07 DIAGNOSIS — I2510 Atherosclerotic heart disease of native coronary artery without angina pectoris: Secondary | ICD-10-CM | POA: Diagnosis not present

## 2018-01-08 ENCOUNTER — Ambulatory Visit
Admission: RE | Admit: 2018-01-08 | Discharge: 2018-01-08 | Disposition: A | Discharge status: Home / Self Care | Payer: Medicare HMO | Source: Ambulatory Visit | Attending: Radiation Oncology | Admitting: Radiation Oncology

## 2018-01-08 DIAGNOSIS — Z8051 Family history of malignant neoplasm of kidney: Secondary | ICD-10-CM | POA: Diagnosis not present

## 2018-01-08 DIAGNOSIS — R69 Illness, unspecified: Secondary | ICD-10-CM | POA: Diagnosis not present

## 2018-01-08 DIAGNOSIS — I2510 Atherosclerotic heart disease of native coronary artery without angina pectoris: Secondary | ICD-10-CM | POA: Diagnosis not present

## 2018-01-08 DIAGNOSIS — Z510 Encounter for antineoplastic radiation therapy: Secondary | ICD-10-CM | POA: Diagnosis not present

## 2018-01-08 DIAGNOSIS — Z9049 Acquired absence of other specified parts of digestive tract: Secondary | ICD-10-CM | POA: Diagnosis not present

## 2018-01-08 DIAGNOSIS — C20 Malignant neoplasm of rectum: Secondary | ICD-10-CM | POA: Diagnosis not present

## 2018-01-08 DIAGNOSIS — R634 Abnormal weight loss: Secondary | ICD-10-CM | POA: Diagnosis not present

## 2018-01-08 DIAGNOSIS — J432 Centrilobular emphysema: Secondary | ICD-10-CM | POA: Diagnosis not present

## 2018-01-08 DIAGNOSIS — I700 Atherosclerosis of aorta: Secondary | ICD-10-CM | POA: Diagnosis not present

## 2018-01-08 DIAGNOSIS — Z79899 Other long term (current) drug therapy: Secondary | ICD-10-CM | POA: Diagnosis not present

## 2018-01-09 ENCOUNTER — Ambulatory Visit
Admission: RE | Admit: 2018-01-09 | Discharge: 2018-01-09 | Disposition: A | Discharge status: Home / Self Care | Payer: Medicare HMO | Source: Ambulatory Visit | Attending: Radiation Oncology | Admitting: Radiation Oncology

## 2018-01-09 ENCOUNTER — Inpatient Hospital Stay: Payer: Medicare HMO

## 2018-01-09 ENCOUNTER — Encounter: Payer: Self-pay | Admitting: Nurse Practitioner

## 2018-01-09 ENCOUNTER — Inpatient Hospital Stay (HOSPITAL_BASED_OUTPATIENT_CLINIC_OR_DEPARTMENT_OTHER): Payer: Medicare HMO | Admitting: Nurse Practitioner

## 2018-01-09 VITALS — BP 115/60 | HR 79 | Temp 97.7°F | Resp 24 | Ht 66.0 in | Wt 139.0 lb

## 2018-01-09 DIAGNOSIS — R69 Illness, unspecified: Secondary | ICD-10-CM | POA: Diagnosis not present

## 2018-01-09 DIAGNOSIS — R634 Abnormal weight loss: Secondary | ICD-10-CM | POA: Diagnosis not present

## 2018-01-09 DIAGNOSIS — Z510 Encounter for antineoplastic radiation therapy: Secondary | ICD-10-CM | POA: Diagnosis not present

## 2018-01-09 DIAGNOSIS — Z8051 Family history of malignant neoplasm of kidney: Secondary | ICD-10-CM | POA: Diagnosis not present

## 2018-01-09 DIAGNOSIS — R197 Diarrhea, unspecified: Secondary | ICD-10-CM | POA: Diagnosis not present

## 2018-01-09 DIAGNOSIS — I2510 Atherosclerotic heart disease of native coronary artery without angina pectoris: Secondary | ICD-10-CM | POA: Diagnosis not present

## 2018-01-09 DIAGNOSIS — D696 Thrombocytopenia, unspecified: Secondary | ICD-10-CM | POA: Diagnosis not present

## 2018-01-09 DIAGNOSIS — R110 Nausea: Secondary | ICD-10-CM

## 2018-01-09 DIAGNOSIS — I700 Atherosclerosis of aorta: Secondary | ICD-10-CM | POA: Diagnosis not present

## 2018-01-09 DIAGNOSIS — K6289 Other specified diseases of anus and rectum: Secondary | ICD-10-CM

## 2018-01-09 DIAGNOSIS — Z9049 Acquired absence of other specified parts of digestive tract: Secondary | ICD-10-CM | POA: Diagnosis not present

## 2018-01-09 DIAGNOSIS — Z79899 Other long term (current) drug therapy: Secondary | ICD-10-CM | POA: Diagnosis not present

## 2018-01-09 DIAGNOSIS — R42 Dizziness and giddiness: Secondary | ICD-10-CM

## 2018-01-09 DIAGNOSIS — C20 Malignant neoplasm of rectum: Secondary | ICD-10-CM | POA: Diagnosis not present

## 2018-01-09 DIAGNOSIS — J432 Centrilobular emphysema: Secondary | ICD-10-CM | POA: Diagnosis not present

## 2018-01-09 LAB — COMPREHENSIVE METABOLIC PANEL
ALT: 12 U/L (ref 0–55)
AST: 17 U/L (ref 5–34)
Albumin: 3.6 g/dL (ref 3.5–5.0)
Alkaline Phosphatase: 66 U/L (ref 40–150)
Anion gap: 7 (ref 3–11)
BUN: 13 mg/dL (ref 7–26)
CHLORIDE: 106 mmol/L (ref 98–109)
CO2: 26 mmol/L (ref 22–29)
CREATININE: 0.87 mg/dL (ref 0.70–1.30)
Calcium: 8.7 mg/dL (ref 8.4–10.4)
GFR calc Af Amer: >60 mL/min (ref >60)
GLUCOSE: 135 mg/dL (ref 70–140)
Potassium: 4 mmol/L (ref 3.5–5.1)
Sodium: 139 mmol/L (ref 136–145)
Total Bilirubin: 0.5 mg/dL (ref 0.2–1.2)
Total Protein: 6.5 g/dL (ref 6.4–8.3)

## 2018-01-09 LAB — CBC WITH DIFFERENTIAL/PLATELET
Basophils Absolute: 0 10*3/uL (ref 0.0–0.1)
Basophils Relative: 1 %
EOS ABS: 0.4 10*3/uL (ref 0.0–0.5)
Eosinophils Relative: 7 %
HCT: 43.4 % (ref 38.4–49.9)
Hemoglobin: 14.7 g/dL (ref 13.0–17.1)
LYMPHS ABS: 0.8 10*3/uL — AB (ref 0.9–3.3)
Lymphocytes Relative: 14 %
MCH: 30.8 pg (ref 27.2–33.4)
MCHC: 33.9 g/dL (ref 32.0–36.0)
MCV: 91 fL (ref 79.3–98.0)
MONO ABS: 0.6 10*3/uL (ref 0.1–0.9)
Monocytes Relative: 11 %
Neutro Abs: 3.9 10*3/uL (ref 1.5–6.5)
Neutrophils Relative %: 67 %
Platelets: 134 10*3/uL — ABNORMAL LOW (ref 140–400)
RBC: 4.77 MIL/uL (ref 4.20–5.82)
RDW: 15.3 % — ABNORMAL HIGH (ref 11.0–14.6)
WBC: 5.7 10*3/uL (ref 4.0–10.3)

## 2018-01-09 MED ORDER — ONDANSETRON HCL 8 MG PO TABS
8.0000 mg | ORAL_TABLET | Freq: Once | ORAL | Status: AC
  Administered 2018-01-09: 8 mg via ORAL

## 2018-01-09 MED ORDER — ONDANSETRON HCL 8 MG PO TABS
ORAL_TABLET | ORAL | Status: AC
  Filled 2018-01-09: qty 1

## 2018-01-09 MED ORDER — ONDANSETRON HCL 8 MG PO TABS: 8.0000 mg | ORAL_TABLET | Freq: Three times a day (TID) | ORAL | 1 refills | Status: DC | PRN

## 2018-01-09 NOTE — Progress Notes (Signed)
[place]  Telephone:([phone]) [phone] Fax:(336) [phone]  Clinic Follow up Note   Patient Care Team: Jearld Fenton, NP as PCP - General (Internal Medicine) Carol Ada, MD as Consulting Physician (Gastroenterology) Truitt Merle, MD as Consulting Physician (Hematology) Alla Feeling, NP as Nurse Practitioner (Nurse Practitioner) 01/09/2018  CHIEF COMPLAINT: Follow up rectal cancer   SUMMARY OF ONCOLOGIC HISTORY: Oncology History   Cancer Staging Rectal adenocarcinoma [facility]) Staging form: Colon and Rectum, AJCC 8th Edition - Clinical stage from 10/31/2017: Stage IIA (cT3, cN0, cM0) - Signed by Truitt Merle, MD on 11/28/2017       Rectal adenocarcinoma ([place])   10/21/2017 Procedure    COLONOSCOPY per Dr. Carol Ada Findings: A fungating, infiltrative and ulcerated non--obstructing large mass was found in the rectum.  The mass was circumferential.  The mass measured 3 cm in length.  Oozing was present.  This was biopsied with a cold snare for histology.  2 sessile polyps were found in the transverse colon and ascending colon.  The polyps were 3-4 mm in size.  The polyps were removed with a cold snare.  Section and retrieval were complete.  A 15 mm polyp was found in the sigmoid colon.  The polyp was semi-pedunculated.  The polyp was removed with a hot snare.  Resection and retrieval were complete  The mass was extremely friable and palpable with a rectal examination.  The distal extent was 5 cm from the dentate line and it was 3 cm in length.  Specimens were obtained using a cold snare.  Retroflexion was not possible with the close proximity of the lesion in the rectum.         10/21/2017 Initial Biopsy    Final microscopic diagnosis A.  Colon, ascending, polyp: Tubular adenoma No high-grade dysplasia or malignancy  B.  Colon, transverse, polyp: Tubular adenoma No high-grade dysplasia or malignancy  C.  Colon, sigmoid, polyp: Tubular adenoma with  high-grade dysplasia and mucosal prolapse  D.  Rectum, mass, biopsy: Invasive adenocarcinoma, moderately differentiated Immunohistochemical stains for ML H1, [place] 2, [place] 6 and PMS 2 are intact (normal)  There is no evidence of DNA mismatch repair deficiency in the carcinoma, indicating that the tumor is most likely microsatellite stable, and that Lynch syndrome (a common cause of hereditary non-polyposis colorectal cancer or HNPCC) is very unlikely      10/21/2017 Imaging    CT A/P W CONTRAST IMPRESSION: 1. Irregular mural and mucosal thickening of the rectum with small right 9 mm short axis index lymph node adjacent to the rectum. 2. Diffuse mild to moderate fluid-filled distention of small bowel loops query ileus or dysmotility. No mechanical source obstruction is seen. 3. Submucosal fatty infiltration of the descending and sigmoid colon possibly representing stigmata of chronic inflammatory bowel disease.       10/21/2017 Initial Diagnosis    Rectal adenocarcinoma ([place])      10/31/2017 Procedure    EUS: Findings: A hypoechoic mass was found in the rectum. The mass was encountered at 5 cm (from the anal verge). The mass was partially circumferential (involving 50% of the lumen). The endosonographic borders were poorly-defined and irregular. The mass measured 30 mm (in maximum length) by 13 mm (in maximum thickness). There was sonographic evidence suggesting breakthrough of the muscularis propria with invasion into the perirectal fat (uT3, manifested by prominent pseudopodia). There was no sonographic evidence of invasion into the adjacent structures. Area was tattooed with an injection of 3 mL of  Spot (carbon black) both proximally and distally. I was not able to identify any significant lymph nodes. - Rectal mass was visualized endosonographically. A tissue diagnosis was obtained prior to this exam. This is of adenocarcinoma. This was staged uT3 uN0. Tattooed. - No specimens  collected.      11/21/2017 Imaging    CT Chest 11/21/17  IMPRESSION: 1. No evidence of thoracic metastatic disease. 2. Moderate centrilobular emphysema. 3. Mild coronary artery and Aortic Atherosclerosis (ICD10-I70.0).      12/15/2017 -  Chemotherapy    concurrent chemoradiation with Xeloda 3 tabs BID       12/16/2017 -  Radiation Therapy     concurrent chemoradiation at [facility]: Started 12/15/17 concurrent radiation and Xeloda '1500mg'$  BID on days of radiation, changed to 1500 mg in the morning and 1000 mg in the evening on 12/29/17 due to fatigue   INTERVAL HISTORY: [name] returns for follow-up as scheduled while on chemoradiation with Xeloda.  Take 3 tablets morning and 2 tablets in the evening on days with radiation.  He feels his fatigue has improved with dose reduction.  He has frequent nausea mostly in the morning, improves with Zofran and occasional lightheadedness in the morning upon waking up.  He denies fall.  He has not eaten yet today prior to this morning appointment but reports good appetite, no constipation or diarrhea.  No mucositis.  He feels like he is "sitting on a rock" and has rectal pain when sitting on hard surfaces. Has not taken medication. 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  Denies rectal discharge or bleeding.  No redness or sensitivity to hands or feet.  REVIEW OF SYSTEMS:   Constitutional: Denies fevers, chills or abnormal weight loss (+) fatigue, improved on reduced Xeloda dose Eyes: Denies blurriness of vision Ears, nose, mouth, throat, and face: Denies mucositis or sore throat Respiratory: Denies cough, dyspnea or wheezes Cardiovascular: Denies palpitation, chest discomfort or lower extremity swelling Gastrointestinal:  Denies constipation, diarrhea, vomiting heartburn or change in bowel habits.  Denies rectal discharge or bleeding (+) frequent daily nausea, worse in a.m; managed with  Zofran (+) rectal pain with sitting on hard surface Skin: Denies abnormal skin rashes, redness, or sensitivity to hands/feet Lymphatics: Denies new lymphadenopathy or easy bruising Neurological:Denies numbness, tingling or new weaknesses (+) occasional lightheadedness upon waking  Behavioral/Psych: Mood is stable, no new changes  All other systems were reviewed with the patient and are negative.  MEDICAL HISTORY:  Past Medical History:  Diagnosis Date  . Cancer ([place]) 10/21/2017   rectal cancer  . Rectal adenocarcinoma ([place]) 11/07/2017    SURGICAL HISTORY: Past Surgical History:  Procedure Laterality Date  . CHOLECYSTECTOMY    . EUS N/A 10/31/2017   Procedure: LOWER ENDOSCOPIC ULTRASOUND (EUS);  Surgeon: Carol Ada, MD;  Location: [facility];  Service: Endoscopy;  Laterality: N/A;    I have reviewed the social history and family history with the patient and they are unchanged from previous note.  ALLERGIES:  has No Known Allergies.  MEDICATIONS:  Current Outpatient Medications  Medication Sig Dispense Refill  . capecitabine (XELODA) 500 MG tablet Take 3 tablets (1,500 mg total) by mouth 2 (two) times daily after a meal. Take Monday through Friday when on radiation treatment. 45 tablet 0  . diphenoxylate-atropine (LOMOTIL) 2.5-0.025 MG tablet Take 1 tablet by mouth 4 (four) times daily as needed for diarrhea or loose stools.  30 tablet 0  . ondansetron (ZOFRAN) 8 MG tablet Take 1 tablet (8 mg total) by mouth every 8 (eight) hours as needed for nausea or vomiting. 30 tablet 1   No current facility-administered medications for this visit.     PHYSICAL EXAMINATION: ECOG PERFORMANCE STATUS: 1 - Symptomatic but completely ambulatory  Vitals:   01/09/18 1025  BP: 115/60  Pulse: 79  Resp: (!) 24  Temp: 97.7 F (36.5 C)  SpO2: 98%   Filed Weights   01/09/18 1025  Weight: 139 lb (63 kg)    GENERAL:alert, no distress and comfortable SKIN: skin color, texture, turgor are  normal, no rashes or significant lesions.  No palmar-plantar erythema EYES: normal, Conjunctiva are pink and non-injected, sclera clear OROPHARYNX:no exudate, no erythema and lips, buccal mucosa, and tongue normal  NECK: supple, thyroid normal size, non-tender, without nodularity LYMPH:  no palpable cervical, supraclavicular, axillary, or inguinal lymphadenopathy LUNGS: clear to auscultation bilaterally with normal breathing effort HEART: regular rate & rhythm and no murmurs and no lower extremity edema ABDOMEN:abdomen soft, non-tender and normal bowel sounds.   Musculoskeletal:no cyanosis of digits and no clubbing  NEURO: alert & oriented x 3 with fluent speech, no focal motor/sensory deficits RECTAL: external exam reveals mild perineal erythema; moist desquamation noted to gluteal cleft.    LABORATORY DATA:  I have reviewed the data as listed CBC Latest Ref Rng & Units 01/09/2018 01/05/2018 12/29/2017  WBC 4.0 - 10.3 K/uL 5.7 7.6 7.8  Hemoglobin 13.0 - 17.1 g/dL 14.7 14.4 15.2  Hematocrit 38.4 - 49.9 % 43.4 42.1 45.6  Platelets 140 - 400 K/uL 134(L) 147 197     CMP Latest Ref Rng & Units 01/09/2018 01/05/2018 12/29/2017  Glucose 70 - 140 mg/dL 135 103 112  BUN 7 - 26 mg/dL '13 14 10  '$ Creatinine 0.70 - 1.30 mg/dL 0.87 0.88 0.86  Sodium 136 - 145 mmol/L 139 139 140  Potassium 3.5 - 5.1 mmol/L 4.0 3.6 3.7  Chloride 98 - 109 mmol/L 106 104 105  CO2 22 - 29 mmol/L '26 27 26  '$ Calcium 8.4 - 10.4 mg/dL 8.7 8.8 9.0  Total Protein 6.4 - 8.3 g/dL 6.5 6.7 7.0  Total Bilirubin 0.2 - 1.2 mg/dL 0.5 0.5 0.3  Alkaline Phos 40 - 150 U/L 66 66 72  AST 5 - 34 U/L '17 18 16  '$ ALT 0 - 55 U/L '12 13 14      '$ RADIOGRAPHIC STUDIES: I have personally reviewed the radiological images as listed and agreed with the findings in the report. No results found.   ASSESSMENT & PLAN: Chi Garlow is a 72 y.o. caucasian male with history of anxiety, depression, tobacco use presenting with painless rectal bleeding,  abdominal pain, nausea, diarrhea, fatigue, and 25 lbs weight loss over 1 year.   1. Rectal adenocarcinoma, uT3uN0M0, stag IIA -Mr. Graffam appears stable today. His fatigue has improved with reduced PM dose. VS and weight stable. Labs reviewed, he has mild thrombocytopenia secondary to chemoRT but CBC and CMP otherwise normal. He will continue current chemoRT with Xeloda 1500 MG AM and 1000 mg PM on days with radiation. Will follow weekly while on chemoRT.  2. Nausea, diarrhea, weight loss -He has AM nausea, managed with zofran PRN. He is nauseated in clinic today and he forgot his medication. Will give him a dose today in our office, refilled Rx for him.  -He currently denies diarrhea but we discussed symptom management with imodium PRN.  3. Dizziness  -He  has occasional dizziness upon waking up. Could be related to mild dehydration. Possible medication-related. CMP is normal. I encouraged him to eat regularly and maintain adequate hydration. his BP is slightly low for him in clinic today, 115/60. I suggested IVF support with anti-emetics today but he declined.  He agrees to increase po intake and oral hydration.  4. Rectal pain -He has radiation changes to perineum. His pain increases when he sits on hard surfaces. I explained his pain may worsen and might require strong pain medication. He declines pain meds today. 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 I recommend he inquire about topical skin care when he goes to rad onc today.   PLAN -Continue chemoRT with Xeloda 1500 mg AM/1000 mg PM on days with radiation -Zofran 8 mg once today in clinic, refilled prescription -Increase po intake, liquids -Lab and f/u weekly while on chemoradiation  All questions were answered. The patient knows to call the clinic with any problems, questions or concerns. No barriers to learning was detected.    Alla Feeling, NP 01/09/18

## 2018-01-12 ENCOUNTER — Ambulatory Visit
Admission: RE | Admit: 2018-01-12 | Discharge: 2018-01-12 | Disposition: A | Discharge status: Home / Self Care | Payer: Medicare HMO | Source: Ambulatory Visit | Attending: Radiation Oncology | Admitting: Radiation Oncology

## 2018-01-12 ENCOUNTER — Other Ambulatory Visit: Payer: Medicare HMO

## 2018-01-12 ENCOUNTER — Ambulatory Visit: Payer: Medicare HMO | Admitting: Hematology

## 2018-01-12 ENCOUNTER — Other Ambulatory Visit: Payer: Self-pay

## 2018-01-12 DIAGNOSIS — Z79899 Other long term (current) drug therapy: Secondary | ICD-10-CM | POA: Diagnosis not present

## 2018-01-12 DIAGNOSIS — Z9049 Acquired absence of other specified parts of digestive tract: Secondary | ICD-10-CM | POA: Diagnosis not present

## 2018-01-12 DIAGNOSIS — C20 Malignant neoplasm of rectum: Secondary | ICD-10-CM | POA: Diagnosis not present

## 2018-01-12 DIAGNOSIS — R634 Abnormal weight loss: Secondary | ICD-10-CM | POA: Diagnosis not present

## 2018-01-12 DIAGNOSIS — I700 Atherosclerosis of aorta: Secondary | ICD-10-CM | POA: Diagnosis not present

## 2018-01-12 DIAGNOSIS — J432 Centrilobular emphysema: Secondary | ICD-10-CM | POA: Diagnosis not present

## 2018-01-12 DIAGNOSIS — Z510 Encounter for antineoplastic radiation therapy: Secondary | ICD-10-CM | POA: Diagnosis not present

## 2018-01-12 DIAGNOSIS — R69 Illness, unspecified: Secondary | ICD-10-CM | POA: Diagnosis not present

## 2018-01-12 DIAGNOSIS — I2510 Atherosclerotic heart disease of native coronary artery without angina pectoris: Secondary | ICD-10-CM | POA: Diagnosis not present

## 2018-01-12 DIAGNOSIS — Z8051 Family history of malignant neoplasm of kidney: Secondary | ICD-10-CM | POA: Diagnosis not present

## 2018-01-13 ENCOUNTER — Ambulatory Visit
Admission: RE | Admit: 2018-01-13 | Discharge: 2018-01-13 | Disposition: A | Discharge status: Home / Self Care | Payer: Medicare HMO | Source: Ambulatory Visit | Attending: Radiation Oncology | Admitting: Radiation Oncology

## 2018-01-13 DIAGNOSIS — Z8051 Family history of malignant neoplasm of kidney: Secondary | ICD-10-CM | POA: Diagnosis not present

## 2018-01-13 DIAGNOSIS — Z9049 Acquired absence of other specified parts of digestive tract: Secondary | ICD-10-CM | POA: Diagnosis not present

## 2018-01-13 DIAGNOSIS — Z510 Encounter for antineoplastic radiation therapy: Secondary | ICD-10-CM | POA: Diagnosis not present

## 2018-01-13 DIAGNOSIS — Z79899 Other long term (current) drug therapy: Secondary | ICD-10-CM | POA: Diagnosis not present

## 2018-01-13 DIAGNOSIS — I700 Atherosclerosis of aorta: Secondary | ICD-10-CM | POA: Diagnosis not present

## 2018-01-13 DIAGNOSIS — I2510 Atherosclerotic heart disease of native coronary artery without angina pectoris: Secondary | ICD-10-CM | POA: Diagnosis not present

## 2018-01-13 DIAGNOSIS — R69 Illness, unspecified: Secondary | ICD-10-CM | POA: Diagnosis not present

## 2018-01-13 DIAGNOSIS — C20 Malignant neoplasm of rectum: Secondary | ICD-10-CM | POA: Diagnosis not present

## 2018-01-13 DIAGNOSIS — R634 Abnormal weight loss: Secondary | ICD-10-CM | POA: Diagnosis not present

## 2018-01-13 DIAGNOSIS — J432 Centrilobular emphysema: Secondary | ICD-10-CM | POA: Diagnosis not present

## 2018-01-14 ENCOUNTER — Ambulatory Visit
Admission: RE | Admit: 2018-01-14 | Discharge: 2018-01-14 | Disposition: A | Discharge status: Home / Self Care | Payer: Medicare HMO | Source: Ambulatory Visit | Attending: Radiation Oncology | Admitting: Radiation Oncology

## 2018-01-14 ENCOUNTER — Telehealth: Payer: Self-pay

## 2018-01-14 DIAGNOSIS — Z8051 Family history of malignant neoplasm of kidney: Secondary | ICD-10-CM | POA: Diagnosis not present

## 2018-01-14 DIAGNOSIS — J432 Centrilobular emphysema: Secondary | ICD-10-CM | POA: Diagnosis not present

## 2018-01-14 DIAGNOSIS — I700 Atherosclerosis of aorta: Secondary | ICD-10-CM | POA: Diagnosis not present

## 2018-01-14 DIAGNOSIS — C20 Malignant neoplasm of rectum: Secondary | ICD-10-CM | POA: Diagnosis not present

## 2018-01-14 DIAGNOSIS — Z79899 Other long term (current) drug therapy: Secondary | ICD-10-CM | POA: Diagnosis not present

## 2018-01-14 DIAGNOSIS — Z9049 Acquired absence of other specified parts of digestive tract: Secondary | ICD-10-CM | POA: Diagnosis not present

## 2018-01-14 DIAGNOSIS — Z510 Encounter for antineoplastic radiation therapy: Secondary | ICD-10-CM | POA: Diagnosis not present

## 2018-01-14 DIAGNOSIS — I2510 Atherosclerotic heart disease of native coronary artery without angina pectoris: Secondary | ICD-10-CM | POA: Diagnosis not present

## 2018-01-14 DIAGNOSIS — R69 Illness, unspecified: Secondary | ICD-10-CM | POA: Diagnosis not present

## 2018-01-14 DIAGNOSIS — R634 Abnormal weight loss: Secondary | ICD-10-CM | POA: Diagnosis not present

## 2018-01-14 NOTE — Telephone Encounter (Signed)
Called and verified new appointments added for 2/15 per provider inbasket. Per 1/28 inbasket

## 2018-01-15 ENCOUNTER — Ambulatory Visit
Admission: RE | Admit: 2018-01-15 | Discharge: 2018-01-15 | Disposition: A | Discharge status: Home / Self Care | Payer: Medicare HMO | Source: Ambulatory Visit | Attending: Radiation Oncology | Admitting: Radiation Oncology

## 2018-01-15 DIAGNOSIS — Z79899 Other long term (current) drug therapy: Secondary | ICD-10-CM | POA: Diagnosis not present

## 2018-01-15 DIAGNOSIS — Z8051 Family history of malignant neoplasm of kidney: Secondary | ICD-10-CM | POA: Diagnosis not present

## 2018-01-15 DIAGNOSIS — I2510 Atherosclerotic heart disease of native coronary artery without angina pectoris: Secondary | ICD-10-CM | POA: Diagnosis not present

## 2018-01-15 DIAGNOSIS — Z9049 Acquired absence of other specified parts of digestive tract: Secondary | ICD-10-CM | POA: Diagnosis not present

## 2018-01-15 DIAGNOSIS — R69 Illness, unspecified: Secondary | ICD-10-CM | POA: Diagnosis not present

## 2018-01-15 DIAGNOSIS — C20 Malignant neoplasm of rectum: Secondary | ICD-10-CM | POA: Diagnosis not present

## 2018-01-15 DIAGNOSIS — I700 Atherosclerosis of aorta: Secondary | ICD-10-CM | POA: Diagnosis not present

## 2018-01-15 DIAGNOSIS — J432 Centrilobular emphysema: Secondary | ICD-10-CM | POA: Diagnosis not present

## 2018-01-15 DIAGNOSIS — Z510 Encounter for antineoplastic radiation therapy: Secondary | ICD-10-CM | POA: Diagnosis not present

## 2018-01-15 DIAGNOSIS — R634 Abnormal weight loss: Secondary | ICD-10-CM | POA: Diagnosis not present

## 2018-01-16 ENCOUNTER — Ambulatory Visit
Admission: RE | Admit: 2018-01-16 | Discharge: 2018-01-16 | Disposition: A | Discharge status: Home / Self Care | Payer: Medicare HMO | Source: Ambulatory Visit | Attending: Radiation Oncology | Admitting: Radiation Oncology

## 2018-01-16 ENCOUNTER — Telehealth: Payer: Self-pay | Admitting: Oncology

## 2018-01-16 ENCOUNTER — Inpatient Hospital Stay (HOSPITAL_BASED_OUTPATIENT_CLINIC_OR_DEPARTMENT_OTHER): Payer: Medicare HMO | Admitting: Oncology

## 2018-01-16 ENCOUNTER — Encounter: Payer: Self-pay | Admitting: Oncology

## 2018-01-16 ENCOUNTER — Inpatient Hospital Stay: Payer: Medicare HMO

## 2018-01-16 VITALS — BP 126/65 | HR 94 | Temp 99.1°F | Resp 20 | Ht 66.0 in | Wt 140.6 lb

## 2018-01-16 DIAGNOSIS — C20 Malignant neoplasm of rectum: Secondary | ICD-10-CM | POA: Diagnosis not present

## 2018-01-16 DIAGNOSIS — K6289 Other specified diseases of anus and rectum: Secondary | ICD-10-CM

## 2018-01-16 DIAGNOSIS — R634 Abnormal weight loss: Secondary | ICD-10-CM

## 2018-01-16 DIAGNOSIS — I2510 Atherosclerotic heart disease of native coronary artery without angina pectoris: Secondary | ICD-10-CM | POA: Diagnosis not present

## 2018-01-16 DIAGNOSIS — I700 Atherosclerosis of aorta: Secondary | ICD-10-CM | POA: Diagnosis not present

## 2018-01-16 DIAGNOSIS — F17210 Nicotine dependence, cigarettes, uncomplicated: Secondary | ICD-10-CM | POA: Diagnosis not present

## 2018-01-16 DIAGNOSIS — R197 Diarrhea, unspecified: Secondary | ICD-10-CM | POA: Diagnosis not present

## 2018-01-16 DIAGNOSIS — Z79899 Other long term (current) drug therapy: Secondary | ICD-10-CM

## 2018-01-16 DIAGNOSIS — J432 Centrilobular emphysema: Secondary | ICD-10-CM | POA: Diagnosis not present

## 2018-01-16 DIAGNOSIS — Z8051 Family history of malignant neoplasm of kidney: Secondary | ICD-10-CM | POA: Diagnosis not present

## 2018-01-16 DIAGNOSIS — R110 Nausea: Secondary | ICD-10-CM

## 2018-01-16 DIAGNOSIS — Z9049 Acquired absence of other specified parts of digestive tract: Secondary | ICD-10-CM | POA: Diagnosis not present

## 2018-01-16 DIAGNOSIS — Z720 Tobacco use: Secondary | ICD-10-CM

## 2018-01-16 DIAGNOSIS — Z5111 Encounter for antineoplastic chemotherapy: Secondary | ICD-10-CM

## 2018-01-16 DIAGNOSIS — D696 Thrombocytopenia, unspecified: Secondary | ICD-10-CM | POA: Diagnosis not present

## 2018-01-16 DIAGNOSIS — R42 Dizziness and giddiness: Secondary | ICD-10-CM | POA: Diagnosis not present

## 2018-01-16 DIAGNOSIS — R69 Illness, unspecified: Secondary | ICD-10-CM | POA: Diagnosis not present

## 2018-01-16 DIAGNOSIS — Z510 Encounter for antineoplastic radiation therapy: Secondary | ICD-10-CM | POA: Diagnosis not present

## 2018-01-16 LAB — CBC WITH DIFFERENTIAL/PLATELET
Basophils Absolute: 0 10*3/uL (ref 0.0–0.1)
Basophils Relative: 0 %
EOS PCT: 6 %
Eosinophils Absolute: 0.4 10*3/uL (ref 0.0–0.5)
HCT: 42.6 % (ref 38.4–49.9)
Hemoglobin: 14.7 g/dL (ref 13.0–17.1)
LYMPHS ABS: 0.7 10*3/uL — AB (ref 0.9–3.3)
LYMPHS PCT: 10 %
MCH: 31.3 pg (ref 27.2–33.4)
MCHC: 34.4 g/dL (ref 32.0–36.0)
MCV: 91 fL (ref 79.3–98.0)
MONOS PCT: 13 %
Monocytes Absolute: 0.9 10*3/uL (ref 0.1–0.9)
Neutro Abs: 4.7 10*3/uL (ref 1.5–6.5)
Neutrophils Relative %: 71 %
PLATELETS: 171 10*3/uL (ref 140–400)
RBC: 4.68 MIL/uL (ref 4.20–5.82)
RDW: 15.2 % — ABNORMAL HIGH (ref 11.0–14.6)
WBC: 6.6 10*3/uL (ref 4.0–10.3)

## 2018-01-16 LAB — COMPREHENSIVE METABOLIC PANEL
ALT: 12 U/L (ref 0–55)
AST: 18 U/L (ref 5–34)
Albumin: 3.9 g/dL (ref 3.5–5.0)
Alkaline Phosphatase: 68 U/L (ref 40–150)
Anion gap: 11 (ref 3–11)
BUN: 9 mg/dL (ref 7–26)
CALCIUM: 9.3 mg/dL (ref 8.4–10.4)
CHLORIDE: 102 mmol/L (ref 98–109)
CO2: 24 mmol/L (ref 22–29)
CREATININE: 1.03 mg/dL (ref 0.70–1.30)
GFR calc non Af Amer: >60 mL/min (ref >60)
Glucose, Bld: 132 mg/dL (ref 70–140)
Potassium: 3.5 mmol/L (ref 3.5–5.1)
Sodium: 137 mmol/L (ref 136–145)
Total Bilirubin: 0.8 mg/dL (ref 0.2–1.2)
Total Protein: 7 g/dL (ref 6.4–8.3)

## 2018-01-16 MED ORDER — NICOTINE 14 MG/24HR TD PT24: 14.0000 mg | MEDICATED_PATCH | Freq: Every day | TRANSDERMAL | 0 refills | Status: DC

## 2018-01-16 MED ORDER — DIPHENOXYLATE-ATROPINE 2.5-0.025 MG PO TABS: 1.0000 | ORAL_TABLET | Freq: Four times a day (QID) | ORAL | 0 refills | Status: DC | PRN

## 2018-01-16 MED ORDER — TRAMADOL HCL 50 MG PO TABS: 50.0000 mg | ORAL_TABLET | Freq: Four times a day (QID) | ORAL | 0 refills | Status: DC | PRN

## 2018-01-16 NOTE — Progress Notes (Signed)
[facility] OFFICE PROGRESS NOTE  Jearld Fenton, NP [place] [address]  DIAGNOSIS: Rectal adenocarcinoma, uT3uN0M0, stag IIA  PRIOR THERAPY: None  CURRENT THERAPY: Concurrent chemoradiation therapy with Xeloda 1500 mg in the morning and 1000 mg in the evening on the days of radiation.  INTERVAL HISTORY: Wesley [name] 72 y.o. male returns for routine follow-up visit accompanied by his daughter.  The patient continues on concurrent chemoradiation.  He is reporting more fatigue and more pain to his rectum.  He reports as though it feels as though he is sitting on a rock.  He is not currently on any pain medications and does not take over-the-counter Tylenol or NSAIDs.  He reports having diarrhea 2-3 times per day.  He takes Lomotil only 1 tablet a day.  He has not tried Imodium.  The patient denies fevers and chills.  Denies chest pain, shortness of breath, cough, hemoptysis.  He reports intermittent nausea without vomiting.  He uses Zofran which is effective.  Denies rectal discharge or bleeding.  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  Denies redness or sensitivity to his hands or feet.  States that he is interested in quitting smoking.  The patient is here for evaluation and repeat lab work.  MEDICAL HISTORY: Past Medical History:  Diagnosis Date  . Cancer ([place]) 10/21/2017   rectal cancer  . Rectal adenocarcinoma ([place]) 11/07/2017    ALLERGIES:  has No Known Allergies.  MEDICATIONS:  Current Outpatient Medications  Medication Sig Dispense Refill  . capecitabine (XELODA) 500 MG tablet Take 3 tablets (1,500 mg total) by mouth 2 (two) times daily after a meal. Take Monday through Friday when on radiation treatment. 45 tablet 0  . diphenoxylate-atropine (LOMOTIL) 2.5-0.025 MG tablet Take 1-2 tablets by mouth 4 (four) times daily as needed for diarrhea or loose stools. 40 tablet 0  . ondansetron (ZOFRAN) 8 MG tablet Take 1  tablet (8 mg total) by mouth every 8 (eight) hours as needed for nausea or vomiting. 30 tablet 1  . nicotine (NICODERM CQ) 14 mg/24hr patch Place 1 patch (14 mg total) onto the skin daily. 30 patch 0  . traMADol (ULTRAM) 50 MG tablet Take 1 tablet (50 mg total) by mouth every 6 (six) hours as needed. 30 tablet 0   No current facility-administered medications for this visit.     SURGICAL HISTORY:  Past Surgical History:  Procedure Laterality Date  . CHOLECYSTECTOMY    . EUS N/A 10/31/2017   Procedure: LOWER ENDOSCOPIC ULTRASOUND (EUS);  Surgeon: Carol Ada, MD;  Location: [facility];  Service: Endoscopy;  Laterality: N/A;    REVIEW OF SYSTEMS:   Review of Systems  Constitutional: Negative for appetite change, chills, fever and unexpected weight change. Positive for fatigue. HENT:   Negative for mouth sores, nosebleeds, sore throat and trouble swallowing.   Eyes: Negative for eye problems and icterus.  Respiratory: Negative for cough, hemoptysis, shortness of breath and wheezing.   Cardiovascular: Negative for chest pain and leg swelling.  Gastrointestinal: Negative for abdominal pain, constipation, and vomiting. Positive for diarrhea intermittent nausea.  Positive for rectal pain. Genitourinary: Negative for bladder incontinence, difficulty urinating, dysuria, frequency and hematuria.   Musculoskeletal: Negative for back pain, gait problem, neck pain and neck stiffness.  Skin: Negative for itching and rash.  Neurological: Negative for dizziness, extremity weakness, gait problem, headaches, light-headedness and seizures.  Hematological: Negative for adenopathy. Does not bruise/bleed easily.  Psychiatric/Behavioral: Negative for confusion, depression and sleep disturbance. The patient is not nervous/anxious.     PHYSICAL EXAMINATION:  Blood pressure 126/65, pulse 94, temperature 99.1 F (37.3 C), temperature source Oral, resp. rate 20, height 5\' 6"  (1.676 m), weight 140 lb 9.6 oz  (63.8 kg), SpO2 96 %.  ECOG PERFORMANCE STATUS: 1 - Symptomatic but completely ambulatory  Physical Exam  Constitutional: Oriented to person, place, and time and well-developed, well-nourished, and in no distress. No distress.  HENT:  Head: Normocephalic and atraumatic.  Mouth/Throat: Oropharynx is clear and moist. No oropharyngeal exudate.  Eyes: Conjunctivae are normal. Right eye exhibits no discharge. Left eye exhibits no discharge. No scleral icterus.  Neck: Normal range of motion. Neck supple.  Cardiovascular: Normal rate, regular rhythm, normal heart sounds and intact distal pulses.   Pulmonary/Chest: Effort normal and breath sounds normal. No respiratory distress. No wheezes. No rales.  Abdominal: Soft. Bowel sounds are normal. Exhibits no distension and no mass. There is no tenderness.  Musculoskeletal: Normal range of motion. Exhibits no edema.  Lymphadenopathy:    No cervical adenopathy.  Neurological: Alert and oriented to person, place, and time. Exhibits normal muscle tone. Gait normal. Coordination normal.  Skin: Skin is warm and dry. No rash noted. Not diaphoretic. No erythema. No pallor.  Psychiatric: Mood, memory and judgment normal.  Rectal: External exam reveals mild perineal erythema; moist desquamation noted to the gluteal cleft. Vitals reviewed.  LABORATORY DATA: Lab Results  Component Value Date   WBC 6.6 01/16/2018   HGB 14.7 01/16/2018   HCT 42.6 01/16/2018   MCV 91.0 01/16/2018   PLT 171 01/16/2018      Chemistry      Component Value Date/Time   NA 137 01/16/2018 1310   NA 138 11/21/2017 1111   K 3.5 01/16/2018 1310   K 4.6 11/21/2017 1111   CL 102 01/16/2018 1310   CL 107 11/12/2014 1149   CO2 24 01/16/2018 1310   CO2 25 11/21/2017 1111   BUN 9 01/16/2018 1310   BUN 9.2 11/21/2017 1111   CREATININE 1.03 01/16/2018 1310   CREATININE 1.0 11/21/2017 1111      Component Value Date/Time   CALCIUM 9.3 01/16/2018 1310   CALCIUM 9.4 11/21/2017  1111   ALKPHOS 68 01/16/2018 1310   ALKPHOS 89 11/21/2017 1111   AST 18 01/16/2018 1310   AST 19 11/21/2017 1111   ALT 12 01/16/2018 1310   ALT 17 11/21/2017 1111   BILITOT 0.8 01/16/2018 1310   BILITOT 0.74 11/21/2017 1111       RADIOGRAPHIC STUDIES:  No results found.   ASSESSMENT/PLAN:  Lindsey Hommel is a64 y.o.caucasian male with history of anxiety, depression, tobacco use presenting with painless rectal bleeding, abdominal pain, nausea, diarrhea, fatigue, and 25 lbs weight loss over 1 year.   1. Rectal adenocarcinoma, uT3uN0M0, stag IIA -Mr. Kronenberger appears stable today. His fatigue has improved with reduced PM dose. VS and weight stable. Labs reviewed and are adequate to continue treatment. He will continue current chemoRT with Xeloda 1500 MG AM and 1000 mg PM on days with radiation. Will follow weekly while on chemoRT.  2. Nausea, diarrhea, weight loss -He has AM nausea, managed with zofran PRN.  -Reports having diarrhea about 2-3 times per day.  He is only taking Lomotil 1 tablet daily.  I have reviewed that he may take up to 8 tablets of Lomotil per day.  He may also added Imodium.  Lomotil was refilled today.  3.  Dizziness  -Dizziness has resolved at this time.  Recommend that he continue po intake and oral hydration.  4. Rectal pain -He has radiation changes to perineum. His pain increases when he sits on hard surfaces.  I have offered him a prescription for either tramadol or hydrocodone.  We discussed the side effects of both of these and I have explained that hydrocodone may be more sedating to him.  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   5.  Smoking cessation -Patient has expressed interest in quitting smoking.  He smokes 1 pack/day on average. -Prescription for NicoDerm patches was sent to his pharmacy. -He was given written information about smoking cessation  support groups and the telephone number to the quit line.  PLAN -Continue chemoRT with Xeloda 1500 mg AM/1000 mg PM on days with radiation -Continue Zofran 8 mg as needed for nausea -Continue to increase po intake, liquids -Tramadol 50 mg every 6 hours as needed for pain -Lomotil for diarrhea up to 8 tablets/day.  May add an Imodium as needed. -NicoDerm patches for smoking cessation. -Lab and f/u weekly while on chemoradiation  All questions were answered. The patient knows to call the clinic with any problems, questions or concerns. No barriers to learning was detected.  No orders of the defined types were placed in this encounter.   Mikey Bussing, DNP, AGPCNP-BC, AOCNP 01/16/18

## 2018-01-16 NOTE — Telephone Encounter (Signed)
No los °

## 2018-01-19 ENCOUNTER — Ambulatory Visit: Payer: Medicare HMO

## 2018-01-19 ENCOUNTER — Ambulatory Visit
Admission: RE | Admit: 2018-01-19 | Discharge: 2018-01-19 | Disposition: A | Discharge status: Home / Self Care | Payer: Medicare HMO | Source: Ambulatory Visit | Attending: Radiation Oncology | Admitting: Radiation Oncology

## 2018-01-19 ENCOUNTER — Other Ambulatory Visit: Payer: Medicare HMO

## 2018-01-19 DIAGNOSIS — Z79899 Other long term (current) drug therapy: Secondary | ICD-10-CM | POA: Diagnosis not present

## 2018-01-19 DIAGNOSIS — R634 Abnormal weight loss: Secondary | ICD-10-CM | POA: Diagnosis not present

## 2018-01-19 DIAGNOSIS — Z9049 Acquired absence of other specified parts of digestive tract: Secondary | ICD-10-CM | POA: Diagnosis not present

## 2018-01-19 DIAGNOSIS — J432 Centrilobular emphysema: Secondary | ICD-10-CM | POA: Diagnosis not present

## 2018-01-19 DIAGNOSIS — R69 Illness, unspecified: Secondary | ICD-10-CM | POA: Diagnosis not present

## 2018-01-19 DIAGNOSIS — I700 Atherosclerosis of aorta: Secondary | ICD-10-CM | POA: Diagnosis not present

## 2018-01-19 DIAGNOSIS — Z510 Encounter for antineoplastic radiation therapy: Secondary | ICD-10-CM | POA: Diagnosis not present

## 2018-01-19 DIAGNOSIS — I2510 Atherosclerotic heart disease of native coronary artery without angina pectoris: Secondary | ICD-10-CM | POA: Diagnosis not present

## 2018-01-19 DIAGNOSIS — C20 Malignant neoplasm of rectum: Secondary | ICD-10-CM | POA: Diagnosis not present

## 2018-01-19 DIAGNOSIS — Z8051 Family history of malignant neoplasm of kidney: Secondary | ICD-10-CM | POA: Diagnosis not present

## 2018-01-20 ENCOUNTER — Ambulatory Visit
Admission: RE | Admit: 2018-01-20 | Discharge: 2018-01-20 | Disposition: A | Discharge status: Home / Self Care | Payer: Medicare HMO | Source: Ambulatory Visit | Attending: Radiation Oncology | Admitting: Radiation Oncology

## 2018-01-20 ENCOUNTER — Ambulatory Visit: Payer: Medicare HMO

## 2018-01-20 DIAGNOSIS — R69 Illness, unspecified: Secondary | ICD-10-CM | POA: Diagnosis not present

## 2018-01-20 DIAGNOSIS — Z79899 Other long term (current) drug therapy: Secondary | ICD-10-CM | POA: Diagnosis not present

## 2018-01-20 DIAGNOSIS — C20 Malignant neoplasm of rectum: Secondary | ICD-10-CM | POA: Diagnosis not present

## 2018-01-20 DIAGNOSIS — Z9049 Acquired absence of other specified parts of digestive tract: Secondary | ICD-10-CM | POA: Diagnosis not present

## 2018-01-20 DIAGNOSIS — J432 Centrilobular emphysema: Secondary | ICD-10-CM | POA: Diagnosis not present

## 2018-01-20 DIAGNOSIS — Z510 Encounter for antineoplastic radiation therapy: Secondary | ICD-10-CM | POA: Diagnosis not present

## 2018-01-20 DIAGNOSIS — Z8051 Family history of malignant neoplasm of kidney: Secondary | ICD-10-CM | POA: Diagnosis not present

## 2018-01-20 DIAGNOSIS — I700 Atherosclerosis of aorta: Secondary | ICD-10-CM | POA: Diagnosis not present

## 2018-01-20 DIAGNOSIS — I2510 Atherosclerotic heart disease of native coronary artery without angina pectoris: Secondary | ICD-10-CM | POA: Diagnosis not present

## 2018-01-20 DIAGNOSIS — R634 Abnormal weight loss: Secondary | ICD-10-CM | POA: Diagnosis not present

## 2018-01-21 ENCOUNTER — Ambulatory Visit
Admission: RE | Admit: 2018-01-21 | Discharge: 2018-01-21 | Disposition: A | Discharge status: Home / Self Care | Payer: Medicare HMO | Source: Ambulatory Visit | Attending: Radiation Oncology | Admitting: Radiation Oncology

## 2018-01-21 DIAGNOSIS — R634 Abnormal weight loss: Secondary | ICD-10-CM | POA: Diagnosis not present

## 2018-01-21 DIAGNOSIS — R69 Illness, unspecified: Secondary | ICD-10-CM | POA: Diagnosis not present

## 2018-01-21 DIAGNOSIS — Z510 Encounter for antineoplastic radiation therapy: Secondary | ICD-10-CM | POA: Diagnosis not present

## 2018-01-21 DIAGNOSIS — Z79899 Other long term (current) drug therapy: Secondary | ICD-10-CM | POA: Diagnosis not present

## 2018-01-21 DIAGNOSIS — Z8051 Family history of malignant neoplasm of kidney: Secondary | ICD-10-CM | POA: Diagnosis not present

## 2018-01-21 DIAGNOSIS — Z9049 Acquired absence of other specified parts of digestive tract: Secondary | ICD-10-CM | POA: Diagnosis not present

## 2018-01-21 DIAGNOSIS — C20 Malignant neoplasm of rectum: Secondary | ICD-10-CM | POA: Diagnosis not present

## 2018-01-21 DIAGNOSIS — I2510 Atherosclerotic heart disease of native coronary artery without angina pectoris: Secondary | ICD-10-CM | POA: Diagnosis not present

## 2018-01-21 DIAGNOSIS — I700 Atherosclerosis of aorta: Secondary | ICD-10-CM | POA: Diagnosis not present

## 2018-01-21 DIAGNOSIS — J432 Centrilobular emphysema: Secondary | ICD-10-CM | POA: Diagnosis not present

## 2018-01-22 ENCOUNTER — Ambulatory Visit
Admission: RE | Admit: 2018-01-22 | Discharge: 2018-01-22 | Disposition: A | Discharge status: Home / Self Care | Payer: Medicare HMO | Source: Ambulatory Visit | Attending: Radiation Oncology | Admitting: Radiation Oncology

## 2018-01-22 DIAGNOSIS — Z8051 Family history of malignant neoplasm of kidney: Secondary | ICD-10-CM | POA: Diagnosis not present

## 2018-01-22 DIAGNOSIS — Z510 Encounter for antineoplastic radiation therapy: Secondary | ICD-10-CM | POA: Diagnosis not present

## 2018-01-22 DIAGNOSIS — R634 Abnormal weight loss: Secondary | ICD-10-CM | POA: Diagnosis not present

## 2018-01-22 DIAGNOSIS — Z9049 Acquired absence of other specified parts of digestive tract: Secondary | ICD-10-CM | POA: Diagnosis not present

## 2018-01-22 DIAGNOSIS — Z79899 Other long term (current) drug therapy: Secondary | ICD-10-CM | POA: Diagnosis not present

## 2018-01-22 DIAGNOSIS — I2510 Atherosclerotic heart disease of native coronary artery without angina pectoris: Secondary | ICD-10-CM | POA: Diagnosis not present

## 2018-01-22 DIAGNOSIS — I700 Atherosclerosis of aorta: Secondary | ICD-10-CM | POA: Diagnosis not present

## 2018-01-22 DIAGNOSIS — R69 Illness, unspecified: Secondary | ICD-10-CM | POA: Diagnosis not present

## 2018-01-22 DIAGNOSIS — J432 Centrilobular emphysema: Secondary | ICD-10-CM | POA: Diagnosis not present

## 2018-01-22 DIAGNOSIS — C20 Malignant neoplasm of rectum: Secondary | ICD-10-CM | POA: Diagnosis not present

## 2018-01-23 ENCOUNTER — Ambulatory Visit: Payer: Medicare HMO

## 2018-01-23 ENCOUNTER — Ambulatory Visit
Admission: RE | Admit: 2018-01-23 | Discharge: 2018-01-23 | Disposition: A | Discharge status: Home / Self Care | Payer: Medicare HMO | Source: Ambulatory Visit | Attending: Radiation Oncology | Admitting: Radiation Oncology

## 2018-01-23 ENCOUNTER — Encounter: Payer: Self-pay | Admitting: Oncology

## 2018-01-23 ENCOUNTER — Telehealth: Payer: Self-pay | Admitting: Oncology

## 2018-01-23 ENCOUNTER — Inpatient Hospital Stay: Payer: Medicare HMO

## 2018-01-23 ENCOUNTER — Inpatient Hospital Stay (HOSPITAL_BASED_OUTPATIENT_CLINIC_OR_DEPARTMENT_OTHER): Payer: Medicare HMO | Admitting: Oncology

## 2018-01-23 VITALS — BP 118/75 | HR 76 | Temp 97.7°F | Resp 18 | Ht 66.0 in | Wt 139.4 lb

## 2018-01-23 DIAGNOSIS — R634 Abnormal weight loss: Secondary | ICD-10-CM | POA: Diagnosis not present

## 2018-01-23 DIAGNOSIS — Z79899 Other long term (current) drug therapy: Secondary | ICD-10-CM

## 2018-01-23 DIAGNOSIS — C20 Malignant neoplasm of rectum: Secondary | ICD-10-CM

## 2018-01-23 DIAGNOSIS — D696 Thrombocytopenia, unspecified: Secondary | ICD-10-CM

## 2018-01-23 DIAGNOSIS — R197 Diarrhea, unspecified: Secondary | ICD-10-CM | POA: Diagnosis not present

## 2018-01-23 DIAGNOSIS — Z8051 Family history of malignant neoplasm of kidney: Secondary | ICD-10-CM | POA: Diagnosis not present

## 2018-01-23 DIAGNOSIS — R69 Illness, unspecified: Secondary | ICD-10-CM | POA: Diagnosis not present

## 2018-01-23 DIAGNOSIS — R110 Nausea: Secondary | ICD-10-CM

## 2018-01-23 DIAGNOSIS — Z510 Encounter for antineoplastic radiation therapy: Secondary | ICD-10-CM | POA: Diagnosis not present

## 2018-01-23 DIAGNOSIS — R42 Dizziness and giddiness: Secondary | ICD-10-CM | POA: Diagnosis not present

## 2018-01-23 DIAGNOSIS — I2510 Atherosclerotic heart disease of native coronary artery without angina pectoris: Secondary | ICD-10-CM | POA: Diagnosis not present

## 2018-01-23 DIAGNOSIS — F17210 Nicotine dependence, cigarettes, uncomplicated: Secondary | ICD-10-CM | POA: Diagnosis not present

## 2018-01-23 DIAGNOSIS — Z9049 Acquired absence of other specified parts of digestive tract: Secondary | ICD-10-CM | POA: Diagnosis not present

## 2018-01-23 DIAGNOSIS — I700 Atherosclerosis of aorta: Secondary | ICD-10-CM | POA: Diagnosis not present

## 2018-01-23 DIAGNOSIS — J432 Centrilobular emphysema: Secondary | ICD-10-CM | POA: Diagnosis not present

## 2018-01-23 LAB — CBC WITH DIFFERENTIAL/PLATELET
Basophils Absolute: 0 10*3/uL (ref 0.0–0.1)
Basophils Relative: 0 %
Eosinophils Absolute: 0.4 10*3/uL (ref 0.0–0.5)
Eosinophils Relative: 5 %
HEMATOCRIT: 43.3 % (ref 38.4–49.9)
HEMOGLOBIN: 14.8 g/dL (ref 13.0–17.1)
LYMPHS ABS: 0.5 10*3/uL — AB (ref 0.9–3.3)
Lymphocytes Relative: 7 %
MCH: 31.4 pg (ref 27.2–33.4)
MCHC: 34.1 g/dL (ref 32.0–36.0)
MCV: 92.1 fL (ref 79.3–98.0)
MONOS PCT: 13 %
Monocytes Absolute: 1 10*3/uL — ABNORMAL HIGH (ref 0.1–0.9)
NEUTROS ABS: 5.6 10*3/uL (ref 1.5–6.5)
NEUTROS PCT: 75 %
Platelets: 201 10*3/uL (ref 140–400)
RBC: 4.7 MIL/uL (ref 4.20–5.82)
RDW: 17.2 % — AB (ref 11.0–14.6)
WBC: 7.5 10*3/uL (ref 4.0–10.3)

## 2018-01-23 LAB — COMPREHENSIVE METABOLIC PANEL
ALK PHOS: 76 U/L (ref 40–150)
ALT: 20 U/L (ref 0–55)
ANION GAP: 9 (ref 3–11)
AST: 19 U/L (ref 5–34)
Albumin: 3.7 g/dL (ref 3.5–5.0)
BUN: 10 mg/dL (ref 7–26)
CALCIUM: 9.4 mg/dL (ref 8.4–10.4)
CO2: 27 mmol/L (ref 22–29)
Chloride: 104 mmol/L (ref 98–109)
Creatinine, Ser: 0.87 mg/dL (ref 0.70–1.30)
GFR calc non Af Amer: >60 mL/min (ref >60)
Glucose, Bld: 94 mg/dL (ref 70–140)
Potassium: 4.7 mmol/L (ref 3.5–5.1)
SODIUM: 140 mmol/L (ref 136–145)
Total Bilirubin: 0.6 mg/dL (ref 0.2–1.2)
Total Protein: 6.8 g/dL (ref 6.4–8.3)

## 2018-01-23 LAB — CEA (IN HOUSE-CHCC): CEA (CHCC-IN HOUSE): 54.08 ng/mL — AB (ref 0.00–5.00)

## 2018-01-23 NOTE — Telephone Encounter (Signed)
Scheduled appt per 2/22 los - lab and f/u in 6 weeks - reminder letter sent in the mail

## 2018-01-23 NOTE — Progress Notes (Signed)
[facility] OFFICE PROGRESS NOTE  Wesley Fenton, NP [place] [address]  DIAGNOSIS: Rectal adenocarcinoma, uT3uN0M0, stag IIA  PRIOR THERAPY: None  CURRENT THERAPY: Concurrent chemoradiation therapy with Xeloda 1500 mg in the morning and 1000 mg in the evening on the days of radiation.  INTERVAL HISTORY: Wesley [name] 72 y.o. male returns for routine follow-up visit accompanied by his daughter.  The patient continues on concurrent chemoradiation.  He is due to complete his radiation on 01/26/2018.  The patient continues to have mild fatigue.  He also has pain to his rectum and feels though he is sitting on a rock.  The patient was given a prescription for tramadol last week, but he has not taken any.  He denies fevers and chills.  Denies chest pain, shortness of breath, cough, hemoptysis.  He reports intermittent nausea without vomiting.  He uses Zofran which is effective.  He reports 1-2 loose stools per day.  If he uses Lomotil 1 tablet/day.  He has been advised in the past that he may use more, but he has not done so.  Denies rectal bleeding or discharge.  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  Denies redness or sensitivity to his hands or feet.  The patient is here for evaluation repeat lab work.  MEDICAL HISTORY: Past Medical History:  Diagnosis Date  . Cancer ([facility]) 10/21/2017   rectal cancer  . Rectal adenocarcinoma ([facility]) 11/07/2017    ALLERGIES:  has No Known Allergies.  MEDICATIONS:  Current Outpatient Medications  Medication Sig Dispense Refill  . capecitabine (XELODA) 500 MG tablet Take 3 tablets (1,500 mg total) by mouth 2 (two) times daily after a meal. Take Monday through Friday when on radiation treatment. 45 tablet 0  . diphenoxylate-atropine (LOMOTIL) 2.5-0.025 MG tablet Take 1-2 tablets by mouth 4 (four) times daily as needed for diarrhea or loose stools. 40 tablet 0  . nicotine (NICODERM CQ) 14 mg/24hr  patch Place 1 patch (14 mg total) onto the skin daily. 30 patch 0  . ondansetron (ZOFRAN) 8 MG tablet Take 1 tablet (8 mg total) by mouth every 8 (eight) hours as needed for nausea or vomiting. 30 tablet 1  . traMADol (ULTRAM) 50 MG tablet Take 1 tablet (50 mg total) by mouth every 6 (six) hours as needed. 30 tablet 0   No current facility-administered medications for this visit.     SURGICAL HISTORY:  Past Surgical History:  Procedure Laterality Date  . CHOLECYSTECTOMY    . EUS N/A 10/31/2017   Procedure: LOWER ENDOSCOPIC ULTRASOUND (EUS);  Surgeon: Carol Ada, MD;  Location: [facility];  Service: Endoscopy;  Laterality: N/A;    REVIEW OF SYSTEMS:   Review of Systems  Constitutional: Negative for appetite change, chills, fever and unexpected weight change. Positive for fatigue. HENT:   Negative for mouth sores, nosebleeds, sore throat and trouble swallowing.   Eyes: Negative for eye problems and icterus.  Respiratory: Negative for cough, hemoptysis, shortness of breath and wheezing.   Cardiovascular: Negative for chest pain and leg swelling.  Gastrointestinal: Negative for abdominal pain, constipation, and vomiting. Positive for rectal pain.  Positive for diarrhea and nausea. Genitourinary: Negative for bladder incontinence, difficulty urinating, dysuria, frequency and hematuria.   Musculoskeletal: Negative for back pain, gait problem, neck pain and neck stiffness.  Skin: Negative for itching and rash.  Neurological: Negative for dizziness, extremity weakness, gait problem, headaches, light-headedness and seizures.  Hematological:  Negative for adenopathy. Does not bruise/bleed easily.  Psychiatric/Behavioral: Negative for confusion, depression and sleep disturbance. The patient is not nervous/anxious.     PHYSICAL EXAMINATION:  Blood pressure 118/75, pulse 76, temperature 97.7 F (36.5 C), temperature source Oral, resp. rate 18, height 5\' 6"  (1.676 m), weight 139 lb 6.4 oz  (63.2 kg), SpO2 100 %.  ECOG PERFORMANCE STATUS: 1 - Symptomatic but completely ambulatory  Physical Exam  Constitutional: Oriented to person, place, and time and well-developed, well-nourished, and in no distress. No distress.  HENT:  Head: Normocephalic and atraumatic.  Mouth/Throat: Oropharynx is clear and moist. No oropharyngeal exudate.  Eyes: Conjunctivae are normal. Right eye exhibits no discharge. Left eye exhibits no discharge. No scleral icterus.  Neck: Normal range of motion. Neck supple.  Cardiovascular: Normal rate, regular rhythm, normal heart sounds and intact distal pulses.   Pulmonary/Chest: Effort normal and breath sounds normal. No respiratory distress. No wheezes. No rales.  Abdominal: Soft. Bowel sounds are normal. Exhibits no distension and no mass. There is no tenderness.  Musculoskeletal: Normal range of motion. Exhibits no edema.  Lymphadenopathy:    No cervical adenopathy.  Neurological: Alert and oriented to person, place, and time. Exhibits normal muscle tone. Gait normal. Coordination normal.  Skin: Skin is warm and dry. No rash noted. Not diaphoretic. No erythema. No pallor.  Psychiatric: Mood, memory and judgment normal.  Rectal: External exam reveals mild perineal erythema; moist desquamation noted to the gluteal cleft. Vitals reviewed.  LABORATORY DATA: Lab Results  Component Value Date   WBC 7.5 01/23/2018   HGB 14.8 01/23/2018   HCT 43.3 01/23/2018   MCV 92.1 01/23/2018   PLT 201 01/23/2018      Chemistry      Component Value Date/Time   NA 140 01/23/2018 1325   NA 138 11/21/2017 1111   K 4.7 01/23/2018 1325   K 4.6 11/21/2017 1111   CL 104 01/23/2018 1325   CL 107 11/12/2014 1149   CO2 27 01/23/2018 1325   CO2 25 11/21/2017 1111   BUN 10 01/23/2018 1325   BUN 9.2 11/21/2017 1111   CREATININE 0.87 01/23/2018 1325   CREATININE 1.0 11/21/2017 1111      Component Value Date/Time   CALCIUM 9.4 01/23/2018 1325   CALCIUM 9.4 11/21/2017  1111   ALKPHOS 76 01/23/2018 1325   ALKPHOS 89 11/21/2017 1111   AST 19 01/23/2018 1325   AST 19 11/21/2017 1111   ALT 20 01/23/2018 1325   ALT 17 11/21/2017 1111   BILITOT 0.6 01/23/2018 1325   BILITOT 0.74 11/21/2017 1111       RADIOGRAPHIC STUDIES:  No results found.   ASSESSMENT/PLAN:  Wesley [name] is a76 y.o.caucasian male with history of anxiety, depression, tobacco use presenting with painless rectal bleeding, abdominal pain, nausea, diarrhea, fatigue, and 25 lbs weight loss over 1 year.   1. Rectal adenocarcinoma, uT3uN0M0, stag IIA -Wesley [name] appears stable today. His fatigue has improved with reduced PM dose. VS and weight stable. Labs reviewed and are adequate to continue treatment. He will continue current chemoRT with Xeloda 1500 MG AM and 1000 mg PM on days with radiation.  He is due to complete radiation on 01/26/2018.  He will take his last dose of Xeloda on that date.  We discussed the role for surgery with the patient.  Recommend that he consider surgery to be done within 2 months of completing his treatment.  The patient is not interested in surgery at this time.  He will let us know if he changes his mind.  2. Nausea, diarrhea, weight loss -He has AM nausea, managed with zofran PRN.  -Reports having diarrhea about 1-2 times per day.  He is only taking Lomotil 1 tablet daily.  I have reviewed that he may take up to 8 tablets of Lomotil per day.  He may also add Imodium.    3. Dizziness -Dizziness has resolved at this time.  Recommend that he continue po intake and oral hydration.  4. Rectal pain -He has radiation changes to perineum. His pain increases when he sits on hard surfaces.   He was encouraged to use tramadol 50 mg 1 tablet every 6 hours as needed for pain.  5.  Smoking cessation -Patient has expressed interest in quitting smoking.  He smokes 1 pack/day on average. -He indicates that he is not ready to quit yet. -He has previously been  given written information about smoking cessation support groups and the telephone number to the quit line. -Prescription for NicoDerm patches has previously been sent to his pharmacy.  He is aware that he may start these when he feels ready to quit smoking.  PLAN -Continue chemoRT with Xeloda 1500 mg AM/1000 mg PM on days with radiation.  Last dose 01/26/2018. -Continue Zofran 8 mg as needed for nausea -Continue to increase po intake, liquids -Tramadol 50 mg every 6 hours as needed for pain -Lomotil for diarrhea up to 8 tablets/day.  May add an Imodium as needed. -NicoDerm patches for smoking cessation when he is ready. -The patient was encouraged to consider referral to a surgeon to discuss surgery.  He will let us know if he wishes to pursue this. -Follow-up visit in 6 weeks for evaluation and repeat lab work.  All questions were answered. The patient knows to call the clinic with any problems, questions or concerns. No barriers to learning was detected.  Orders Placed This Encounter  Procedures  . CBC with Differential (Cancer Center Only)    Standing Status:   Future    Standing Expiration Date:   01/23/2019  . CMP ([facility] only)    Standing Status:   Future    Standing Expiration Date:   01/23/2019  . CEA (IN HOUSE-[facility])    Standing Status:   Future    Standing Expiration Date:   01/23/2019    Mikey Bussing, DNP, AGPCNP-BC, AOCNP 01/23/18  Addendum  I have seen the patient, examined him. I agree with the assessment and and plan and have edited the notes.   Wesley [name] is been tolerating concurrent chemoradiation well, lab reviewed, no concerns.  His pain is manageable.  He will complete treatment next Monday.  We discussed the standard therapy is surgery in 6-8 weeks, the possibility of complete response and cure with concurrent chemo and radiation alone is pretty low (5-10%).  Patient understands if he missed the surgical window, surgery will be very difficult down the  road due to the radiation effect.  Patient is not interested in surgery at this point.  I encouraged him to consider a surgical consult, to get more information.  He will call us if he changes his mind.  We discussed the surveillance, he is at high risk for cancer regrowth and metastasis if he does not have surgery.  He voiced good understanding. I will see him back in 6 weeks.   Truitt Merle  01/23/2018

## 2018-01-26 ENCOUNTER — Ambulatory Visit
Admission: RE | Admit: 2018-01-26 | Discharge: 2018-01-26 | Disposition: A | Discharge status: Home / Self Care | Payer: Medicare HMO | Source: Ambulatory Visit | Attending: Radiation Oncology | Admitting: Radiation Oncology

## 2018-01-26 DIAGNOSIS — Z9049 Acquired absence of other specified parts of digestive tract: Secondary | ICD-10-CM | POA: Diagnosis not present

## 2018-01-26 DIAGNOSIS — J432 Centrilobular emphysema: Secondary | ICD-10-CM | POA: Diagnosis not present

## 2018-01-26 DIAGNOSIS — I700 Atherosclerosis of aorta: Secondary | ICD-10-CM | POA: Diagnosis not present

## 2018-01-26 DIAGNOSIS — Z8051 Family history of malignant neoplasm of kidney: Secondary | ICD-10-CM | POA: Diagnosis not present

## 2018-01-26 DIAGNOSIS — I2510 Atherosclerotic heart disease of native coronary artery without angina pectoris: Secondary | ICD-10-CM | POA: Diagnosis not present

## 2018-01-26 DIAGNOSIS — Z79899 Other long term (current) drug therapy: Secondary | ICD-10-CM | POA: Diagnosis not present

## 2018-01-26 DIAGNOSIS — R634 Abnormal weight loss: Secondary | ICD-10-CM | POA: Diagnosis not present

## 2018-01-26 DIAGNOSIS — C20 Malignant neoplasm of rectum: Secondary | ICD-10-CM | POA: Diagnosis not present

## 2018-01-26 DIAGNOSIS — R69 Illness, unspecified: Secondary | ICD-10-CM | POA: Diagnosis not present

## 2018-01-26 DIAGNOSIS — Z510 Encounter for antineoplastic radiation therapy: Secondary | ICD-10-CM | POA: Diagnosis not present

## 2018-01-27 ENCOUNTER — Encounter: Payer: Self-pay | Admitting: Radiation Oncology

## 2018-01-27 NOTE — Progress Notes (Signed)
  Radiation Oncology         [phone]) [phone] ________________________________  Name: Wesley [name] MRN: [id number]  Date: 01/27/2018  DOB: 10-Apr-1946  End of Treatment Note  Diagnosis: 72 year old male with newly diagnosed rectal adenocarcinoma, uT3uN0M0, stag IIA    Indication for treatment: Curative       Radiation treatment dates: 12/15/17-01/26/18  Site/dose: 1) Rectum/ 45 Gy in 25 fractions   2) Rectal boost/ 9 Gy in 5 fractions  Beams/energy: 1) 3D/ 15X, 6X   2) Complex isodose plan/ 15X, 6X  Narrative: The patient tolerated radiation treatment relatively well.  During treatment the patient complained of 4/10 pain to the rectum and discharge from the rectum following bowel movements. He also complained of fatigue.   Plan: The patient has completed radiation treatment. The patient will return to radiation oncology clinic for routine followup in one month. I advised them to call or return sooner if they have any questions or concerns related to their recovery or treatment.  ------------------------------------------------  Jodelle Gross, MD, PhD  This document serves as a record of services personally performed by Kyung Rudd, MD. It was created on his behalf by Bethann Humble, a trained medical scribe. The creation of this record is based on the scribe's personal observations and the provider's statements to them. This document has been checked and approved by the attending provider.

## 2018-02-23 ENCOUNTER — Other Ambulatory Visit: Payer: Self-pay

## 2018-02-23 ENCOUNTER — Ambulatory Visit
Admission: RE | Admit: 2018-02-23 | Discharge: 2018-02-23 | Disposition: A | Discharge status: Home / Self Care | Payer: Medicare HMO | Source: Ambulatory Visit | Attending: Radiation Oncology | Admitting: Radiation Oncology

## 2018-02-23 VITALS — BP 115/70 | HR 76 | Temp 97.8°F | Resp 18 | Ht 66.0 in | Wt 139.0 lb

## 2018-02-23 DIAGNOSIS — Z79899 Other long term (current) drug therapy: Secondary | ICD-10-CM | POA: Diagnosis not present

## 2018-02-23 DIAGNOSIS — C20 Malignant neoplasm of rectum: Secondary | ICD-10-CM | POA: Diagnosis not present

## 2018-02-23 DIAGNOSIS — Z66 Do not resuscitate: Secondary | ICD-10-CM | POA: Diagnosis not present

## 2018-02-23 MED ORDER — HYDROCORTISONE ACETATE 25 MG RE SUPP: 25.0000 mg | Freq: Two times a day (BID) | RECTAL | 0 refills | Status: DC

## 2018-02-23 NOTE — Progress Notes (Signed)
Radiation Oncology         [phone]) [phone] ________________________________  Name: Wesley [name] MRN: [id number]  Date of Service: 02/23/2018 DOB: Apr 05, 1946  Post Treatment Note  CC: Jearld Fenton, NP  Jearld Fenton, NP  Diagnosis:   Stage IIA, cT3N0M0 adenocarcinoma of the rectum.   Interval Since Last Radiation:  4 weeks   12/15/17-01/26/18: 1. Rectum/ 45 Gy in 25 fractions 2. Rectal boost/ 9 Gy in 5 fractions   Narrative:  The patient returns today for routine follow-up.  He tolerated raditoherapy well but did have loose BMs and discomfort with passing stool, fatigue, and rectal discharge. He comes today for follow up and has not decided yet if he is going to have surgery.                             On review of systems, the patient states he's been having some significant problems with loose BMs and episodes of incontinence. He has had two episodes of fecal incontinence at restaurants. He's been very disappointed by this and has also had feelings of giving up. He denies suicidal ideation. He does state though that if he ever had a cardiac event he would not want to be resuscitated. He denies any urinary symptoms at this time. He also states he feels supported by his family and wants to get better. He's just not sure that that includes surgery at this time. He will follow up with Dr. Burr Medico in a few weeks.  ALLERGIES:  has No Known Allergies.  Meds: Current Outpatient Medications  Medication Sig Dispense Refill  . diphenoxylate-atropine (LOMOTIL) 2.5-0.025 MG tablet Take 1-2 tablets by mouth 4 (four) times daily as needed for diarrhea or loose stools. 40 tablet 0  . ondansetron (ZOFRAN) 8 MG tablet Take 1 tablet (8 mg total) by mouth every 8 (eight) hours as needed for nausea or vomiting. 30 tablet 1  . traMADol (ULTRAM) 50 MG tablet Take 1 tablet (50 mg total) by mouth every 6 (six) hours as needed. 30 tablet 0  . nicotine (NICODERM CQ) 14 mg/24hr patch Place 1 patch (14 mg  total) onto the skin daily. (Patient not taking: Reported on 02/23/2018) 30 patch 0   No current facility-administered medications for this encounter.     Physical Findings:  height is 5\' 6"  (1.676 m) and weight is 139 lb (63 kg). His oral temperature is 97.8 F (36.6 C). His blood pressure is 115/70 and his pulse is 76. His respiration is 18.  Pain Assessment Pain Score: 0-No pain/10 In general this is a well appearing caucasian male in no acute distress. He's alert and oriented x4 and appropriate throughout the examination. Cardiopulmonary assessment is negative for acute distress and he exhibits normal effort.   Lab Findings: Lab Results  Component Value Date   WBC 7.5 01/23/2018   HGB 14.8 01/23/2018   HCT 43.3 01/23/2018   MCV 92.1 01/23/2018   PLT 201 01/23/2018     Radiographic Findings: No results found.  Impression/Plan: 1. Stage IIA, cT3N0M0 adenocarcinoma of the rectum. The patient is still recovering from the effects of radiotherapy and I encouraged him to consider Anusol suppositories. I'll call this in for him at his pharmacy in [place]. We discussed repeat evaluation with Dr. Burr Medico and he is not planning to proceed with even meeting with surgery. I'll follow up with him in one week to ensure he's made his decision.  2. Desires DNR status. After discussing his desires he is specific on not wanting resuscitation. A DNR form was filled out and given to the patient after counseling on what this means. 3. Feelings of being overwhelmed. He denies suicidal ideation but states he's been very frustrated by his symptoms as a result of treatment on his quality of life. I will ask our social work team to check on him and see if we have resources he would benefit from closer to home.      Carola Rhine, PAC

## 2018-02-27 ENCOUNTER — Encounter: Payer: Self-pay | Admitting: *Deleted

## 2018-02-27 NOTE — Progress Notes (Signed)
[place] [name] to inform about purchasing Preparation H with hydrocortisone in it and use twice a day for the next two weeks for rectal symptoms. Left a message on his home phone and cell phone instructions as mentioned above. [place] his daughter Kern Reap since it's Friday to have her contact her father; explained the instructions for the new medication purchase the preparation H with hydrocortisone use twice a day for the next two weeks for rectal symptoms. Mrs. Fagg said her father does not know how to check his messages she was glad I called her and she will make sure he gets the information and start the suppositories.

## 2018-03-02 ENCOUNTER — Encounter: Payer: Self-pay | Admitting: *Deleted

## 2018-03-02 NOTE — Progress Notes (Signed)
[facility] in [place], [place]. To inform the pharmacist that the anusol suppository has been changed to an over the counter suppository and will not require prior approval.

## 2018-03-02 NOTE — Progress Notes (Signed)
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  Stated he has not started using the preparation H with hydrocortisone suppository.  Encouraged him to use the suppository bid for the next two weeks per Shona Simpson, PA-C to help with his rectal symptoms per the telephone message and the discussion with your daughter on Friday.  He said he understood and would start the suppository usage bid and mentioned he has an appointment this week with the cancer doctor 03-06-18.

## 2018-03-04 NOTE — Progress Notes (Signed)
[place]  [phone] Fax:([phone]) [phone]  Clinic Follow Up Note   Patient Care Team: Jearld Fenton, NP as PCP - General (Internal Medicine) Carol Ada, MD as Consulting Physician (Gastroenterology) Truitt Merle, MD as Consulting Physician (Hematology) Alla Feeling, NP as Nurse Practitioner (Nurse Practitioner)   Date of Service:  03/06/2018   CHIEF COMPLAINTS:  Follow up rectal cancer    Oncology History   Cancer Staging Rectal adenocarcinoma [facility]) Staging form: Colon and Rectum, AJCC 8th Edition - Clinical stage from 10/31/2017: Stage IIA (cT3, cN0, cM0) - Signed by Truitt Merle, MD on 11/28/2017       Rectal adenocarcinoma ([place])   10/21/2017 Procedure    COLONOSCOPY per Dr. Carol Ada Findings: A fungating, infiltrative and ulcerated non--obstructing large mass was found in the rectum.  The mass was circumferential.  The mass measured 3 cm in length.  Oozing was present.  This was biopsied with a cold snare for histology.  2 sessile polyps were found in the transverse colon and ascending colon.  The polyps were 3-4 mm in size.  The polyps were removed with a cold snare.  Section and retrieval were complete.  A 15 mm polyp was found in the sigmoid colon.  The polyp was semi-pedunculated.  The polyp was removed with a hot snare.  Resection and retrieval were complete  The mass was extremely friable and palpable with a rectal examination.  The distal extent was 5 cm from the dentate line and it was 3 cm in length.  Specimens were obtained using a cold snare.  Retroflexion was not possible with the close proximity of the lesion in the rectum.         10/21/2017 Initial Biopsy    Final microscopic diagnosis A.  Colon, ascending, polyp: Tubular adenoma No high-grade dysplasia or malignancy  B.  Colon, transverse, polyp: Tubular adenoma No high-grade dysplasia or malignancy  C.  Colon, sigmoid, polyp: Tubular adenoma with high-grade  dysplasia and mucosal prolapse  D.  Rectum, mass, biopsy: Invasive adenocarcinoma, moderately differentiated Immunohistochemical stains for ML H1, [place] 2, [place] 6 and PMS 2 are intact (normal)  There is no evidence of DNA mismatch repair deficiency in the carcinoma, indicating that the tumor is most likely microsatellite stable, and that Lynch syndrome (a common cause of hereditary non-polyposis colorectal cancer or HNPCC) is very unlikely      10/21/2017 Imaging    CT A/P W CONTRAST IMPRESSION: 1. Irregular mural and mucosal thickening of the rectum with small right 9 mm short axis index lymph node adjacent to the rectum. 2. Diffuse mild to moderate fluid-filled distention of small bowel loops query ileus or dysmotility. No mechanical source obstruction is seen. 3. Submucosal fatty infiltration of the descending and sigmoid colon possibly representing stigmata of chronic inflammatory bowel disease.       10/21/2017 Initial Diagnosis    Rectal adenocarcinoma ([place])      10/31/2017 Procedure    EUS: Findings: A hypoechoic mass was found in the rectum. The mass was encountered at 5 cm (from the anal verge). The mass was partially circumferential (involving 50% of the lumen). The endosonographic borders were poorly-defined and irregular. The mass measured 30 mm (in maximum length) by 13 mm (in maximum thickness). There was sonographic evidence suggesting breakthrough of the muscularis propria with invasion into the perirectal fat (uT3, manifested by prominent pseudopodia). There was no sonographic evidence of invasion into the adjacent structures. Area was tattooed with an  injection of 3 mL of Spot (carbon black) both proximally and distally. I was not able to identify any significant lymph nodes. - Rectal mass was visualized endosonographically. A tissue diagnosis was obtained prior to this exam. This is of adenocarcinoma. This was staged uT3 uN0. Tattooed. - No specimens collected.       11/21/2017 Imaging    CT Chest 11/21/17  IMPRESSION: 1. No evidence of thoracic metastatic disease. 2. Moderate centrilobular emphysema. 3. Mild coronary artery and Aortic Atherosclerosis (ICD10-I70.0).      12/15/2017 - 01/26/2018 Chemotherapy    concurrent chemoradiation with Xeloda 1500 mg BID Monday - Friday. Dose reduced on 12/29/17 to 1500 mg in the AM and 1000 mg in PM      12/16/2017 - 01/26/2018 Radiation Therapy     concurrent chemoradiation at [place] ILLNESS:  Wesley [name] 72 y.o. male is here because of newly diagnosed rectal cancer. He reports intermittent painless rectal bleeding with associated abdominal pain, diarrhea, nausea, fatigue, and 25 pounds weight loss for 1 year. reported usual weight is 160 lbs. He attributed rectal bleeding to hemorrhoids and delayed seeking medical care because he is distrustful of the health care system. He does not have large appetite in general, eats 1 meal per day at baseline. Denies bloating, cramping, dysphagia, emesis, or early satiety. He presented to medical doctor who ordered colonoscopy which found a fungating, infiltrative, and ulcerated non-obstructing rectal mass, 2 sessile polyps in the transverse and ascending colon and 1 semi-pedunculated polyp in the sigmoid colon. The rectal mass was palpable on digital rectal exam. He then had CT abdomen and pelvis which showed irregular mural and mucosal thickening of the rectum with small right 9 mm short axis index lymph node adjacent to the rectum. EUS confirmed sonographic evidence suggesting breakthrough of the muscularis proprioa with invasion into the perirectal fat; there was no evidence of invasion into adjacent structures; there were no identifiable significant lymph nodes.   In the past he has never had a colonoscopy. He has past medical history anxiety, depression, insomnia, hyperlipidemia, and tobacco use. He has smoked 1 PPD for 56 years. Denies  alcohol or other drug use. He is widowed, lives alone, has 3 adult children who are healthy. One daughter and his sister accompany him to consult. This daughter lives approx 20 minutes away, sister lives 1 hour away. He drives dump truck. He is independent with ADL's.   Today he continues to report painless rectal bleeding, intermittent nausea and diarrhea. Imodium has not improved much. He is moderately fatigued but continues to work. He is willing to consider treatment but does not want to be a burden on his family.    CURRENT THERAPY: Surveillence  INTERVAL HISTORY  Wesley [name] is here for a follow up. He presents to the clinic today by himself. He finished ChemoRT on 01/26/18. Pt states he has recovered but he had an episode of bowel incontinence yesterday. He states he has 1 bowel movement a day that is usually regular. He reports he eats 1 meal a day and its a good sized meal.   On review of systems, pt denies pain with bowel movement, rectal bleeding, blood in stool, or any other complaints at this time. Pertinent positives are listed and detailed within the above HPI.   REVIEW OF SYSTEMS:  Constitutional: Denies fevers, chills or abnormal night sweats (+) low appetite at baseline (+) stable weight   Eyes: Denies  blurriness of vision, double vision or watery eyes Ears, nose, mouth, throat, and face: Denies mucositis or sore throat Respiratory: Denies dyspnea or wheezes (+) "smoker's cough" Cardiovascular: Denies palpitation, chest discomfort or lower extremity swelling Gastrointestinal:  Denies abdominal pain, bloating, cramping, early satiety, constipation, emesis, heartburn    Skin: Denies abnormal skin rashes Lymphatics: Denies new lymphadenopathy or easy bruising Neurological:Denies numbness, tingling or new weaknesses  Behavioral/Psych: Mood is stable, no new changes (+) depression (+) anxiety All other systems were reviewed with the patient and are negative.   MEDICAL  HISTORY:  Past Medical History:  Diagnosis Date  . Cancer ([place]) 10/21/2017   rectal cancer  . Rectal adenocarcinoma ([place]) 11/07/2017    SURGICAL HISTORY: Past Surgical History:  Procedure Laterality Date  . CHOLECYSTECTOMY    . EUS N/A 10/31/2017   Procedure: LOWER ENDOSCOPIC ULTRASOUND (EUS);  Surgeon: Carol Ada, MD;  Location: [facility];  Service: Endoscopy;  Laterality: N/A;    SOCIAL HISTORY: Social History   Socioeconomic History  . Marital status: Widowed    Spouse name: Not on file  . Number of children: 3  . Years of education: Not on file  . Highest education level: Not on file  Occupational History  . Occupation: truck Animator Needs  . Financial resource strain: Not on file  . Food insecurity:    Worry: Not on file    Inability: Not on file  . Transportation needs:    Medical: Not on file    Non-medical: Not on file  Tobacco Use  . Smoking status: Current Every Day Smoker    Packs/day: 1.00    Years: 56.00    Pack years: 56.00    Types: Cigarettes  . Smokeless tobacco: Never Used  Substance and Sexual Activity  . Alcohol use: No    Alcohol/week: 0.0 oz  . Drug use: No  . Sexual activity: Not Currently  Lifestyle  . Physical activity:    Days per week: Not on file    Minutes per session: Not on file  . Stress: Not on file  Relationships  . Social connections:    Talks on phone: Not on file    Gets together: Not on file    Attends religious service: Not on file    Active member of club or organization: Not on file    Attends meetings of clubs or organizations: Not on file    Relationship status: Not on file  . Intimate partner violence:    Fear of current or ex partner: Not on file    Emotionally abused: Not on file    Physically abused: Not on file    Forced sexual activity: Not on file  Other Topics Concern  . Not on file  Social History Narrative  . Not on file    FAMILY HISTORY: Family History  Problem Relation Age of  Onset  . Cancer Mother        Kidney  . Diabetes Mother   . Cancer Paternal Uncle        colon  . Cancer Paternal Aunt 42       breast cancer  . Hyperlipidemia Neg Hx   . Hypertension Neg Hx   . Stroke Neg Hx     ALLERGIES:  has No Known Allergies.  MEDICATIONS:  Current Outpatient Medications  Medication Sig Dispense Refill  . diphenoxylate-atropine (LOMOTIL) 2.5-0.025 MG tablet Take 1-2 tablets by mouth 4 (four) times daily as needed for diarrhea or  loose stools. (Patient not taking: Reported on 03/06/2018) 40 tablet 0  . hydrocortisone (ANUSOL-HC) 25 MG suppository Place 1 suppository (25 mg total) rectally 2 (two) times daily. (Patient not taking: Reported on 03/06/2018) 12 suppository 0  . nicotine (NICODERM CQ) 14 mg/24hr patch Place 1 patch (14 mg total) onto the skin daily. (Patient not taking: Reported on 02/23/2018) 30 patch 0  . ondansetron (ZOFRAN) 8 MG tablet Take 1 tablet (8 mg total) by mouth every 8 (eight) hours as needed for nausea or vomiting. (Patient not taking: Reported on 03/06/2018) 30 tablet 1  . traMADol (ULTRAM) 50 MG tablet Take 1 tablet (50 mg total) by mouth every 6 (six) hours as needed. (Patient not taking: Reported on 03/06/2018) 30 tablet 0   No current facility-administered medications for this visit.     PHYSICAL EXAMINATION:  ECOG PERFORMANCE STATUS: 1 - Symptomatic but completely ambulatory  Vitals:   03/06/18 1314  BP: 124/72  Pulse: 68  Resp: 20  Temp: 97.9 F (36.6 C)  SpO2: 97%   Filed Weights   03/06/18 1314  Weight: 137 lb 11.2 oz (62.5 kg)    GENERAL:alert, no distress and comfortable SKIN: skin color, texture, turgor are normal, no rashes or significant lesions EYES: normal, conjunctiva are pink and non-injected, sclera clear OROPHARYNX:no exudate, no erythema and lips, buccal mucosa, and tongue normal  NECK: supple, thyroid normal size, non-tender, without nodularity LYMPH:  no palpable cervical, supraclavicular, axillary, or  inguinal lymphadenopathy  LUNGS: clear to auscultation bilaterally with normal breathing effort HEART: regular rate & rhythm and no murmurs and no lower extremity edema ABDOMEN:abdomen soft, non-tender and normal bowel sounds. No palpable hepatosplenomegaly Musculoskeletal:no cyanosis of digits and no clubbing  PSYCH: alert & oriented x 3 with fluent speech NEURO: no focal motor/sensory deficits RECTAL patient refused digital rectal exam  LABORATORY DATA:  I have reviewed the data as listed CBC Latest Ref Rng & Units 03/06/2018 01/23/2018 01/16/2018  WBC 4.0 - 10.3 K/uL 7.7 7.5 6.6  Hemoglobin 13.0 - 17.1 g/dL - 14.8 14.7  Hematocrit 38.4 - 49.9 % 42.8 43.3 42.6  Platelets 140 - 400 K/uL 232 201 171   CMP Latest Ref Rng & Units 03/06/2018 01/23/2018 01/16/2018  Glucose 70 - 140 mg/dL 110 94 132  BUN 7 - 26 mg/dL _0 Creatinine 0.70 - 1.30 mg/dL 0.84 0.87 1.03  Sodium 136 - 145 mmol/L 139 140 137  Potassium 3.5 - 5.1 mmol/L 4.3 4.7 3.5  Chloride 98 - 109 mmol/L 107 104 102  CO2 22 - 29 mmol/L _1 Calcium 8.4 - 10.4 mg/dL 9.3 9.4 9.3  Total Protein 6.4 - 8.3 g/dL 6.7 6.8 7.0  Total Bilirubin 0.2 - 1.2 mg/dL 0.4 0.6 0.8  Alkaline Phos 40 - 150 U/L 72 76 68  AST 5 - 34 U/L _2 ALT 0 - 55 U/L _3 CEA:  10/20/17: 396 12/15/17: 434.60 12/22/17: 377.99 01/23/18: 54.08 03/06/18: Pending   RADIOGRAPHIC STUDIES: I have personally reviewed the radiological images as listed and agreed with the findings in the report. No results found.   CT Chest WO Contrast 11/21/17 IMPRESSION: 1. No evidence of thoracic metastatic disease. 2. Moderate centrilobular emphysema. 3. Mild coronary artery and Aortic Atherosclerosis (ICD10-I70.0).  CT AP W Contrast 10/21/17 IMPRESSION: 1. Irregular mural and mucosal thickening of the rectum with small right 9 mm short axis index lymph node adjacent to the rectum. 2. Diffuse mild  to moderate fluid-filled distention of small bowel loops  query ileus or dysmotility. No mechanical source obstruction is seen. 3. Submucosal fatty infiltration of the descending and sigmoid colon possibly representing stigmata of chronic inflammatory bowel disease.  ASSESSMENT & PLAN:  Wesley [name] is a 72 y.o. caucasian male with history of anxiety, depression, tobacco use presenting with painless rectal bleeding, abdominal pain, nausea, diarrhea, fatigue, and 25 lbs weight loss over 1 year.   1. Rectal adenocarcinoma, uT3uN0M0, stag IIA -We previously reviewed imaging and pathology results in detail. He has what appears to be early stage rectal cancer, uT3N0,  CT chest, abdomen and pelvis was negative for distant metastasis, this was reviewed with patient. -We previously discussed standard treatment regimen which includes neoadjuvant chemoradiation with xeloda followed by definitive surgery and possible adjuvant systemic chemo, we discussed potential side effects in detail. The patient is initially not very interested in any treatment, after repeated discussion, he is open to radiation an chemo  -He has repeated declined the option of surgery at this time.  -He started chemoRT with Xeloda 1500 mg BID and radiation therapy with Dr. Lisbeth Renshaw Monday - Friday on 12/15/17 .  -Due to his fatigue, I reduced his Xeloda to 1500 mg in the morning and 1000 mg in the evening on 01/02/18. I encouraged him to continue radiation, which is his main treatment, and try not to skip treatment. -He completed  ChemoRadiation on 01/26/18. -Pt still wishes to forego surgery at this time. I discussed that the possibility of cure with chemo and radiation is small, he likely still have residual disease or develop recurrence in the future.  We will follow up with him for surveillance and plan for a CT AP in 3 months -Labs reviewed, his blood counts have recovered, normal kidney and liver function.  -pt declined rectal exam today, plan to do it next visit.  -F/u in 3 months with  scan.    2. Mild diarrhea, weight loss -He has intermittent nausea, diarrhea, and 25 pounds weight loss over 1 year.  -He has tried imodium without much relief. I prescribed lomotil to his pharmacy on 11/07/17. For nausea without vomiting, I prescribed Zofran PRN.  -His appetite is low at baseline; eats 1 meal per day. I previously recommended increasing po intake and placed a referral to dietician.  -Overall improved after chemoradiation, he still has a mild loose bowel movement 1-2/day, he knows to use Imodium as needed. -He is not gaining any weight back, I encouraged him to eat more if he can  3. Rectal pain and bowel incontinence  -His rectal pain has resolved since he has recovered from chemo and radiation.  He has occasional stool incontinence after treatment. -I encouraged pt to consider PT for his pelvic muscles so he can regain some strength and control in that area. He declined at this time -He can continue to use imodium for diarrhea symptoms    4. Smoking cessation -Patient has expressed interest in quitting smoking. He smokes 1 pack/day on average. -He indicates that he is not ready to quit yet. -He has previously been given written information about smoking cessation support groups and the telephone number to the quit line. -Prescription for NicoDerm patches has previously been sent to his pharmacy.  He is aware that he may start these when he feels ready to quit smoking.  PLAN: -CT AP W Contrast in 3 months  -Lab and f/u in 3 months  -pt declines surgery  -pt declined rectal  exam today, plan to do it next visit.    All questions were answered. The patient knows to call the clinic with any problems, questions or concerns. I spent 20 minutes counseling the patient face to face. The total time spent in the appointment was 25 minutes and more than 50% was on counseling, review of test results, and coordination of care.  This document serves as a record of services  personally performed by Truitt Merle, MD. It was created on her behalf by Theresia Bough, a trained medical scribe. The creation of this record is based on the scribe's personal observations and the provider's statements to them.   I have reviewed the above documentation for accuracy and completeness, and I agree with the above.    Truitt Merle  03/06/2018   .

## 2018-03-06 ENCOUNTER — Encounter: Payer: Self-pay | Admitting: Hematology

## 2018-03-06 ENCOUNTER — Telehealth: Payer: Self-pay | Admitting: Hematology

## 2018-03-06 ENCOUNTER — Inpatient Hospital Stay: Payer: Medicare HMO | Attending: Hematology | Admitting: Hematology

## 2018-03-06 ENCOUNTER — Inpatient Hospital Stay: Payer: Medicare HMO

## 2018-03-06 VITALS — BP 124/72 | HR 68 | Temp 97.9°F | Resp 20 | Ht 66.0 in | Wt 137.7 lb

## 2018-03-06 DIAGNOSIS — C20 Malignant neoplasm of rectum: Secondary | ICD-10-CM

## 2018-03-06 DIAGNOSIS — R69 Illness, unspecified: Secondary | ICD-10-CM | POA: Diagnosis not present

## 2018-03-06 DIAGNOSIS — F419 Anxiety disorder, unspecified: Secondary | ICD-10-CM | POA: Insufficient documentation

## 2018-03-06 DIAGNOSIS — F17210 Nicotine dependence, cigarettes, uncomplicated: Secondary | ICD-10-CM | POA: Insufficient documentation

## 2018-03-06 DIAGNOSIS — Z923 Personal history of irradiation: Secondary | ICD-10-CM | POA: Insufficient documentation

## 2018-03-06 DIAGNOSIS — F329 Major depressive disorder, single episode, unspecified: Secondary | ICD-10-CM | POA: Insufficient documentation

## 2018-03-06 DIAGNOSIS — G4700 Insomnia, unspecified: Secondary | ICD-10-CM | POA: Diagnosis not present

## 2018-03-06 DIAGNOSIS — J432 Centrilobular emphysema: Secondary | ICD-10-CM | POA: Diagnosis not present

## 2018-03-06 DIAGNOSIS — I700 Atherosclerosis of aorta: Secondary | ICD-10-CM | POA: Insufficient documentation

## 2018-03-06 DIAGNOSIS — E785 Hyperlipidemia, unspecified: Secondary | ICD-10-CM | POA: Diagnosis not present

## 2018-03-06 DIAGNOSIS — R634 Abnormal weight loss: Secondary | ICD-10-CM

## 2018-03-06 DIAGNOSIS — R197 Diarrhea, unspecified: Secondary | ICD-10-CM

## 2018-03-06 DIAGNOSIS — Z79899 Other long term (current) drug therapy: Secondary | ICD-10-CM | POA: Diagnosis not present

## 2018-03-06 DIAGNOSIS — Z9221 Personal history of antineoplastic chemotherapy: Secondary | ICD-10-CM

## 2018-03-06 LAB — CMP (CANCER CENTER ONLY)
ALBUMIN: 3.7 g/dL (ref 3.5–5.0)
ALK PHOS: 72 U/L (ref 40–150)
ALT: 12 U/L (ref 0–55)
ANION GAP: 8 (ref 3–11)
AST: 18 U/L (ref 5–34)
BILIRUBIN TOTAL: 0.4 mg/dL (ref 0.2–1.2)
BUN: 15 mg/dL (ref 7–26)
CALCIUM: 9.3 mg/dL (ref 8.4–10.4)
CO2: 24 mmol/L (ref 22–29)
Chloride: 107 mmol/L (ref 98–109)
Creatinine: 0.84 mg/dL (ref 0.70–1.30)
Glucose, Bld: 110 mg/dL (ref 70–140)
POTASSIUM: 4.3 mmol/L (ref 3.5–5.1)
Sodium: 139 mmol/L (ref 136–145)
TOTAL PROTEIN: 6.7 g/dL (ref 6.4–8.3)

## 2018-03-06 LAB — CBC WITH DIFFERENTIAL (CANCER CENTER ONLY)
BASOS PCT: 0 %
Basophils Absolute: 0 10*3/uL (ref 0.0–0.1)
Eosinophils Absolute: 0.1 10*3/uL (ref 0.0–0.5)
Eosinophils Relative: 2 %
HEMATOCRIT: 42.8 % (ref 38.4–49.9)
HEMOGLOBIN: 14.7 g/dL (ref 13.0–17.1)
LYMPHS ABS: 1 10*3/uL (ref 0.9–3.3)
Lymphocytes Relative: 13 %
MCH: 32.9 pg (ref 27.2–33.4)
MCHC: 34.3 g/dL (ref 32.0–36.0)
MCV: 95.7 fL (ref 79.3–98.0)
MONO ABS: 0.6 10*3/uL (ref 0.1–0.9)
MONOS PCT: 8 %
NEUTROS ABS: 5.9 10*3/uL (ref 1.5–6.5)
Neutrophils Relative %: 77 %
Platelet Count: 232 10*3/uL (ref 140–400)
RBC: 4.47 MIL/uL (ref 4.20–5.82)
RDW: 16.5 % — AB (ref 11.0–14.6)
WBC Count: 7.7 10*3/uL (ref 4.0–10.3)

## 2018-03-06 LAB — CEA (IN HOUSE-CHCC): CEA (CHCC-IN HOUSE): 6.21 ng/mL — AB (ref 0.00–5.00)

## 2018-03-06 NOTE — Telephone Encounter (Signed)
Gave patient avs report and appointments for July. Central radiology will contact patient re scan.  °

## 2018-05-19 ENCOUNTER — Telehealth: Payer: Self-pay

## 2018-05-19 NOTE — Telephone Encounter (Signed)
Spoke with Wesley [name] in [place] need CT done before his appointment with Dr. Burr Medico on 7/12.  She will call patient to schedule.

## 2018-06-08 ENCOUNTER — Encounter (HOSPITAL_COMMUNITY): Payer: Self-pay

## 2018-06-08 ENCOUNTER — Ambulatory Visit (HOSPITAL_COMMUNITY)
Admission: RE | Admit: 2018-06-08 | Discharge: 2018-06-08 | Disposition: A | Discharge status: Home / Self Care | Payer: Medicare HMO | Source: Ambulatory Visit | Attending: Hematology | Admitting: Hematology

## 2018-06-08 ENCOUNTER — Inpatient Hospital Stay: Payer: Medicare HMO | Attending: Hematology

## 2018-06-08 DIAGNOSIS — Z923 Personal history of irradiation: Secondary | ICD-10-CM | POA: Diagnosis not present

## 2018-06-08 DIAGNOSIS — Z9221 Personal history of antineoplastic chemotherapy: Secondary | ICD-10-CM | POA: Diagnosis not present

## 2018-06-08 DIAGNOSIS — K5900 Constipation, unspecified: Secondary | ICD-10-CM | POA: Insufficient documentation

## 2018-06-08 DIAGNOSIS — R3982 Chronic bladder pain: Secondary | ICD-10-CM | POA: Diagnosis not present

## 2018-06-08 DIAGNOSIS — C218 Malignant neoplasm of overlapping sites of rectum, anus and anal canal: Secondary | ICD-10-CM | POA: Diagnosis not present

## 2018-06-08 DIAGNOSIS — C20 Malignant neoplasm of rectum: Secondary | ICD-10-CM | POA: Diagnosis not present

## 2018-06-08 DIAGNOSIS — R634 Abnormal weight loss: Secondary | ICD-10-CM | POA: Insufficient documentation

## 2018-06-08 DIAGNOSIS — R159 Full incontinence of feces: Secondary | ICD-10-CM | POA: Insufficient documentation

## 2018-06-08 DIAGNOSIS — F17210 Nicotine dependence, cigarettes, uncomplicated: Secondary | ICD-10-CM | POA: Insufficient documentation

## 2018-06-08 DIAGNOSIS — I700 Atherosclerosis of aorta: Secondary | ICD-10-CM | POA: Insufficient documentation

## 2018-06-08 DIAGNOSIS — R69 Illness, unspecified: Secondary | ICD-10-CM | POA: Diagnosis not present

## 2018-06-08 DIAGNOSIS — Z79899 Other long term (current) drug therapy: Secondary | ICD-10-CM | POA: Diagnosis not present

## 2018-06-08 LAB — COMPREHENSIVE METABOLIC PANEL
ALBUMIN: 3.8 g/dL (ref 3.5–5.0)
ALT: 14 U/L (ref 0–44)
AST: 17 U/L (ref 15–41)
Alkaline Phosphatase: 72 U/L (ref 38–126)
Anion gap: 7 (ref 5–15)
BUN: 12 mg/dL (ref 8–23)
CHLORIDE: 105 mmol/L (ref 98–111)
CO2: 27 mmol/L (ref 22–32)
CREATININE: 0.95 mg/dL (ref 0.61–1.24)
Calcium: 9.6 mg/dL (ref 8.9–10.3)
GFR calc non Af Amer: >60 mL/min (ref >60)
GLUCOSE: 99 mg/dL (ref 70–99)
Potassium: 4.5 mmol/L (ref 3.5–5.1)
SODIUM: 139 mmol/L (ref 135–145)
Total Bilirubin: 0.3 mg/dL (ref 0.3–1.2)
Total Protein: 7.1 g/dL (ref 6.5–8.1)

## 2018-06-08 LAB — CBC WITH DIFFERENTIAL/PLATELET
BASOS ABS: 0 10*3/uL (ref 0.0–0.1)
BASOS PCT: 1 %
EOS ABS: 0.2 10*3/uL (ref 0.0–0.5)
Eosinophils Relative: 3 %
HCT: 46.6 % (ref 38.4–49.9)
HEMOGLOBIN: 15.7 g/dL (ref 13.0–17.1)
LYMPHS ABS: 0.9 10*3/uL (ref 0.9–3.3)
Lymphocytes Relative: 12 %
MCH: 30.8 pg (ref 27.2–33.4)
MCHC: 33.7 g/dL (ref 32.0–36.0)
MCV: 91.5 fL (ref 79.3–98.0)
Monocytes Absolute: 0.9 10*3/uL (ref 0.1–0.9)
Monocytes Relative: 12 %
NEUTROS PCT: 72 %
Neutro Abs: 5.4 10*3/uL (ref 1.5–6.5)
Platelets: 246 10*3/uL (ref 140–400)
RBC: 5.09 MIL/uL (ref 4.20–5.82)
RDW: 13.5 % (ref 11.0–14.6)
WBC: 7.4 10*3/uL (ref 4.0–10.3)

## 2018-06-08 LAB — CEA (IN HOUSE-CHCC): CEA (CHCC-IN HOUSE): 3.21 ng/mL (ref 0.00–5.00)

## 2018-06-08 MED ORDER — IOPAMIDOL (ISOVUE-300) INJECTION 61%
INTRAVENOUS | Status: AC
  Filled 2018-06-08: qty 100

## 2018-06-08 MED ORDER — IOPAMIDOL (ISOVUE-300) INJECTION 61%
100.0000 mL | Freq: Once | INTRAVENOUS | Status: AC | PRN
  Administered 2018-06-08: 100 mL via INTRAVENOUS

## 2018-06-11 NOTE — Progress Notes (Signed)
[place]  [phone]) [phone] Fax:([phone]) [phone]  Clinic Follow Up Note   Patient Care Team: Jearld Fenton, NP as PCP - General (Internal Medicine) Carol Ada, MD as Consulting Physician (Gastroenterology) Truitt Merle, MD as Consulting Physician (Hematology) Alla Feeling, NP as Nurse Practitioner (Nurse Practitioner)   Date of Service:  06/12/2018   CHIEF COMPLAINTS:  Follow up rectal cancer    Oncology History   Cancer Staging Rectal adenocarcinoma [facility]) Staging form: Colon and Rectum, AJCC 8th Edition - Clinical stage from 10/31/2017: Stage IIA (cT3, cN0, cM0) - Signed by Truitt Merle, MD on 11/28/2017       Rectal adenocarcinoma ([place])   10/21/2017 Procedure    COLONOSCOPY per Dr. Carol Ada Findings: A fungating, infiltrative and ulcerated non--obstructing large mass was found in the rectum.  The mass was circumferential.  The mass measured 3 cm in length.  Oozing was present.  This was biopsied with a cold snare for histology.  2 sessile polyps were found in the transverse colon and ascending colon.  The polyps were 3-4 mm in size.  The polyps were removed with a cold snare.  Section and retrieval were complete.  A 15 mm polyp was found in the sigmoid colon.  The polyp was semi-pedunculated.  The polyp was removed with a hot snare.  Resection and retrieval were complete  The mass was extremely friable and palpable with a rectal examination.  The distal extent was 5 cm from the dentate line and it was 3 cm in length.  Specimens were obtained using a cold snare.  Retroflexion was not possible with the close proximity of the lesion in the rectum.         10/21/2017 Initial Biopsy    Final microscopic diagnosis A.  Colon, ascending, polyp: Tubular adenoma No high-grade dysplasia or malignancy  B.  Colon, transverse, polyp: Tubular adenoma No high-grade dysplasia or malignancy  C.  Colon, sigmoid, polyp: Tubular adenoma with high-grade  dysplasia and mucosal prolapse  D.  Rectum, mass, biopsy: Invasive adenocarcinoma, moderately differentiated Immunohistochemical stains for ML H1, [place] 2, [place] 6 and PMS 2 are intact (normal)  There is no evidence of DNA mismatch repair deficiency in the carcinoma, indicating that the tumor is most likely microsatellite stable, and that Lynch syndrome (a common cause of hereditary non-polyposis colorectal cancer or HNPCC) is very unlikely      10/21/2017 Imaging    CT A/P W CONTRAST IMPRESSION: 1. Irregular mural and mucosal thickening of the rectum with small right 9 mm short axis index lymph node adjacent to the rectum. 2. Diffuse mild to moderate fluid-filled distention of small bowel loops query ileus or dysmotility. No mechanical source obstruction is seen. 3. Submucosal fatty infiltration of the descending and sigmoid colon possibly representing stigmata of chronic inflammatory bowel disease.       10/21/2017 Initial Diagnosis    Rectal adenocarcinoma ([place])      10/31/2017 Procedure    EUS: Findings: A hypoechoic mass was found in the rectum. The mass was encountered at 5 cm (from the anal verge). The mass was partially circumferential (involving 50% of the lumen). The endosonographic borders were poorly-defined and irregular. The mass measured 30 mm (in maximum length) by 13 mm (in maximum thickness). There was sonographic evidence suggesting breakthrough of the muscularis propria with invasion into the perirectal fat (uT3, manifested by prominent pseudopodia). There was no sonographic evidence of invasion into the adjacent structures. Area was tattooed with an  injection of 3 mL of Spot (carbon black) both proximally and distally. I was not able to identify any significant lymph nodes. - Rectal mass was visualized endosonographically. A tissue diagnosis was obtained prior to this exam. This is of adenocarcinoma. This was staged uT3 uN0. Tattooed. - No specimens collected.       11/21/2017 Imaging    CT Chest 11/21/17  IMPRESSION: 1. No evidence of thoracic metastatic disease. 2. Moderate centrilobular emphysema. 3. Mild coronary artery and Aortic Atherosclerosis (ICD10-I70.0).      12/15/2017 - 01/26/2018 Chemotherapy    concurrent chemoradiation with Xeloda 1500 mg BID Monday - Friday. Dose reduced on 12/29/17 to 1500 mg in the AM and 1000 mg in PM      12/16/2017 - 01/26/2018 Radiation Therapy     concurrent chemoradiation at [facility]      06/08/2018 Imaging    CT AP W Contrast  IMPRESSION: 1. Response to therapy of rectal primary and perirectal adenopathy. 2. No new or progressive disease identified. 3. Bladder underdistention.  Cannot exclude concurrent cystitis. 4.  Aortic Atherosclerosis (ICD10-I70.0). 5. Left-sided IVC.      HISTORY OF PRESENTING ILLNESS:  Wesley [name] 72 y.o. male is here because of newly diagnosed rectal cancer. He reports intermittent painless rectal bleeding with associated abdominal pain, diarrhea, nausea, fatigue, and 25 pounds weight loss for 1 year. reported usual weight is 160 lbs. He attributed rectal bleeding to hemorrhoids and delayed seeking medical care because he is distrustful of the health care system. He does not have large appetite in general, eats 1 meal per day at baseline. Denies bloating, cramping, dysphagia, emesis, or early satiety. He presented to medical doctor who ordered colonoscopy which found a fungating, infiltrative, and ulcerated non-obstructing rectal mass, 2 sessile polyps in the transverse and ascending colon and 1 semi-pedunculated polyp in the sigmoid colon. The rectal mass was palpable on digital rectal exam. He then had CT abdomen and pelvis which showed irregular mural and mucosal thickening of the rectum with small right 9 mm short axis index lymph node adjacent to the rectum. EUS confirmed sonographic evidence suggesting breakthrough of the muscularis proprioa with invasion into the  perirectal fat; there was no evidence of invasion into adjacent structures; there were no identifiable significant lymph nodes.   In the past he has never had a colonoscopy. He has past medical history anxiety, depression, insomnia, hyperlipidemia, and tobacco use. He has smoked 1 PPD for 56 years. Denies alcohol or other drug use. He is widowed, lives alone, has 3 adult children who are healthy. One daughter and his sister accompany him to consult. This daughter lives approx 20 minutes away, sister lives 1 hour away. He drives dump truck. He is independent with ADL's.   Today he continues to report painless rectal bleeding, intermittent nausea and diarrhea. Imodium has not improved much. He is moderately fatigued but continues to work. He is willing to consider treatment but does not want to be a burden on his family.    CURRENT THERAPY: Surveillance  INTERVAL HISTORY  Wesley [name] is here for a follow up for his rectal cancer. He presents to the clinic today accompanied by his daughter. He notes stable condition post treatment, not yet fully back at baseline. He notes his stool looks like oatmeal after going to the bathroom only once a week. He has not taken stool softener or laxative. When he goes to the bathroom he his output is plenty. He still does not  want surgery if it is an options. He deferred rectal exam today.     REVIEW OF SYSTEMS:  Constitutional: Denies fevers, chills or abnormal night sweats (+) low appetite at baseline (+) stable weight   Eyes: Denies blurriness of vision, double vision or watery eyes Ears, nose, mouth, throat, and face: Denies mucositis or sore throat Respiratory: Denies dyspnea or wheezes (+) "smoker's cough" Cardiovascular: Denies palpitation, chest discomfort or lower extremity swelling Gastrointestinal:  Denies abdominal pain, bloating  (+) constipation, BM 1/week Skin: Denies abnormal skin rashes Lymphatics: Denies new lymphadenopathy or easy  bruising Neurological:Denies numbness, tingling or new weaknesses  Behavioral/Psych: Mood is stable, no new changes (+) depression (+) anxiety All other systems were reviewed with the patient and are negative.   MEDICAL HISTORY:  Past Medical History:  Diagnosis Date  . Cancer ([place]) 10/21/2017   rectal cancer  . Rectal adenocarcinoma ([place]) 11/07/2017    SURGICAL HISTORY: Past Surgical History:  Procedure Laterality Date  . CHOLECYSTECTOMY    . EUS N/A 10/31/2017   Procedure: LOWER ENDOSCOPIC ULTRASOUND (EUS);  Surgeon: Carol Ada, MD;  Location: [facility];  Service: Endoscopy;  Laterality: N/A;    SOCIAL HISTORY: Social History   Socioeconomic History  . Marital status: Widowed    Spouse name: Not on file  . Number of children: 3  . Years of education: Not on file  . Highest education level: Not on file  Occupational History  . Occupation: truck Animator Needs  . Financial resource strain: Not on file  . Food insecurity:    Worry: Not on file    Inability: Not on file  . Transportation needs:    Medical: Not on file    Non-medical: Not on file  Tobacco Use  . Smoking status: Current Every Day Smoker    Packs/day: 1.00    Years: 56.00    Pack years: 56.00    Types: Cigarettes  . Smokeless tobacco: Never Used  Substance and Sexual Activity  . Alcohol use: No    Alcohol/week: 0.0 oz  . Drug use: No  . Sexual activity: Not Currently  Lifestyle  . Physical activity:    Days per week: Not on file    Minutes per session: Not on file  . Stress: Not on file  Relationships  . Social connections:    Talks on phone: Not on file    Gets together: Not on file    Attends religious service: Not on file    Active member of club or organization: Not on file    Attends meetings of clubs or organizations: Not on file    Relationship status: Not on file  . Intimate partner violence:    Fear of current or ex partner: Not on file    Emotionally abused: Not on  file    Physically abused: Not on file    Forced sexual activity: Not on file  Other Topics Concern  . Not on file  Social History Narrative  . Not on file    FAMILY HISTORY: Family History  Problem Relation Age of Onset  . Cancer Mother        Kidney  . Diabetes Mother   . Cancer Paternal Uncle        colon  . Cancer Paternal Aunt 69       breast cancer  . Hyperlipidemia Neg Hx   . Hypertension Neg Hx   . Stroke Neg Hx     ALLERGIES:  has No Known Allergies.  MEDICATIONS:  No current outpatient medications on file.   No current facility-administered medications for this visit.     PHYSICAL EXAMINATION:  ECOG PERFORMANCE STATUS: 1 - Symptomatic but completely ambulatory  Vitals:   06/12/18 1327  BP: 131/70  Pulse: 78  Resp: 18  Temp: 97.8 F (36.6 C)  SpO2: 97%   Filed Weights   06/12/18 1327  Weight: 135 lb (61.2 kg)    GENERAL:alert, no distress and comfortable SKIN: skin color, texture, turgor are normal, no rashes or significant lesions EYES: normal, conjunctiva are pink and non-injected, sclera clear OROPHARYNX:no exudate, no erythema and lips, buccal mucosa, and tongue normal  NECK: supple, thyroid normal size, non-tender, without nodularity LYMPH:  no palpable cervical, supraclavicular, axillary, or inguinal lymphadenopathy  LUNGS: clear to auscultation bilaterally with normal breathing effort HEART: regular rate & Wesley and no murmurs and no lower extremity edema ABDOMEN:abdomen soft, non-tender and normal bowel sounds. No palpable hepatosplenomegaly Musculoskeletal:no cyanosis of digits and no clubbing  PSYCH: alert & oriented x 3 with fluent speech NEURO: no focal motor/sensory deficits RECTAL patient refused digital rectal exam  LABORATORY DATA:  I have reviewed the data as listed CBC Latest Ref Rng & Units 06/08/2018 03/06/2018 01/23/2018  WBC 4.0 - 10.3 K/uL 7.4 7.7 7.5  Hemoglobin 13.0 - 17.1 g/dL 15.7 14.7 14.8  Hematocrit 38.4 - 49.9 %  46.6 42.8 43.3  Platelets 140 - 400 K/uL 246 232 201   CMP Latest Ref Rng & Units 06/08/2018 03/06/2018 01/23/2018  Glucose 70 - 99 mg/dL 99 110 94  BUN 8 - 23 mg/dL _0 Creatinine 0.61 - 1.24 mg/dL 0.95 0.84 0.87  Sodium 135 - 145 mmol/L 139 139 140  Potassium 3.5 - 5.1 mmol/L 4.5 4.3 4.7  Chloride 98 - 111 mmol/L 105 107 104  CO2 22 - 32 mmol/L _1 Calcium 8.9 - 10.3 mg/dL 9.6 9.3 9.4  Total Protein 6.5 - 8.1 g/dL 7.1 6.7 6.8  Total Bilirubin 0.3 - 1.2 mg/dL 0.3 0.4 0.6  Alkaline Phos 38 - 126 U/L 72 72 76  AST 15 - 41 U/L _2 ALT 0 - 44 U/L _3 CEA:  10/20/17: 396 12/15/17: 434.60 12/22/17: 377.99 01/23/18: 54.08 03/06/18: 6.21 06/08/18: 3.21  RADIOGRAPHIC STUDIES: I have personally reviewed the radiological images as listed and agreed with the findings in the report. Ct Abdomen Pelvis W Contrast  Result Date: 06/08/2018 CLINICAL DATA:  Rectal cancer diagnosed 11/18 with radiation therapy completed. EXAM: CT ABDOMEN AND PELVIS WITH CONTRAST TECHNIQUE: Multidetector CT imaging of the abdomen and pelvis was performed using the standard protocol following bolus administration of intravenous contrast. CONTRAST:  120m ISOVUE-300 IOPAMIDOL (ISOVUE-300) INJECTION 61% COMPARISON:  10/21/2017 FINDINGS: Lower chest: Bibasilar atelectasis with probable scarring at the right lung base. Normal heart size without pericardial or pleural effusion. Hepatobiliary: Normal liver. Cholecystectomy, without biliary ductal dilatation. Pancreas: Normal, without mass or ductal dilatation. Spleen: Normal in size, without focal abnormality. Adrenals/Urinary Tract: Normal right adrenal gland. Minimal left adrenal thickening. Upper/interpolar right renal cyst laterally. Normal left kidney, without hydronephrosis. The bladder is suboptimally distended, but appears mildly thick walled including on 76/2. Stomach/Bowel: Portions of the proximal stomach and gastric antrum are underdistended. The rectal  primary is no longer identified. No complicating colitis or proctitis. Normal terminal ileum and appendix. Normal small bowel. Vascular/Lymphatic: Left-sided IVC. Advanced aortic and branch vessel atherosclerosis. No retroperitoneal or retrocrural  adenopathy. Resolution of previously described right perirectal adenopathy. Reproductive: Normal prostate. Other: No significant free fluid. No evidence of omental or peritoneal disease. Musculoskeletal: No acute osseous abnormality. Convex right lumbar spine curvature. IMPRESSION: 1. Response to therapy of rectal primary and perirectal adenopathy. 2. No new or progressive disease identified. 3. Bladder underdistention.  Cannot exclude concurrent cystitis. 4.  Aortic Atherosclerosis (ICD10-I70.0). 5. Left-sided IVC. Electronically Signed   By: Abigail Miyamoto M.D.   On: 06/08/2018 15:49     CT Chest WO Contrast 11/21/17 IMPRESSION: 1. No evidence of thoracic metastatic disease. 2. Moderate centrilobular emphysema. 3. Mild coronary artery and Aortic Atherosclerosis (ICD10-I70.0).  CT AP W Contrast 10/21/17 IMPRESSION: 1. Irregular mural and mucosal thickening of the rectum with small right 9 mm short axis index lymph node adjacent to the rectum. 2. Diffuse mild to moderate fluid-filled distention of small bowel loops query ileus or dysmotility. No mechanical source obstruction is seen. 3. Submucosal fatty infiltration of the descending and sigmoid colon possibly representing stigmata of chronic inflammatory bowel disease.  ASSESSMENT & PLAN:  Wesley [name] is a 72 y.o. caucasian male with history of anxiety, depression, tobacco use presenting with painless rectal bleeding, abdominal pain, nausea, diarrhea, fatigue, and 25 lbs weight loss over 1 year.   1. Rectal adenocarcinoma, uT3uN0M0, stag IIA -He has repeatedly declined the option of surgery -He completed chemoRT with Xeloda 1500 mg BID and radiation therapy with Dr. Lisbeth Renshaw 12/15/17-01/26/18. Due  to his fatigue, I reduced his Xeloda dose.  -We again discussed the possibility of cure with chemoradiation alone is very small, he will likely have cancer recurrence. -We discussed his CT AP from 06/08/18 which showed good response to treatment, with no new or progressive disease seen.  Based on the CT scan, I am not able to tell him if he has had a complete response.  We discussed the role of endoscopy with biopsy to confirm if he has residual disease.  However he is not interested in surgery, so the endoscopy will not change the management of his cancer, and I do not think he would need it.  -Patient is agreeable for continued surveillance, we discussed the option of chemotherapy with recurrent disease. -I strongly encouraged him to take stool softener/laxative to manage his severe constipation.  -Labs reviewed, CBC and CMP and CEA WNL which indicates very good response to treatment.  -He declined rectal exam today -I encouraged him to contact me if he develops new significant pain, change in bowel habits or no bowel movement or weight loss.  -F/u in 3 months   2. Constipation, Bowel incontinence, Weight loss  -He presented with intermittent nausea, diarrhea, and 25 pounds weight loss over 1 year and previously prescribed him lomotil and Zofran for management. He can also use Imodium as needed.  -I previously encouraged pt to consider PT for his pelvic muscles so he can regain some strength and control in that area. He declined at this time -After treatment his diarrhea resolved and he developed constipation. He has 1 BM a week. I recommend he take Colase BID and if not working he can use Miralax or Senakot.  -His weight is slightly trending down lately but mostly stable.   3. Smoking cessation -Patient has expressed interest in quitting smoking. He smokes 1 pack/day on average but He indicates that he is not ready to quit yet. -He has previously been given written information about smoking  cessation support groups and the telephone number to the quit line. -  Prescription for Nicoderm patches has previously been sent to his pharmacy. He is aware that he may start these when he feels ready to quit smoking.  PLAN: -lab and scan reviewed  -Lab and f/u in 3 months    All questions were answered. The patient knows to call the clinic with any problems, questions or concerns. I spent 20 minutes counseling the patient face to face. The total time spent in the appointment was 25 minutes and more than 50% was on counseling, review of test results, and coordination of care.  Oneal Deputy, am acting as scribe for Truitt Merle, MD.   I have reviewed the above documentation for accuracy and completeness, and I agree with the above.     Truitt Merle  06/12/2018

## 2018-06-12 ENCOUNTER — Encounter: Payer: Self-pay | Admitting: Hematology

## 2018-06-12 ENCOUNTER — Telehealth: Payer: Self-pay | Admitting: Hematology

## 2018-06-12 ENCOUNTER — Inpatient Hospital Stay (HOSPITAL_BASED_OUTPATIENT_CLINIC_OR_DEPARTMENT_OTHER): Payer: Medicare HMO | Admitting: Hematology

## 2018-06-12 VITALS — BP 131/70 | HR 78 | Temp 97.8°F | Resp 18 | Ht 66.0 in | Wt 135.0 lb

## 2018-06-12 DIAGNOSIS — R69 Illness, unspecified: Secondary | ICD-10-CM | POA: Diagnosis not present

## 2018-06-12 DIAGNOSIS — Z79899 Other long term (current) drug therapy: Secondary | ICD-10-CM

## 2018-06-12 DIAGNOSIS — F17210 Nicotine dependence, cigarettes, uncomplicated: Secondary | ICD-10-CM

## 2018-06-12 DIAGNOSIS — R634 Abnormal weight loss: Secondary | ICD-10-CM | POA: Diagnosis not present

## 2018-06-12 DIAGNOSIS — K5900 Constipation, unspecified: Secondary | ICD-10-CM | POA: Diagnosis not present

## 2018-06-12 DIAGNOSIS — R159 Full incontinence of feces: Secondary | ICD-10-CM | POA: Diagnosis not present

## 2018-06-12 DIAGNOSIS — Z923 Personal history of irradiation: Secondary | ICD-10-CM | POA: Diagnosis not present

## 2018-06-12 DIAGNOSIS — C20 Malignant neoplasm of rectum: Secondary | ICD-10-CM | POA: Diagnosis not present

## 2018-06-12 DIAGNOSIS — Z9221 Personal history of antineoplastic chemotherapy: Secondary | ICD-10-CM

## 2018-06-12 NOTE — Telephone Encounter (Signed)
Gave pt avs and calendar with appts per 7/12 los.

## 2018-06-19 DIAGNOSIS — H524 Presbyopia: Secondary | ICD-10-CM | POA: Diagnosis not present

## 2018-06-19 DIAGNOSIS — H25013 Cortical age-related cataract, bilateral: Secondary | ICD-10-CM | POA: Diagnosis not present

## 2018-07-14 ENCOUNTER — Other Ambulatory Visit: Payer: Self-pay

## 2018-07-14 ENCOUNTER — Encounter (HOSPITAL_COMMUNITY): Payer: Self-pay | Admitting: Emergency Medicine

## 2018-07-14 ENCOUNTER — Emergency Department (HOSPITAL_COMMUNITY)
Admission: EM | Admit: 2018-07-14 | Discharge: 2018-07-14 | Disposition: A | Discharge status: Home / Self Care | Payer: Medicare HMO | Attending: Emergency Medicine | Admitting: Emergency Medicine

## 2018-07-14 ENCOUNTER — Emergency Department (HOSPITAL_COMMUNITY): Payer: Medicare HMO

## 2018-07-14 DIAGNOSIS — R072 Precordial pain: Secondary | ICD-10-CM | POA: Insufficient documentation

## 2018-07-14 DIAGNOSIS — F17210 Nicotine dependence, cigarettes, uncomplicated: Secondary | ICD-10-CM | POA: Insufficient documentation

## 2018-07-14 DIAGNOSIS — R079 Chest pain, unspecified: Secondary | ICD-10-CM | POA: Diagnosis not present

## 2018-07-14 DIAGNOSIS — R69 Illness, unspecified: Secondary | ICD-10-CM | POA: Diagnosis not present

## 2018-07-14 DIAGNOSIS — C20 Malignant neoplasm of rectum: Secondary | ICD-10-CM | POA: Diagnosis not present

## 2018-07-14 LAB — BASIC METABOLIC PANEL
Anion gap: 5 (ref 5–15)
BUN: 9 mg/dL (ref 8–23)
CHLORIDE: 103 mmol/L (ref 98–111)
CO2: 29 mmol/L (ref 22–32)
CREATININE: 0.78 mg/dL (ref 0.61–1.24)
Calcium: 8.8 mg/dL — ABNORMAL LOW (ref 8.9–10.3)
GFR calc non Af Amer: >60 mL/min (ref >60)
Glucose, Bld: 119 mg/dL — ABNORMAL HIGH (ref 70–99)
Potassium: 3.1 mmol/L — ABNORMAL LOW (ref 3.5–5.1)
Sodium: 137 mmol/L (ref 135–145)

## 2018-07-14 LAB — CBC
HCT: 44.4 % (ref 39.0–52.0)
Hemoglobin: 15.5 g/dL (ref 13.0–17.0)
MCH: 31.1 pg (ref 26.0–34.0)
MCHC: 34.9 g/dL (ref 30.0–36.0)
MCV: 89.2 fL (ref 78.0–100.0)
PLATELETS: 250 10*3/uL (ref 150–400)
RBC: 4.98 MIL/uL (ref 4.22–5.81)
RDW: 13.6 % (ref 11.5–15.5)
WBC: 7.4 10*3/uL (ref 4.0–10.5)

## 2018-07-14 LAB — I-STAT TROPONIN, ED
Troponin i, poc: 0 ng/mL (ref 0.00–0.08)
Troponin i, poc: 0 ng/mL (ref 0.00–0.08)

## 2018-07-14 MED ORDER — ASPIRIN 81 MG PO CHEW
324.0000 mg | CHEWABLE_TABLET | Freq: Once | ORAL | Status: AC
  Administered 2018-07-14: 324 mg via ORAL
  Filled 2018-07-14: qty 4

## 2018-07-14 NOTE — Discharge Instructions (Addendum)
Today's work-up for the chest pain that has been resolved since 6:00 this morning without any lab abnormalities and a normal chest x-ray the heart marker troponin was done twice both negative.  Return for any new or recurrent chest pain lasting 15 minutes or longer.  Make an appointment to follow-up with cardiology, information provided above.  Continue your follow-up with oncology.  Also return for development of any shortness of breath.

## 2018-07-14 NOTE — ED Notes (Signed)
Pt is not having any chest pain right now. States he is under a lot of stress and he thinks that's what has caused his chest pain previously.

## 2018-07-14 NOTE — ED Triage Notes (Signed)
Pt with c/o centralized chest pain since 4am.

## 2018-07-14 NOTE — ED Notes (Signed)
Pt questioning why he is waiting for so long. Have explained why troponin has to wait for 3 hours. Pt verbalized understanding.

## 2018-07-14 NOTE — ED Provider Notes (Signed)
[facility] EMERGENCY DEPARTMENT Provider Note   CSN: [id number] Arrival date & time: 07/14/18  9024     History   Chief Complaint Chief Complaint  Patient presents with  . Chest Pain    HPI Wesley [name] is a 72 y.o. male.  Patient brought himself to the emergency department with a concern of chest pain across the anterior part of the chest bilaterally that started at about 4:30 in the morning.  Patient states the pain completely resolved on its own at 6 in the morning.  Patient has a history of rectal cancer.  It was diagnosed in November.  He had radiation treatment and has had chemotherapy but has refused surgery.  Patient is followed by oncology.  Last seen by them in the spring.  Patient without any known history of any heart problems.  He is a smoker.  The chest pain was not associated with any shortness of breath or any nausea or vomiting or diaphoresis.     Past Medical History:  Diagnosis Date  . Cancer ([place]) 10/21/2017   rectal cancer  . 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 Rectal adenocarcinoma ([place]) 11/07/2017  . Depression 01/04/2015  . Hyperlipidemia 11/16/2014  . Tobacco use 11/16/2014  . Insomnia 11/16/2014    Past Surgical History:  Procedure Laterality Date  . CHOLECYSTECTOMY    . EUS N/A 10/31/2017   Procedure: LOWER ENDOSCOPIC ULTRASOUND (EUS);  Surgeon: Carol Ada, MD;  Location: [facility];  Service: Endoscopy;  Laterality: N/A;        Home Medications    Prior to Admission medications   Not on File    Family History Family History  Problem Relation Age of Onset  . Cancer Mother        Kidney  . Diabetes Mother   . Cancer Paternal Uncle        colon  . Cancer Paternal Aunt 59       breast cancer  . Hyperlipidemia Neg Hx   . Hypertension Neg Hx   . Stroke Neg Hx     Social History Social History   Tobacco Use  . Smoking status: Current Every Day Smoker      Packs/day: 1.00    Years: 56.00    Pack years: 56.00    Types: Cigarettes  . Smokeless tobacco: Never Used  Substance Use Topics  . Alcohol use: No    Alcohol/week: 0.0 standard drinks  . Drug use: No     Allergies   Patient has no known allergies.   Review of Systems Review of Systems  Constitutional: Negative for fever.  HENT: Negative for congestion.   Eyes: Negative for redness.  Respiratory: Negative for shortness of breath.   Cardiovascular: Positive for chest pain. Negative for palpitations and leg swelling.  Gastrointestinal: Negative for abdominal pain.  Genitourinary: Negative for dysuria.  Musculoskeletal: Negative for back pain.  Skin: Negative for rash.  Neurological: Negative for headaches.  Hematological: Does not bruise/bleed easily.  Psychiatric/Behavioral: Negative for confusion.     Physical Exam Updated Vital Signs BP 122/65   Pulse 64   Temp 97.9 F (36.6 C) (Oral)   Resp 18   Ht 1.676 m (5\' 6" )   Wt 59 kg   SpO2 96%   BMI 20.98 kg/m   Physical Exam  Constitutional: He is oriented to person, place, and time. He appears well-developed and well-nourished. No distress.  HENT:  Head: Normocephalic and atraumatic.  Mouth/Throat: Oropharynx is clear and moist.  Eyes: Pupils are equal, round, and reactive to light. Conjunctivae and EOM are normal.  Neck: Normal range of motion. Neck supple.  Cardiovascular: Normal rate, regular rhythm and normal heart sounds.  Pulmonary/Chest: Effort normal and breath sounds normal. No respiratory distress. He has no wheezes.  Abdominal: Soft. Bowel sounds are normal. There is no tenderness.  Musculoskeletal: Normal range of motion. He exhibits no edema.  Neurological: He is alert and oriented to person, place, and time. No cranial nerve deficit or sensory deficit. He exhibits normal muscle tone. Coordination normal.  Skin: Skin is warm.  Nursing note and vitals reviewed.    ED Treatments / Results   Labs (all labs ordered are listed, but only abnormal results are displayed) Labs Reviewed  BASIC METABOLIC PANEL - Abnormal; Notable for the following components:      Result Value   Potassium 3.1 (*)    Glucose, Bld 119 (*)    Calcium 8.8 (*)    All other components within normal limits  CBC  I-STAT TROPONIN, ED  I-STAT TROPONIN, ED    EKG EKG Interpretation  Date/Time:  Tuesday July 14 2018 06:34:37 EDT Ventricular Rate:  81 PR Interval:    QRS Duration: 94 QT Interval:  400 QTC Calculation: 465 R Axis:   43 Text Interpretation:  Sinus rhythm Interpretation limited secondary to artifact Confirmed by Fredia Sorrow [phone]) on 07/14/2018 7:13:26 AM   Radiology Dg Chest 2 View  Result Date: 07/14/2018 CLINICAL DATA:  Chest pain.  History of rectal cancer. EXAM: CHEST - 2 VIEW COMPARISON:  CT 11/21/2017. FINDINGS: Mediastinum hilar structures normal. COPD. Solitary symmetric nodular densities noted over the lung bases on PA view only. No pulmonary nodules noted on lateral view. These appear to represent nipple shadows. Prominent nipple shadows are noted on lateral view on prior CT. No focal pulmonary infiltrate. No pleural effusion or pneumothorax. Heart size normal. No acute bony abnormality. IMPRESSION: COPD.  No acute cardiopulmonary disease. Electronically Signed   By: Marcello Moores  Register   On: 07/14/2018 07:11    Procedures Procedures (including critical care time)  Medications Ordered in ED Medications  aspirin chewable tablet 324 mg (324 mg Oral Given 07/14/18 0737)     Initial Impression / Assessment and Plan / ED Course  I have reviewed the triage vital signs and the nursing notes.  Pertinent labs & imaging results that were available during my care of the patient were reviewed by me and considered in my medical decision making (see chart for details).    Work-up for the chest pain this morning that started at 4:30 in the morning and had resolved by 6 in the  morning without any acute findings.  Chest x-ray negative for pneumonia, collapsed lung or fluid on the lung.  Troponin heart markers negative x2.  Last marker was done little more than 5 hours after the pain had resolved.  In addition did consider pulmonary embolus is a possibility since patient does have a known history of rectal cancer.  However patient without any tachycardia no hypoxia no shortness of breath.  Clinically feel that pulmonary embolus is not likely.  Also has no leg swelling.  Lab patient follow-up with cardiology for the chest pain.  Also recommended patient follow back up with oncology.   Final Clinical Impressions(s) / ED Diagnoses   Final diagnoses:  Precordial pain    ED Discharge Orders    None  Fredia Sorrow, MD 07/14/18 1241

## 2018-07-14 NOTE — ED Notes (Signed)
Have given pt crackers and a coke. Pt is aware of wait time

## 2018-07-21 DIAGNOSIS — H2513 Age-related nuclear cataract, bilateral: Secondary | ICD-10-CM | POA: Diagnosis not present

## 2018-07-24 DIAGNOSIS — Z0100 Encounter for examination of eyes and vision without abnormal findings: Secondary | ICD-10-CM | POA: Diagnosis not present

## 2018-09-09 NOTE — Progress Notes (Signed)
[place]  [phone] [phone]  Clinic Follow Up Note   Patient Care Team: Jearld Fenton, NP as PCP - General (Internal Medicine) Carol Ada, MD as Consulting Physician (Gastroenterology) Truitt Merle, MD as Consulting Physician (Hematology) Alla Feeling, NP as Nurse Practitioner (Nurse Practitioner)   Date of Service:  09/11/2018   CHIEF COMPLAINTS:  Follow up rectal cancer    Oncology History   Cancer Staging Rectal adenocarcinoma [facility]) Staging form: Colon and Rectum, AJCC 8th Edition - Clinical stage from 10/31/2017: Stage IIA (cT3, cN0, cM0) - Signed by Truitt Merle, MD on 11/28/2017       Rectal adenocarcinoma ([place])   10/21/2017 Procedure    COLONOSCOPY per Dr. Carol Ada Findings: A fungating, infiltrative and ulcerated non--obstructing large mass was found in the rectum.  The mass was circumferential.  The mass measured 3 cm in length.  Oozing was present.  This was biopsied with a cold snare for histology.  2 sessile polyps were found in the transverse colon and ascending colon.  The polyps were 3-4 mm in size.  The polyps were removed with a cold snare.  Section and retrieval were complete.  A 15 mm polyp was found in the sigmoid colon.  The polyp was semi-pedunculated.  The polyp was removed with a hot snare.  Resection and retrieval were complete  The mass was extremely friable and palpable with a rectal examination.  The distal extent was 5 cm from the dentate line and it was 3 cm in length.  Specimens were obtained using a cold snare.  Retroflexion was not possible with the close proximity of the lesion in the rectum.       10/21/2017 Initial Biopsy    Final microscopic diagnosis A.  Colon, ascending, polyp: Tubular adenoma No high-grade dysplasia or malignancy  B.  Colon, transverse, polyp: Tubular adenoma No high-grade dysplasia or malignancy  C.  Colon, sigmoid, polyp: Tubular adenoma with high-grade  dysplasia and mucosal prolapse  D.  Rectum, mass, biopsy: Invasive adenocarcinoma, moderately differentiated Immunohistochemical stains for ML H1, [place] 2, [place] 6 and PMS 2 are intact (normal)  There is no evidence of DNA mismatch repair deficiency in the carcinoma, indicating that the tumor is most likely microsatellite stable, and that Lynch syndrome (a common cause of hereditary non-polyposis colorectal cancer or HNPCC) is very unlikely    10/21/2017 Imaging    CT A/P W CONTRAST IMPRESSION: 1. Irregular mural and mucosal thickening of the rectum with small right 9 mm short axis index lymph node adjacent to the rectum. 2. Diffuse mild to moderate fluid-filled distention of small bowel loops query ileus or dysmotility. No mechanical source obstruction is seen. 3. Submucosal fatty infiltration of the descending and sigmoid colon possibly representing stigmata of chronic inflammatory bowel disease.     10/21/2017 Initial Diagnosis    Rectal adenocarcinoma ([place])    10/31/2017 Procedure    EUS: Findings: A hypoechoic mass was found in the rectum. The mass was encountered at 5 cm (from the anal verge). The mass was partially circumferential (involving 50% of the lumen). The endosonographic borders were poorly-defined and irregular. The mass measured 30 mm (in maximum length) by 13 mm (in maximum thickness). There was sonographic evidence suggesting breakthrough of the muscularis propria with invasion into the perirectal fat (uT3, manifested by prominent pseudopodia). There was no sonographic evidence of invasion into the adjacent structures. Area was tattooed with an injection of 3 mL of Spot (carbon black)  both proximally and distally. I was not able to identify any significant lymph nodes. - Rectal mass was visualized endosonographically. A tissue diagnosis was obtained prior to this exam. This is of adenocarcinoma. This was staged uT3 uN0. Tattooed. - No specimens collected.     11/21/2017 Imaging    CT Chest 11/21/17  IMPRESSION: 1. No evidence of thoracic metastatic disease. 2. Moderate centrilobular emphysema. 3. Mild coronary artery and Aortic Atherosclerosis (ICD10-I70.0).    12/15/2017 - 01/26/2018 Chemotherapy    concurrent chemoradiation with Xeloda 1500 mg BID Monday - Friday. Dose reduced on 12/29/17 to 1500 mg in the AM and 1000 mg in PM    12/16/2017 - 01/26/2018 Radiation Therapy     concurrent chemoradiation at [facility]    06/08/2018 Imaging    CT AP W Contrast  IMPRESSION: 1. Response to therapy of rectal primary and perirectal adenopathy. 2. No new or progressive disease identified. 3. Bladder underdistention.  Cannot exclude concurrent cystitis. 4.  Aortic Atherosclerosis (ICD10-I70.0). 5. Left-sided IVC.    HISTORY OF PRESENTING ILLNESS:  Wesley [name] 72 y.o. male is here because of newly diagnosed rectal cancer. He reports intermittent painless rectal bleeding with associated abdominal pain, diarrhea, nausea, fatigue, and 25 pounds weight loss for 1 year. reported usual weight is 160 lbs. He attributed rectal bleeding to hemorrhoids and delayed seeking medical care because he is distrustful of the health care system. He does not have large appetite in general, eats 1 meal per day at baseline. Denies bloating, cramping, dysphagia, emesis, or early satiety. He presented to medical doctor who ordered colonoscopy which found a fungating, infiltrative, and ulcerated non-obstructing rectal mass, 2 sessile polyps in the transverse and ascending colon and 1 semi-pedunculated polyp in the sigmoid colon. The rectal mass was palpable on digital rectal exam. He then had CT abdomen and pelvis which showed irregular mural and mucosal thickening of the rectum with small right 9 mm short axis index lymph node adjacent to the rectum. EUS confirmed sonographic evidence suggesting breakthrough of the muscularis proprioa with invasion into the perirectal fat; there  was no evidence of invasion into adjacent structures; there were no identifiable significant lymph nodes.   In the past he has never had a colonoscopy. He has past medical history anxiety, depression, insomnia, hyperlipidemia, and tobacco use. He has smoked 1 PPD for 56 years. Denies alcohol or other drug use. He is widowed, lives alone, has 3 adult children who are healthy. One daughter and his sister accompany him to consult. This daughter lives approx 20 minutes away, sister lives 1 hour away. He drives dump truck. He is independent with ADL's.   Today he continues to report painless rectal bleeding, intermittent nausea and diarrhea. Imodium has not improved much. He is moderately fatigued but continues to work. He is willing to consider treatment but does not want to be a burden on his family.    CURRENT THERAPY: Surveillance  INTERVAL HISTORY  KAGEN KUNATH is here for a follow up for his rectal cancer. Since his last visit he presented to the ED at [facility] for chest pain on 07/14/18. Workup was negative and pain resolved.  He presents to the clinic today accompanied by his daughter. He notes having watery BM once a week but does not have a BM daily.  His daughter notes he fall off a truck on his buttocks while at the job. His pain is starting to resolve. He notes prior to the fall he had  tailbone pain for about 3 weeks. He has no trouble having BMs and he notes small spots of blood on his white shorts one time.  His daughter notes he has been anxious lately, especially when it comes to his medical bills.    REVIEW OF SYSTEMS:  Constitutional: Denies fevers, chills or abnormal night sweats (+) low appetite at baseline (+) stable weight   Eyes: Denies blurriness of vision, double vision or watery eyes Ears, nose, mouth, throat, and face: Denies mucositis or sore throat Respiratory: Denies dyspnea or wheezes (+) "smoker's cough" Cardiovascular: Denies palpitation, chest discomfort or  lower extremity swelling Gastrointestinal:  Denies abdominal pain, bloating  Skin: Denies abnormal skin rashes Lymphatics: Denies new lymphadenopathy or easy bruising Neurological:Denies numbness, tingling or new weaknesses  Behavioral/Psych: Mood is stable, no new changes (+) depression (+) anxiety All other systems were reviewed with the patient and are negative.   MEDICAL HISTORY:  Past Medical History:  Diagnosis Date  . Cancer ([place]) 10/21/2017   rectal cancer  . Rectal adenocarcinoma ([place]) 11/07/2017    SURGICAL HISTORY: Past Surgical History:  Procedure Laterality Date  . CHOLECYSTECTOMY    . EUS N/A 10/31/2017   Procedure: LOWER ENDOSCOPIC ULTRASOUND (EUS);  Surgeon: Carol Ada, MD;  Location: [facility];  Service: Endoscopy;  Laterality: N/A;    SOCIAL HISTORY: Social History   Socioeconomic History  . Marital status: Widowed    Spouse name: Not on file  . Number of children: 3  . Years of education: Not on file  . Highest education level: Not on file  Occupational History  . Occupation: truck Animator Needs  . Financial resource strain: Not on file  . Food insecurity:    Worry: Not on file    Inability: Not on file  . Transportation needs:    Medical: Not on file    Non-medical: Not on file  Tobacco Use  . Smoking status: Current Every Day Smoker    Packs/day: 1.00    Years: 56.00    Pack years: 56.00    Types: Cigarettes  . Smokeless tobacco: Never Used  Substance and Sexual Activity  . Alcohol use: No    Alcohol/week: 0.0 standard drinks  . Drug use: No  . Sexual activity: Not Currently  Lifestyle  . Physical activity:    Days per week: Not on file    Minutes per session: Not on file  . Stress: Not on file  Relationships  . Social connections:    Talks on phone: Not on file    Gets together: Not on file    Attends religious service: Not on file    Active member of club or organization: Not on file    Attends meetings of clubs or  organizations: Not on file    Relationship status: Not on file  . Intimate partner violence:    Fear of current or ex partner: Not on file    Emotionally abused: Not on file    Physically abused: Not on file    Forced sexual activity: Not on file  Other Topics Concern  . Not on file  Social History Narrative  . Not on file    FAMILY HISTORY: Family History  Problem Relation Age of Onset  . Cancer Mother        Kidney  . Diabetes Mother   . Cancer Paternal Uncle        colon  . Cancer Paternal Aunt 16  breast cancer  . Hyperlipidemia Neg Hx   . Hypertension Neg Hx   . Stroke Neg Hx     ALLERGIES:  has No Known Allergies.  MEDICATIONS:  Current Outpatient Medications  Medication Sig Dispense Refill  . ALPRAZolam (XANAX) 0.25 MG tablet Take 1 tablet (0.25 mg total) by mouth 2 (two) times daily as needed for anxiety. 40 tablet 0   No current facility-administered medications for this visit.     PHYSICAL EXAMINATION:  ECOG PERFORMANCE STATUS: 1 - Symptomatic but completely ambulatory  Vitals:   09/11/18 1307  BP: 115/74  Pulse: 76  Resp: 18  Temp: 98.4 F (36.9 C)  SpO2: 99%   Filed Weights   09/11/18 1307  Weight: 136 lb (61.7 kg)    GENERAL: alert, no distress and comfortable SKIN: skin color, texture, turgor are normal, no rashes or significant lesions EYES: normal, conjunctiva are pink and non-injected, sclera clear OROPHARYNX:no exudate, no erythema and lips, buccal mucosa, and tongue normal  NECK: supple, thyroid normal size, non-tender, without nodularity LYMPH:  no palpable cervical, supraclavicular, axillary, or inguinal lymphadenopathy  LUNGS: clear to auscultation bilaterally with normal breathing effort HEART: regular rate & rhythm and no murmurs and no lower extremity edema ABDOMEN: abdomen soft, non-tender and normal bowel sounds. No palpable hepatosplenomegaly Musculoskeletal:no cyanosis of digits and no clubbing  PSYCH: alert & oriented  x 3 with fluent speech NEURO: no focal motor/sensory deficits RECTAL EXAM: No skin breakdown or bruising on buttocks. No blood on glove, and no palpable mass. Due to his tenderness, I was only able to exam his distal rectum and anal canal.    LABORATORY DATA:  I have reviewed the data as listed CBC Latest Ref Rng & Units 09/11/2018 07/14/2018 06/08/2018  WBC 4.0 - 10.5 K/uL 7.0 7.4 7.4  Hemoglobin 13.0 - 17.0 g/dL 14.8 15.5 15.7  Hematocrit 39.0 - 52.0 % 43.4 44.4 46.6  Platelets 150 - 400 K/uL 249 250 246   CMP Latest Ref Rng & Units 09/11/2018 07/14/2018 06/08/2018  Glucose 70 - 99 mg/dL 98 119(H) 99  BUN 8 - 23 mg/dL 6(L) 9 12  Creatinine 0.61 - 1.24 mg/dL 0.81 0.78 0.95  Sodium 135 - 145 mmol/L 139 137 139  Potassium 3.5 - 5.1 mmol/L 3.9 3.1(L) 4.5  Chloride 98 - 111 mmol/L 103 103 105  CO2 22 - 32 mmol/L '27 29 27  '$ Calcium 8.9 - 10.3 mg/dL 9.1 8.8(L) 9.6  Total Protein 6.5 - 8.1 g/dL 7.0 - 7.1  Total Bilirubin 0.3 - 1.2 mg/dL 0.5 - 0.3  Alkaline Phos 38 - 126 U/L 77 - 72  AST 15 - 41 U/L 16 - 17  ALT 0 - 44 U/L 8 - 14   CEA:  10/20/17: 396 12/15/17: 434.60 12/22/17: 377.99 01/23/18: 54.08 03/06/18: 6.21 06/08/18: 3.21 09/11/18: PENDING   RADIOGRAPHIC STUDIES: I have personally reviewed the radiological images as listed and agreed with the findings in the report. No results found.   CT Chest WO Contrast 11/21/17 IMPRESSION: 1. No evidence of thoracic metastatic disease. 2. Moderate centrilobular emphysema. 3. Mild coronary artery and Aortic Atherosclerosis (ICD10-I70.0).  CT AP W Contrast 10/21/17 IMPRESSION: 1. Irregular mural and mucosal thickening of the rectum with small right 9 mm short axis index lymph node adjacent to the rectum. 2. Diffuse mild to moderate fluid-filled distention of small bowel loops query ileus or dysmotility. No mechanical source obstruction is seen. 3. Submucosal fatty infiltration of the descending and sigmoid colon possibly representing  stigmata of chronic inflammatory bowel disease.  ASSESSMENT & PLAN:  Wesley [name] is a 72 y.o. caucasian male with history of anxiety, depression, tobacco use presenting with painless rectal bleeding, abdominal pain, nausea, diarrhea, fatigue, and 25 lbs weight loss over 1 year.   1. Rectal adenocarcinoma, uT3uN0M0, stag IIA -He has repeatedly declined the option of surgery -He completed chemoRT with Xeloda 1500 mg BID and radiation therapy with Dr. Lisbeth Renshaw 12/15/17-01/26/18. Due to his fatigue, I reduced his Xeloda dose.  -We previously discussed the possibility of cure with chemoradiation alone is very small, he will likely residual cancer or cancer recurrence. -We previously discussed his CT AP from 06/08/18 which showed good response to treatment, with no new or progressive disease seen.  Based on the CT scan, I am not able to tell him if he has had a complete response. We discussed the role of endoscopy with biopsy to confirm if he has residual disease. However he is not interested in surgery or further treatment, so the endoscopy will not change the management of his cancer.  -Pt has developed some pain in sacrum, change of his bowel habits (pensil like stool), and a few episodes of mild rectal bleeding.  This is suspicious for residual cancer or recurrence, I recommend him to have a sigmoidoscopy by Dr. Benson Norway, he declined.  -I strongly encouraged him to take stool softener/laxative to manage his constipation.  -Today's rectal exam was limited to due rectal pain. Exam showed no skin breakdown or bruising on buttocks, no palpable mass on distal rectum.  -Patient is not interested in surgery or any additional treatment even if he has cancer.  -Labs reviewed, CBC WNL. CMP and CEA is still pending.   -I encouraged him to contact me if he develops new significant pain, change in bowel habits or no bowel movement or weight loss.  -F/u in 3 months with lab   2. Constipation, Bowel incontinence, Weight  loss  -He presented with intermittent nausea, diarrhea, and 25 pounds weight loss over 1 year and previously prescribed him lomotil and Zofran for management. He can also use Imodium as needed.  -I previously encouraged pt to consider PT for his pelvic muscles so he can regain some strength and control in that area. He declined at this time -After treatment his diarrhea resolved and he developed constipation. He has 1 BM a week. I recommend he take Colase BID and if not working he can use Miralax or Senakot.  -I again encouraged him to drink plenty of water and stool softeners to avoid constipation -Weight is now stable.    3. Smoking cessation  -Patient has expressed interest in quitting smoking. He smokes 1 pack/day on average but He indicates that he is not ready to quit yet. -He has previously been given written information about smoking cessation support groups and the telephone number to the quit line. -Prescription for Nicoderm patches has previously been sent to his pharmacy. He is aware that he may start these when he feels ready to quit smoking.  4. Anxiety -He has been anxious about bills and overwhelmed.  -He has been on medication for this in the past. I will prescribe him low dose XANAX to take at night to also help him sleep. If he feels anxious during the day he can take 1 dose. He can overall titrate up to 3 times daily as needed. 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   PLAN: -I prescribed low dose  Xanax today for his anxiety  -Lab and f/u in 3 months    All questions were answered. The patient knows to call the clinic with any problems, questions or concerns. I spent 20 minutes counseling the patient face to face. The total time spent in the appointment was 25 minutes and more than 50% was on counseling, review of test results, and coordination of care.  Oneal Deputy, am acting as scribe for Truitt Merle, MD.   I have reviewed the above  documentation for accuracy and completeness, and I agree with the above.      Truitt Merle  09/11/2018

## 2018-09-11 ENCOUNTER — Encounter: Payer: Self-pay | Admitting: Hematology

## 2018-09-11 ENCOUNTER — Telehealth: Payer: Self-pay | Admitting: Hematology

## 2018-09-11 ENCOUNTER — Inpatient Hospital Stay: Payer: Medicare HMO

## 2018-09-11 ENCOUNTER — Inpatient Hospital Stay: Payer: Medicare HMO | Attending: Hematology | Admitting: Hematology

## 2018-09-11 VITALS — BP 115/74 | HR 76 | Temp 98.4°F | Resp 18 | Ht 66.0 in | Wt 136.0 lb

## 2018-09-11 DIAGNOSIS — C20 Malignant neoplasm of rectum: Secondary | ICD-10-CM | POA: Diagnosis not present

## 2018-09-11 DIAGNOSIS — F419 Anxiety disorder, unspecified: Secondary | ICD-10-CM | POA: Diagnosis not present

## 2018-09-11 DIAGNOSIS — I700 Atherosclerosis of aorta: Secondary | ICD-10-CM

## 2018-09-11 DIAGNOSIS — Z79899 Other long term (current) drug therapy: Secondary | ICD-10-CM

## 2018-09-11 DIAGNOSIS — F329 Major depressive disorder, single episode, unspecified: Secondary | ICD-10-CM

## 2018-09-11 DIAGNOSIS — J432 Centrilobular emphysema: Secondary | ICD-10-CM | POA: Diagnosis not present

## 2018-09-11 DIAGNOSIS — F17210 Nicotine dependence, cigarettes, uncomplicated: Secondary | ICD-10-CM | POA: Insufficient documentation

## 2018-09-11 DIAGNOSIS — Z923 Personal history of irradiation: Secondary | ICD-10-CM | POA: Insufficient documentation

## 2018-09-11 DIAGNOSIS — R110 Nausea: Secondary | ICD-10-CM | POA: Diagnosis not present

## 2018-09-11 DIAGNOSIS — R69 Illness, unspecified: Secondary | ICD-10-CM | POA: Diagnosis not present

## 2018-09-11 DIAGNOSIS — Z9221 Personal history of antineoplastic chemotherapy: Secondary | ICD-10-CM

## 2018-09-11 DIAGNOSIS — R197 Diarrhea, unspecified: Secondary | ICD-10-CM | POA: Diagnosis not present

## 2018-09-11 LAB — CBC WITH DIFFERENTIAL/PLATELET
Abs Immature Granulocytes: 0.03 10*3/uL (ref 0.00–0.07)
BASOS PCT: 1 %
Basophils Absolute: 0.1 10*3/uL (ref 0.0–0.1)
EOS ABS: 0.2 10*3/uL (ref 0.0–0.5)
Eosinophils Relative: 3 %
HCT: 43.4 % (ref 39.0–52.0)
Hemoglobin: 14.8 g/dL (ref 13.0–17.0)
IMMATURE GRANULOCYTES: 0 %
Lymphocytes Relative: 16 %
Lymphs Abs: 1.1 10*3/uL (ref 0.7–4.0)
MCH: 30.1 pg (ref 26.0–34.0)
MCHC: 34.1 g/dL (ref 30.0–36.0)
MCV: 88.4 fL (ref 80.0–100.0)
MONO ABS: 0.8 10*3/uL (ref 0.1–1.0)
MONOS PCT: 11 %
NEUTROS PCT: 69 %
Neutro Abs: 4.9 10*3/uL (ref 1.7–7.7)
PLATELETS: 249 10*3/uL (ref 150–400)
RBC: 4.91 MIL/uL (ref 4.22–5.81)
RDW: 13.2 % (ref 11.5–15.5)
WBC: 7 10*3/uL (ref 4.0–10.5)
nRBC: 0 % (ref 0.0–0.2)

## 2018-09-11 LAB — COMPREHENSIVE METABOLIC PANEL
ALBUMIN: 3.4 g/dL — AB (ref 3.5–5.0)
ALK PHOS: 77 U/L (ref 38–126)
ALT: 8 U/L (ref 0–44)
AST: 16 U/L (ref 15–41)
Anion gap: 9 (ref 5–15)
BILIRUBIN TOTAL: 0.5 mg/dL (ref 0.3–1.2)
BUN: 6 mg/dL — ABNORMAL LOW (ref 8–23)
CALCIUM: 9.1 mg/dL (ref 8.9–10.3)
CO2: 27 mmol/L (ref 22–32)
Chloride: 103 mmol/L (ref 98–111)
Creatinine, Ser: 0.81 mg/dL (ref 0.61–1.24)
GFR calc non Af Amer: >60 mL/min (ref >60)
GLUCOSE: 98 mg/dL (ref 70–99)
Potassium: 3.9 mmol/L (ref 3.5–5.1)
Sodium: 139 mmol/L (ref 135–145)
TOTAL PROTEIN: 7 g/dL (ref 6.5–8.1)

## 2018-09-11 LAB — CEA (IN HOUSE-CHCC): CEA (CHCC-In House): 3.1 ng/mL (ref 0.00–5.00)

## 2018-09-11 MED ORDER — ALPRAZOLAM 0.25 MG PO TABS: 0.2500 mg | ORAL_TABLET | Freq: Two times a day (BID) | ORAL | 0 refills | Status: DC | PRN

## 2018-09-11 NOTE — Telephone Encounter (Signed)
Appts scheduled avs/calendar printed per 10/11 los

## 2018-09-14 ENCOUNTER — Telehealth: Payer: Self-pay | Admitting: Emergency Medicine

## 2018-09-14 NOTE — Telephone Encounter (Addendum)
Pt verbalized understanding   ----- Message from Alla Feeling, NP sent at 09/11/2018  4:19 PM EDT ----- Please let his know tumor marker is normal Thanks, Regan Rakers

## 2018-12-08 IMAGING — DX DG CHEST 2V
2 series · 2 of 2 positions shown · non-contrast
Comparison: CT 11/21/2017.

CLINICAL DATA: Chest pain.  History of rectal cancer.

EXAM:
CHEST - 2 VIEW

[chest pa]
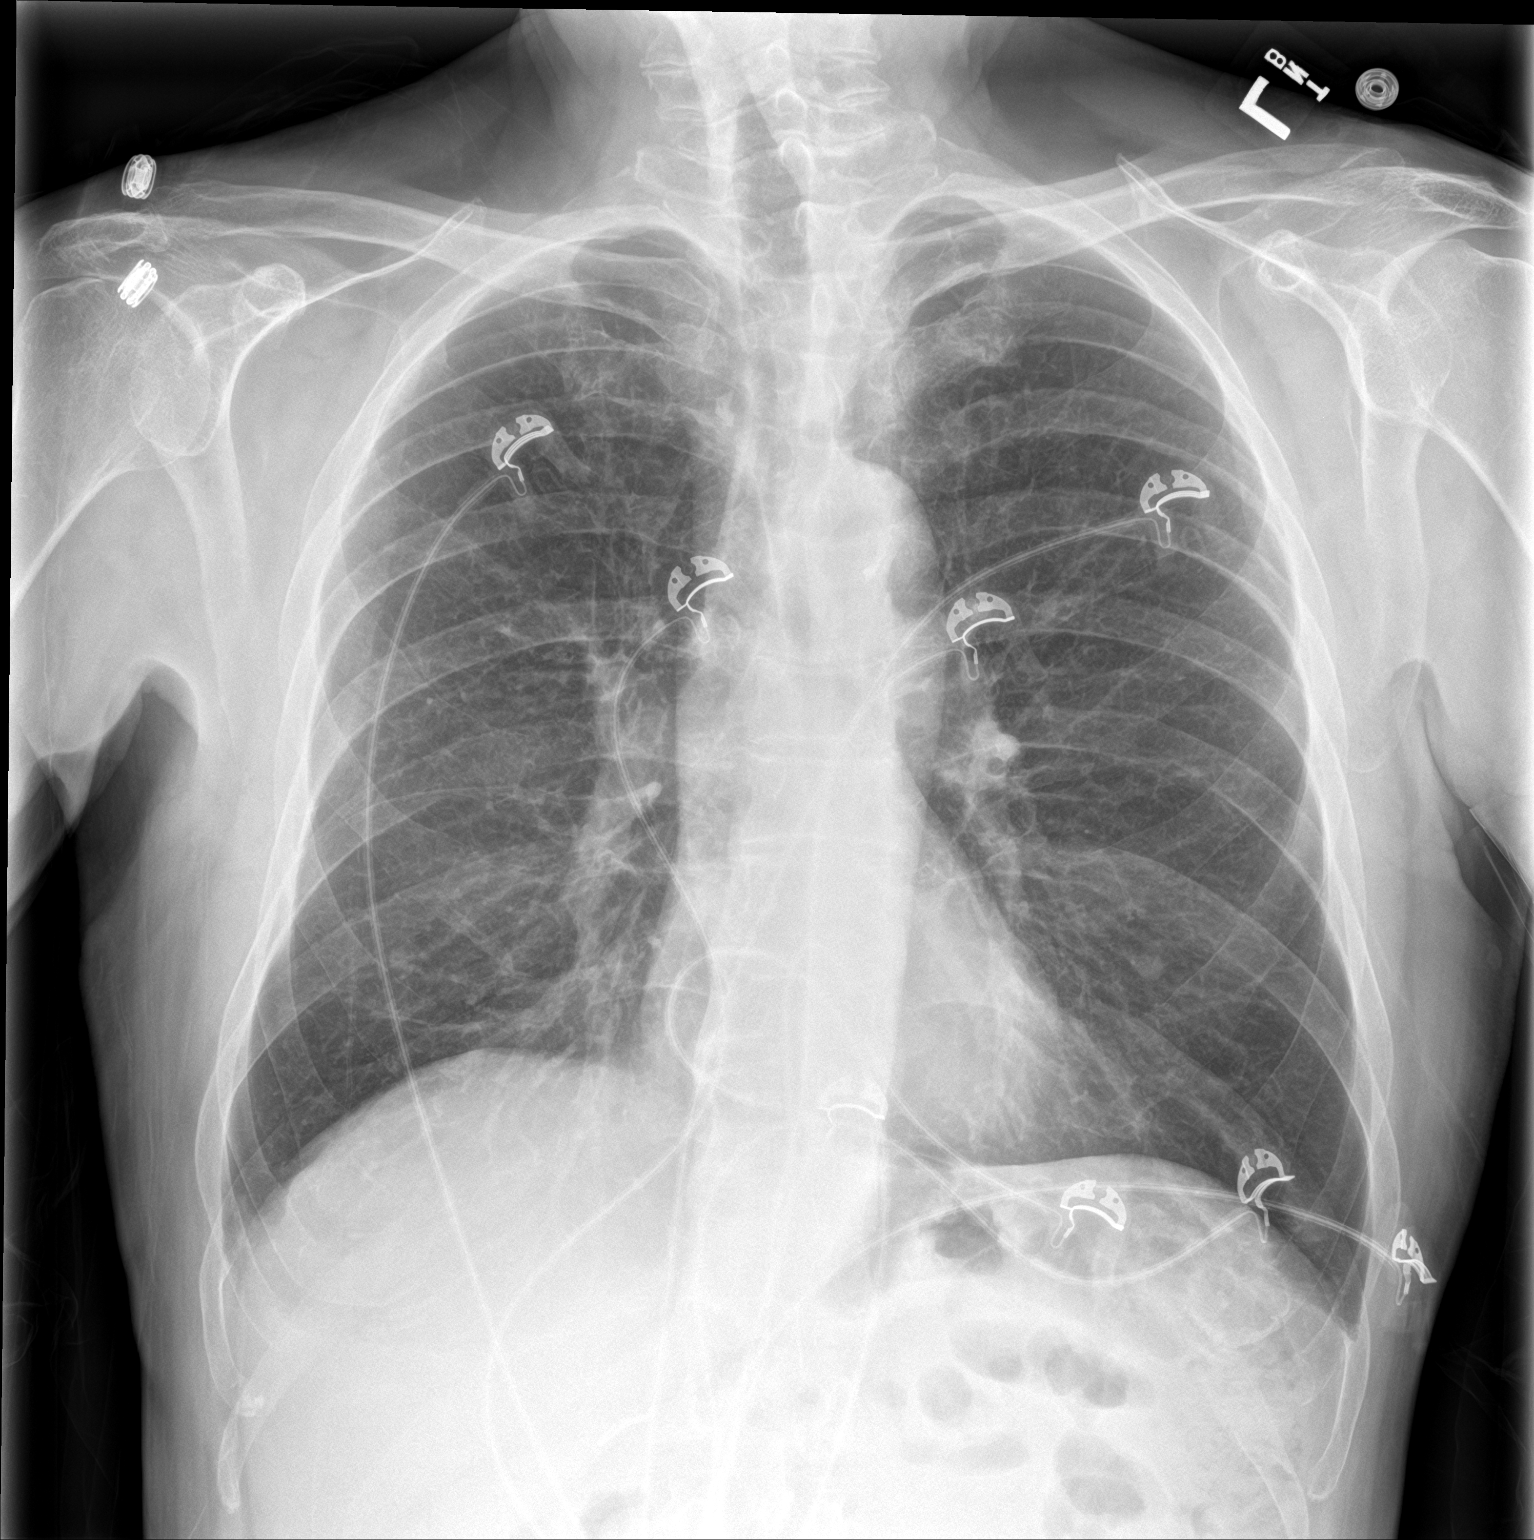

[chest lat]
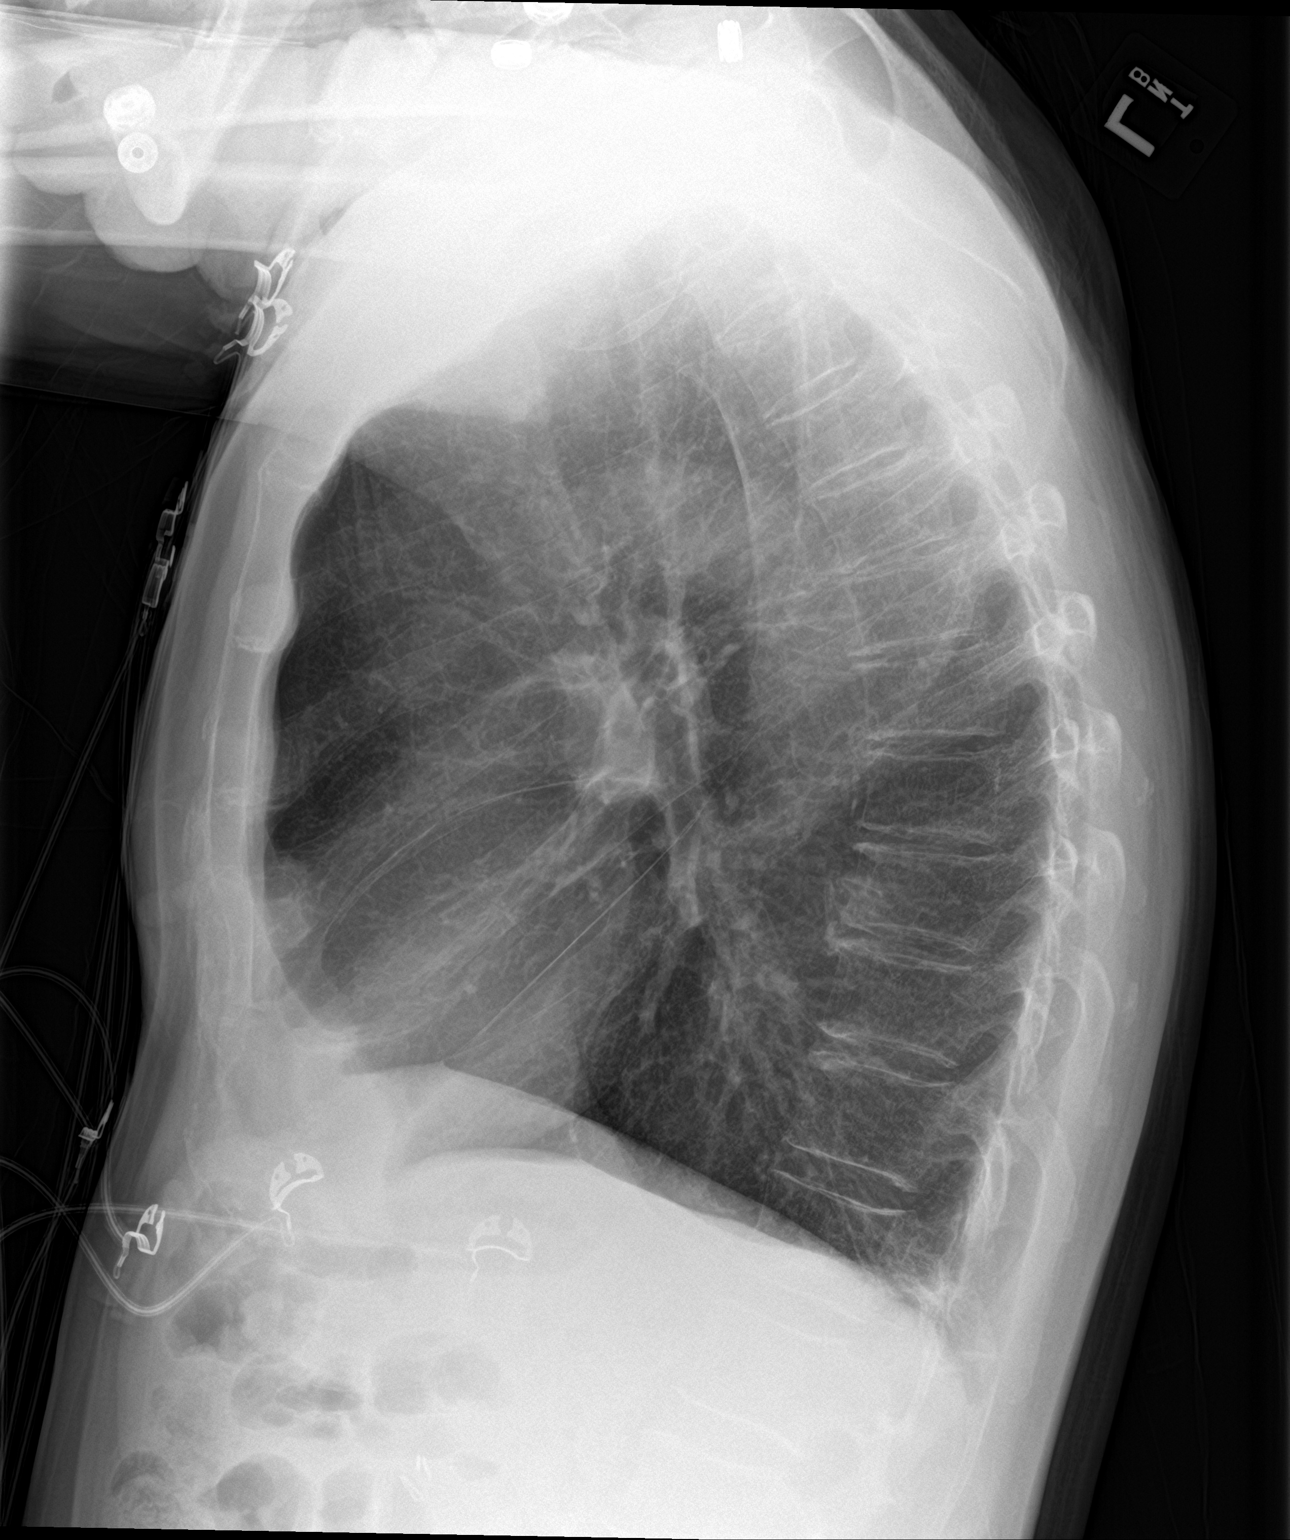

[2 of 2 positions shown; findings below may reference images not displayed]

FINDINGS: Mediastinum hilar structures normal. COPD. Solitary symmetric
nodular densities noted over the lung bases on PA view only. No
pulmonary nodules noted on lateral view. These appear to represent
nipple shadows. Prominent nipple shadows are noted on lateral view
on prior CT. No focal pulmonary infiltrate. No pleural effusion or
pneumothorax. Heart size normal. No acute bony abnormality.
IMPRESSION: COPD.  No acute cardiopulmonary disease.

## 2018-12-10 ENCOUNTER — Telehealth: Payer: Self-pay | Admitting: Hematology

## 2018-12-10 NOTE — Telephone Encounter (Signed)
Called patient per 1/9 sch message - unable to reach patient - left message for daughter to call back if still wanting to r/s

## 2018-12-11 ENCOUNTER — Inpatient Hospital Stay: Payer: Medicare HMO | Admitting: Hematology

## 2018-12-11 ENCOUNTER — Inpatient Hospital Stay: Payer: Medicare HMO | Attending: Hematology

## 2019-04-07 ENCOUNTER — Other Ambulatory Visit: Payer: Self-pay | Admitting: Hematology

## 2019-04-07 DIAGNOSIS — H903 Sensorineural hearing loss, bilateral: Secondary | ICD-10-CM | POA: Diagnosis not present

## 2019-04-07 DIAGNOSIS — Z01118 Encounter for examination of ears and hearing with other abnormal findings: Secondary | ICD-10-CM | POA: Diagnosis not present

## 2019-04-08 ENCOUNTER — Other Ambulatory Visit: Payer: Self-pay | Admitting: Hematology

## 2019-04-09 ENCOUNTER — Telehealth: Payer: Self-pay | Admitting: Hematology

## 2019-04-09 ENCOUNTER — Telehealth: Payer: Self-pay

## 2019-04-09 ENCOUNTER — Other Ambulatory Visit: Payer: Self-pay | Admitting: Hematology

## 2019-04-09 MED ORDER — ALPRAZOLAM 0.25 MG PO TABS: 0.2500 mg | ORAL_TABLET | Freq: Two times a day (BID) | ORAL | 0 refills | Status: DC | PRN

## 2019-04-09 NOTE — Telephone Encounter (Signed)
Spoke with patient's daughter Lattie Haw to check on patient's status, has not been seen since 09/2018.  She states he has been doing pretty good with the exception of not being able to sleep.  He had been taking the Xanax to help him sleep.  He is agreeable to come in for a follow up.  Spoke with Dr. Burr Medico will schedule him in two months.  She will refill his Xanax.  Let the daughter know.   Scheduling message has been sent.

## 2019-04-09 NOTE — Telephone Encounter (Signed)
Scheduled appt per sch msg. Called and spoke with patients daughter. Confirmed date and time

## 2019-06-03 NOTE — Progress Notes (Signed)
[place]   [phone] [phone]   Clinic Follow up Note   Patient Care Team: Jearld Fenton, NP as PCP - General (Internal Medicine) Carol Ada, MD as Consulting Physician (Gastroenterology) Truitt Merle, MD as Consulting Physician (Hematology) Alla Feeling, NP as Nurse Practitioner (Nurse Practitioner)  Date of Service:  06/11/2019  CHIEF COMPLAINT: Follow up rectal cancer     SUMMARY OF ONCOLOGIC HISTORY: Oncology History Overview Note  Cancer Staging Rectal adenocarcinoma [facility] ([facility] Staging form: Colon and Rectum, AJCC 8th Edition - Clinical stage from 10/31/2017: Stage IIA (cT3, cN0, cM0) - Signed by Truitt Merle, MD on 11/28/2017     Rectal adenocarcinoma ([place])  10/21/2017 Procedure   COLONOSCOPY per Dr. Carol Ada Findings: A fungating, infiltrative and ulcerated non--obstructing large mass was found in the rectum.  The mass was circumferential.  The mass measured 3 cm in length.  Oozing was present.  This was biopsied with a cold snare for histology.  2 sessile polyps were found in the transverse colon and ascending colon.  The polyps were 3-4 mm in size.  The polyps were removed with a cold snare.  Section and retrieval were complete.  A 15 mm polyp was found in the sigmoid colon.  The polyp was semi-pedunculated.  The polyp was removed with a hot snare.  Resection and retrieval were complete  The mass was extremely friable and palpable with a rectal examination.  The distal extent was 5 cm from the dentate line and it was 3 cm in length.  Specimens were obtained using a cold snare.  Retroflexion was not possible with the close proximity of the lesion in the rectum.      10/21/2017 Initial Biopsy   Final microscopic diagnosis A.  Colon, ascending, polyp: Tubular adenoma No high-grade dysplasia or malignancy  B.  Colon, transverse, polyp: Tubular adenoma No high-grade dysplasia or malignancy  C.  Colon, sigmoid, polyp:  Tubular adenoma with high-grade dysplasia and mucosal prolapse  D.  Rectum, mass, biopsy: Invasive adenocarcinoma, moderately differentiated Immunohistochemical stains for ML H1, [place] 2, [place] 6 and PMS 2 are intact (normal)  There is no evidence of DNA mismatch repair deficiency in the carcinoma, indicating that the tumor is most likely microsatellite stable, and that Lynch syndrome (a common cause of hereditary non-polyposis colorectal cancer or HNPCC) is very unlikely   10/21/2017 Imaging   CT A/P W CONTRAST IMPRESSION: 1. Irregular mural and mucosal thickening of the rectum with small right 9 mm short axis index lymph node adjacent to the rectum. 2. Diffuse mild to moderate fluid-filled distention of small bowel loops query ileus or dysmotility. No mechanical source obstruction is seen. 3. Submucosal fatty infiltration of the descending and sigmoid colon possibly representing stigmata of chronic inflammatory bowel disease.    10/21/2017 Initial Diagnosis   Rectal adenocarcinoma ([place])   10/31/2017 Procedure   EUS: Findings: A hypoechoic mass was found in the rectum. The mass was encountered at 5 cm (from the anal verge). The mass was partially circumferential (involving 50% of the lumen). The endosonographic borders were poorly-defined and irregular. The mass measured 30 mm (in maximum length) by 13 mm (in maximum thickness). There was sonographic evidence suggesting breakthrough of the muscularis propria with invasion into the perirectal fat (uT3, manifested by prominent pseudopodia). There was no sonographic evidence of invasion into the adjacent structures. Area was tattooed with an injection of 3 mL of Spot (carbon black) both proximally and distally. I was not  able to identify any significant lymph nodes. - Rectal mass was visualized endosonographically. A tissue diagnosis was obtained prior to this exam. This is of adenocarcinoma. This was staged uT3 uN0. Tattooed. - No  specimens collected.   11/21/2017 Imaging   CT Chest 11/21/17  IMPRESSION: 1. No evidence of thoracic metastatic disease. 2. Moderate centrilobular emphysema. 3. Mild coronary artery and Aortic Atherosclerosis (ICD10-I70.0).   12/15/2017 - 01/26/2018 Chemotherapy   concurrent chemoradiation with Xeloda 1500 mg BID Monday - Friday. Dose reduced on 12/29/17 to 1500 mg in the AM and 1000 mg in PM   12/16/2017 - 01/26/2018 Radiation Therapy    concurrent chemoradiation at [facility]   06/08/2018 Imaging   CT AP W Contrast  IMPRESSION: 1. Response to therapy of rectal primary and perirectal adenopathy. 2. No new or progressive disease identified. 3. Bladder underdistention.  Cannot exclude concurrent cystitis. 4.  Aortic Atherosclerosis (ICD10-I70.0). 5. Left-sided IVC.      CURRENT THERAPY:  Surveillance  INTERVAL HISTORY:  Wesley [name] is here for a follow up rectal cancer. He presents to the clinic alone. I called his daughter to be included in visit. He notes his right hip is bothering him. It takes him all day to cut his yard due to this pain. He has had this pain for 2 month. He has not has this issue before. He notes he fell in the hall but notes this was not a hard fall. He is able to walk without cane. Pain is exacerbated with walking. He is interested in workup.   He is having trouble moving his bowel's it feels like he does not have control. He notes having stool incontinence even in public. He notes he still has pencil sized stool. He has not had a BM in 3 days. He notes having intermittent sharp lower abdominal pains with BMs. He notes occasional nausea with eating.   He also has skin rash on left upper forehead. This does not bother him.     REVIEW OF SYSTEMS:   Constitutional: Denies fevers, chills or abnormal weight loss (+) Insomnia  Eyes: Denies blurriness of vision Ears, nose, mouth, throat, and face: Denies mucositis or sore throat Respiratory: Denies cough,  dyspnea or wheezes Cardiovascular: Denies palpitation, chest discomfort or lower extremity swelling Gastrointestinal: (+) uncontrolled BMs/Stool incontinence (+) pencil shaped stool (+) Constipation (+) intermittent sharp abdominal pain with BMs (+) Occasional nausea with eating  Skin: (+) Skin rash on left upper forehead.  MSK: (+) Right hip pain Lymphatics: Denies new lymphadenopathy or easy bruising Neurological:Denies numbness, tingling or new weaknesses Behavioral/Psych: Mood is stable, no new changes  All other systems were reviewed with the patient and are negative.  MEDICAL HISTORY:  Past Medical History:  Diagnosis Date  . Cancer ([place]) 10/21/2017   rectal cancer  . Rectal adenocarcinoma ([place]) 11/07/2017    SURGICAL HISTORY: Past Surgical History:  Procedure Laterality Date  . CHOLECYSTECTOMY    . EUS N/A 10/31/2017   Procedure: LOWER ENDOSCOPIC ULTRASOUND (EUS);  Surgeon: Carol Ada, MD;  Location: [facility];  Service: Endoscopy;  Laterality: N/A;    I have reviewed the social history and family history with the patient and they are unchanged from previous note.  ALLERGIES:  has No Known Allergies.  MEDICATIONS:  Current Outpatient Medications  Medication Sig Dispense Refill  . ALPRAZolam (XANAX) 0.5 MG tablet Take 1 tablet (0.5 mg total) by mouth at bedtime as needed for anxiety. 30 tablet 1  . potassium chloride  SA (K-DUR) 20 MEQ tablet Take 1 tablet (20 mEq total) by mouth daily. 14 tablet 0   No current facility-administered medications for this visit.     PHYSICAL EXAMINATION: ECOG PERFORMANCE STATUS: 2 - Symptomatic, <50% confined to bed  Vitals:   06/11/19 0932  BP: 130/81  Pulse: 73  Resp: 18  Temp: 99.1 F (37.3 C)  SpO2: 97%   Filed Weights   06/11/19 0932  Weight: 141 lb 6.4 oz (64.1 kg)    GENERAL:alert, no distress and comfortable SKIN: skin color, texture, turgor are normal, no rashes or significant lesions EYES: normal,  Conjunctiva are pink and non-injected, sclera clear  NECK: supple, thyroid normal size, non-tender, without nodularity LYMPH:  no palpable lymphadenopathy in the cervical, axillary  LUNGS: clear to auscultation and percussion with normal breathing effort HEART: regular rate & rhythm and no murmurs and no lower extremity edema ABDOMEN:abdomen soft, non-tender and normal bowel sounds (+) Epigastric tenderness  Musculoskeletal:no cyanosis of digits and no clubbing  NEURO: alert & oriented x 3 with fluent speech, no focal motor/sensory deficits He declined rectal exam today   LABORATORY DATA:  I have reviewed the data as listed CBC Latest Ref Rng & Units 06/11/2019 09/11/2018 07/14/2018  WBC 4.0 - 10.5 K/uL 6.9 7.0 7.4  Hemoglobin 13.0 - 17.0 g/dL 15.1 14.8 15.5  Hematocrit 39.0 - 52.0 % 43.8 43.4 44.4  Platelets 150 - 400 K/uL 221 249 250     CMP Latest Ref Rng & Units 06/11/2019 09/11/2018 07/14/2018  Glucose 70 - 99 mg/dL 106(H) 98 119(H)  BUN 8 - 23 mg/dL 10 6(L) 9  Creatinine 0.61 - 1.24 mg/dL 1.01 0.81 0.78  Sodium 135 - 145 mmol/L 138 139 137  Potassium 3.5 - 5.1 mmol/L 3.3(L) 3.9 3.1(L)  Chloride 98 - 111 mmol/L 105 103 103  CO2 22 - 32 mmol/L '23 27 29  '$ Calcium 8.9 - 10.3 mg/dL 8.3(L) 9.1 8.8(L)  Total Protein 6.5 - 8.1 g/dL 6.8 7.0 -  Total Bilirubin 0.3 - 1.2 mg/dL 0.7 0.5 -  Alkaline Phos 38 - 126 U/L 71 77 -  AST 15 - 41 U/L 15 16 -  ALT 0 - 44 U/L 9 8 -      RADIOGRAPHIC STUDIES: I have personally reviewed the radiological images as listed and agreed with the findings in the report. No results found.   ASSESSMENT & PLAN:  Wesley [name] is a 73 y.o. male with   1. Rectal adenocarcinoma, uT3uN0M0, stag IIA -He has repeatedly declined the option of surgery -He completed chemoRT with Xeloda 1500 mg BID and radiation therapy with Dr. Lisbeth Renshaw 12/15/17-01/26/18. Due to his fatigue, I reduced his Xeloda dose.  -We previously discussed the possibility of cure with  chemoradiation alone is very small, he will likely residual cancer or cancer recurrence. -We previously discussed his CT AP from 06/08/18 which showed good response to treatment, with no new or progressive disease seen.  Based on the CT scan, I am not able to tell him if he has had a complete response.  -I recommended repeating endoscopy for monitoring residual disease. However he is not interested in surgery or further treatment and declined  -Since completing treatment he has had pencil like stool and constipation. This has persisted but his intermittent stool incontinence returned and more significant. He also has lower abdominal pain with bowel movement and nausea with eating. He also has right hip pain.  -He had epigastric pain on exam. He declined  Rectal exam today.  -I discussed the possibility his rectal cancer is still present and possibly progressed. I recommend CT CAP to further evaluate his cancer. He is interested.  -Labs reviewed, CBC and CMP WNL except K 3.3, BG 106, Ca 8.6. CEA is still pending.  -I will call in oral potassium and he will start OTC calcium pill. I also encouraged him to increase potassium in diet. He is agreeable.  -Phone visit after scan.   2. Constipation, Bowel incontinence, Weight loss  -He completed treated in 01/2018 and his incontinence resolved and only had constipation. His last visit was 9 months ago in 09/2018. In there last few months his constipation persists with significant bowel incontinence intermittently. When he does have BM his stool is pencil shaped.  -He also has sharp intermittent pain with bowel movements and nausea with eating.  -His last BM was 3 days ago.  -His weight has remained up.  -He declined rectal exam today. Will obtain CT scan to rule out bowel obstruction.   3. Right hip pain  -Has been present for 2 months. He recently had a fall, he denies injury, no concerns for fracture on exam  -This is only present with walking -Will  observe on CT scan.   4. Smoking cessation  -Patient has expressed interest in quitting smoking. He smokes 1 pack/day on average but He indicates that he is not ready to quit yet. -Hehas previously been givenwritten information about smoking cessation support groups and the telephone number to the quit line. -Prescription for Nicoderm patches has previously been sent to his pharmacy. He is aware that he may start these when he feels ready to quit smoking.  5. Anxiety, Insomnia  -He has been anxious about bills and overwhelmed.  -He has been on Xanax BID at night. But has ran out.   -He has been having worsening insomnia. He has tried OTC sleep aides and prescription sleep aides previously.  -I will increase Xanax to 0.5 mg to take once nightly.   6. Hypokalemia, Hypocalcemia -I called in oral potassium today (06/11/19). I instructed him to start OTC calcium.   PLAN: -I refilled increase Xanax to 0.5 mg to take once nightly. I also called in oral potassium today  -CT CAP W contrast in 1-2 weeks  -Phone/Virtual visit after scan    No problem-specific Assessment & Plan notes found for this encounter.   Orders Placed This Encounter  Procedures  . CT Abdomen Pelvis W Contrast    Standing Status:   Future    Standing Expiration Date:   06/10/2020    Order Specific Question:   If indicated for the ordered procedure, I authorize the administration of contrast media per Radiology protocol    Answer:   Yes    Order Specific Question:   Preferred imaging location?    Answer:   [facility]    Order Specific Question:   Is Oral Contrast requested for this exam?    Answer:   Yes, Per Radiology protocol    Order Specific Question:   Radiology Contrast Protocol - do NOT remove file path    Answer:   \\[email or web address]  . CT Chest W Contrast    Standing Status:   Future    Standing Expiration Date:   06/10/2020    Order Specific Question:   If indicated  for the ordered procedure, I authorize the administration of contrast media per Radiology protocol    Answer:   Yes  Order Specific Question:   Preferred imaging location?    Answer:   [facility]    Order Specific Question:   Radiology Contrast Protocol - do NOT remove file path    Answer:   [email or web address]   All questions were answered. The patient knows to call the clinic with any problems, questions or concerns. No barriers to learning was detected. I spent 20 minutes counseling the patient face to face. The total time spent in the appointment was 25 minutes and more than 50% was on counseling and review of test results     Truitt Merle, MD 06/11/2019   I, Joslyn Devon, am acting as scribe for Truitt Merle, MD.   I have reviewed the above documentation for accuracy and completeness, and I agree with the above.

## 2019-06-11 ENCOUNTER — Other Ambulatory Visit: Payer: Self-pay

## 2019-06-11 ENCOUNTER — Inpatient Hospital Stay: Payer: Medicare HMO

## 2019-06-11 ENCOUNTER — Encounter: Payer: Self-pay | Admitting: Hematology

## 2019-06-11 ENCOUNTER — Telehealth: Payer: Self-pay | Admitting: Hematology

## 2019-06-11 ENCOUNTER — Inpatient Hospital Stay: Payer: Medicare HMO | Attending: Hematology | Admitting: Hematology

## 2019-06-11 VITALS — BP 130/81 | HR 73 | Temp 99.1°F | Resp 18 | Ht 66.0 in | Wt 141.4 lb

## 2019-06-11 DIAGNOSIS — E876 Hypokalemia: Secondary | ICD-10-CM | POA: Diagnosis not present

## 2019-06-11 DIAGNOSIS — K5900 Constipation, unspecified: Secondary | ICD-10-CM | POA: Diagnosis not present

## 2019-06-11 DIAGNOSIS — Z9221 Personal history of antineoplastic chemotherapy: Secondary | ICD-10-CM | POA: Insufficient documentation

## 2019-06-11 DIAGNOSIS — R634 Abnormal weight loss: Secondary | ICD-10-CM

## 2019-06-11 DIAGNOSIS — C20 Malignant neoplasm of rectum: Secondary | ICD-10-CM | POA: Diagnosis not present

## 2019-06-11 DIAGNOSIS — M25551 Pain in right hip: Secondary | ICD-10-CM | POA: Diagnosis not present

## 2019-06-11 DIAGNOSIS — R110 Nausea: Secondary | ICD-10-CM | POA: Diagnosis not present

## 2019-06-11 DIAGNOSIS — R159 Full incontinence of feces: Secondary | ICD-10-CM

## 2019-06-11 DIAGNOSIS — Z923 Personal history of irradiation: Secondary | ICD-10-CM

## 2019-06-11 DIAGNOSIS — Z9181 History of falling: Secondary | ICD-10-CM | POA: Insufficient documentation

## 2019-06-11 DIAGNOSIS — Z79899 Other long term (current) drug therapy: Secondary | ICD-10-CM

## 2019-06-11 DIAGNOSIS — F17210 Nicotine dependence, cigarettes, uncomplicated: Secondary | ICD-10-CM | POA: Insufficient documentation

## 2019-06-11 DIAGNOSIS — F419 Anxiety disorder, unspecified: Secondary | ICD-10-CM

## 2019-06-11 DIAGNOSIS — I2510 Atherosclerotic heart disease of native coronary artery without angina pectoris: Secondary | ICD-10-CM | POA: Diagnosis not present

## 2019-06-11 DIAGNOSIS — G4700 Insomnia, unspecified: Secondary | ICD-10-CM | POA: Diagnosis not present

## 2019-06-11 DIAGNOSIS — I700 Atherosclerosis of aorta: Secondary | ICD-10-CM | POA: Diagnosis not present

## 2019-06-11 LAB — COMPREHENSIVE METABOLIC PANEL
ALT: 9 U/L (ref 0–44)
AST: 15 U/L (ref 15–41)
Albumin: 3.6 g/dL (ref 3.5–5.0)
Alkaline Phosphatase: 71 U/L (ref 38–126)
Anion gap: 10 (ref 5–15)
BUN: 10 mg/dL (ref 8–23)
CO2: 23 mmol/L (ref 22–32)
Calcium: 8.3 mg/dL — ABNORMAL LOW (ref 8.9–10.3)
Chloride: 105 mmol/L (ref 98–111)
Creatinine, Ser: 1.01 mg/dL (ref 0.61–1.24)
GFR calc Af Amer: >60 mL/min (ref >60)
GFR calc non Af Amer: >60 mL/min (ref >60)
Glucose, Bld: 106 mg/dL — ABNORMAL HIGH (ref 70–99)
Potassium: 3.3 mmol/L — ABNORMAL LOW (ref 3.5–5.1)
Sodium: 138 mmol/L (ref 135–145)
Total Bilirubin: 0.7 mg/dL (ref 0.3–1.2)
Total Protein: 6.8 g/dL (ref 6.5–8.1)

## 2019-06-11 LAB — CBC WITH DIFFERENTIAL/PLATELET
Abs Immature Granulocytes: 0.03 10*3/uL (ref 0.00–0.07)
Basophils Absolute: 0.1 10*3/uL (ref 0.0–0.1)
Basophils Relative: 1 %
Eosinophils Absolute: 0.2 10*3/uL (ref 0.0–0.5)
Eosinophils Relative: 3 %
HCT: 43.8 % (ref 39.0–52.0)
Hemoglobin: 15.1 g/dL (ref 13.0–17.0)
Immature Granulocytes: 0 %
Lymphocytes Relative: 20 %
Lymphs Abs: 1.3 10*3/uL (ref 0.7–4.0)
MCH: 29.8 pg (ref 26.0–34.0)
MCHC: 34.5 g/dL (ref 30.0–36.0)
MCV: 86.6 fL (ref 80.0–100.0)
Monocytes Absolute: 0.8 10*3/uL (ref 0.1–1.0)
Monocytes Relative: 11 %
Neutro Abs: 4.5 10*3/uL (ref 1.7–7.7)
Neutrophils Relative %: 65 %
Platelets: 221 10*3/uL (ref 150–400)
RBC: 5.06 MIL/uL (ref 4.22–5.81)
RDW: 13.1 % (ref 11.5–15.5)
WBC: 6.9 10*3/uL (ref 4.0–10.5)
nRBC: 0 % (ref 0.0–0.2)

## 2019-06-11 LAB — CEA (IN HOUSE-CHCC): CEA (CHCC-In House): 3.08 ng/mL (ref 0.00–5.00)

## 2019-06-11 MED ORDER — ALPRAZOLAM 0.5 MG PO TABS: 0.5000 mg | ORAL_TABLET | Freq: Every evening | ORAL | 1 refills | Status: DC | PRN

## 2019-06-11 MED ORDER — POTASSIUM CHLORIDE CRYS ER 20 MEQ PO TBCR: 20.0000 meq | EXTENDED_RELEASE_TABLET | Freq: Every day | ORAL | 0 refills | Status: DC

## 2019-06-11 NOTE — Telephone Encounter (Signed)
Scheduled appt per 7/10 los. Spoke with patient and he is aware of his appt date and time.  Gave him the number to central radiology also.

## 2019-06-21 ENCOUNTER — Telehealth: Payer: Self-pay

## 2019-06-21 NOTE — Telephone Encounter (Signed)
-----   Message from Alla Feeling, NP sent at 06/21/2019 10:34 AM EDT ----- Please let him know tumor marker remains normal, no concerns. Remind him of CT 7/24 and phone visit with Dr. Burr Medico next week.  Thanks, Regan Rakers

## 2019-06-21 NOTE — Telephone Encounter (Signed)
Spoke with patient regarding lab results.  Per Dr. Burr Medico tumor marker remains normal, no concerns.  Reminded him about his upcoming CT scan and phone visit with Dr. Burr Medico.  Patient verbalized an understanding.

## 2019-06-24 NOTE — Progress Notes (Signed)
[place]   [phone] [phone]   Clinic Follow up Note   Patient Care Team: Jearld Fenton, NP as PCP - General (Internal Medicine) Carol Ada, MD as Consulting Physician (Gastroenterology) Truitt Merle, MD as Consulting Physician (Hematology) Alla Feeling, NP as Nurse Practitioner (Nurse Practitioner)   I connected with Arnoldo Morale on 06/28/2019 at 11:30 AM EDT by telephone visit and verified that I am speaking with the correct person using two identifiers.  I discussed the limitations, risks, security and privacy concerns of performing an evaluation and management service by telephone and the availability of in person appointments. I also discussed with the patient that there may be a patient responsible charge related to this service. The patient expressed understanding and agreed to proceed.   Patient's location:  His home  Provider's location:  My Office   CHIEF COMPLAINT: Follow up rectal cancer  SUMMARY OF ONCOLOGIC HISTORY: Oncology History Overview Note  Cancer Staging Rectal adenocarcinoma [facility]) Staging form: Colon and Rectum, AJCC 8th Edition - Clinical stage from 10/31/2017: Stage IIA (cT3, cN0, cM0) - Signed by Truitt Merle, MD on 11/28/2017     Rectal adenocarcinoma ([place])  10/21/2017 Procedure   COLONOSCOPY per Dr. Carol Ada Findings: A fungating, infiltrative and ulcerated non--obstructing large mass was found in the rectum.  The mass was circumferential.  The mass measured 3 cm in length.  Oozing was present.  This was biopsied with a cold snare for histology.  2 sessile polyps were found in the transverse colon and ascending colon.  The polyps were 3-4 mm in size.  The polyps were removed with a cold snare.  Section and retrieval were complete.  A 15 mm polyp was found in the sigmoid colon.  The polyp was semi-pedunculated.  The polyp was removed with a hot snare.  Resection and retrieval were complete  The  mass was extremely friable and palpable with a rectal examination.  The distal extent was 5 cm from the dentate line and it was 3 cm in length.  Specimens were obtained using a cold snare.  Retroflexion was not possible with the close proximity of the lesion in the rectum.      10/21/2017 Initial Biopsy   Final microscopic diagnosis A.  Colon, ascending, polyp: Tubular adenoma No high-grade dysplasia or malignancy  B.  Colon, transverse, polyp: Tubular adenoma No high-grade dysplasia or malignancy  C.  Colon, sigmoid, polyp: Tubular adenoma with high-grade dysplasia and mucosal prolapse  D.  Rectum, mass, biopsy: Invasive adenocarcinoma, moderately differentiated Immunohistochemical stains for ML H1, [place] 2, [place] 6 and PMS 2 are intact (normal)  There is no evidence of DNA mismatch repair deficiency in the carcinoma, indicating that the tumor is most likely microsatellite stable, and that Lynch syndrome (a common cause of hereditary non-polyposis colorectal cancer or HNPCC) is very unlikely   10/21/2017 Imaging   CT A/P W CONTRAST IMPRESSION: 1. Irregular mural and mucosal thickening of the rectum with small right 9 mm short axis index lymph node adjacent to the rectum. 2. Diffuse mild to moderate fluid-filled distention of small bowel loops query ileus or dysmotility. No mechanical source obstruction is seen. 3. Submucosal fatty infiltration of the descending and sigmoid colon possibly representing stigmata of chronic inflammatory bowel disease.    10/21/2017 Initial Diagnosis   Rectal adenocarcinoma ([place])   10/31/2017 Procedure   EUS: Findings: A hypoechoic mass was found in the rectum. The mass was encountered at 5  cm (from the anal verge). The mass was partially circumferential (involving 50% of the lumen). The endosonographic borders were poorly-defined and irregular. The mass measured 30 mm (in maximum length) by 13 mm (in maximum thickness). There was sonographic  evidence suggesting breakthrough of the muscularis propria with invasion into the perirectal fat (uT3, manifested by prominent pseudopodia). There was no sonographic evidence of invasion into the adjacent structures. Area was tattooed with an injection of 3 mL of Spot (carbon black) both proximally and distally. I was not able to identify any significant lymph nodes. - Rectal mass was visualized endosonographically. A tissue diagnosis was obtained prior to this exam. This is of adenocarcinoma. This was staged uT3 uN0. Tattooed. - No specimens collected.   11/21/2017 Imaging   CT Chest 11/21/17  IMPRESSION: 1. No evidence of thoracic metastatic disease. 2. Moderate centrilobular emphysema. 3. Mild coronary artery and Aortic Atherosclerosis (ICD10-I70.0).   12/15/2017 - 01/26/2018 Chemotherapy   concurrent chemoradiation with Xeloda 1500 mg BID Monday - Friday. Dose reduced on 12/29/17 to 1500 mg in the AM and 1000 mg in PM   12/16/2017 - 01/26/2018 Radiation Therapy    concurrent chemoradiation at [facility]   06/08/2018 Imaging   CT AP W Contrast  IMPRESSION: 1. Response to therapy of rectal primary and perirectal adenopathy. 2. No new or progressive disease identified. 3. Bladder underdistention.  Cannot exclude concurrent cystitis. 4.  Aortic Atherosclerosis (ICD10-I70.0). 5. Left-sided IVC.   06/25/2019 Imaging   CT CAP W Contrast  IMPRESSION: 1. No acute process or evidence of metastatic disease in the chest, abdomen, or pelvis. 2. Redemonstration of apparent bladder wall thickening, at least partially felt to be due to underdistention. Cystitis (presumably radiation induced) cannot be excluded. 3. Coronary artery atherosclerosis. 4. Left IVC.   Aortic Atherosclerosis (ICD10-I70.0) and Emphysema (ICD10-J43.9).      CURRENT THERAPY:  Surveillance  INTERVAL HISTORY:  Wesley [name] is here for a follow up of rectal cancer.  He notes he is doing well. He strongly  encourages me to discuss his results with his daughter.    REVIEW OF SYSTEMS:   Constitutional: Denies fevers, chills or abnormal weight loss Eyes: Denies blurriness of vision Ears, nose, mouth, throat, and face: Denies mucositis or sore throat Respiratory: Denies cough, dyspnea or wheezes Cardiovascular: Denies palpitation, chest discomfort or lower extremity swelling Gastrointestinal:  Denies nausea, heartburn or change in bowel habits Skin: Denies abnormal skin rashes Lymphatics: Denies new lymphadenopathy or easy bruising Neurological:Denies numbness, tingling or new weaknesses Behavioral/Psych: Mood is stable, no new changes  All other systems were reviewed with the patient and are negative.  MEDICAL HISTORY:  Past Medical History:  Diagnosis Date  . Cancer ([place]) 10/21/2017   rectal cancer  . Rectal adenocarcinoma ([place]) 11/07/2017    SURGICAL HISTORY: Past Surgical History:  Procedure Laterality Date  . CHOLECYSTECTOMY    . EUS N/A 10/31/2017   Procedure: LOWER ENDOSCOPIC ULTRASOUND (EUS);  Surgeon: Carol Ada, MD;  Location: [facility];  Service: Endoscopy;  Laterality: N/A;    I have reviewed the social history and family history with the patient and they are unchanged from previous note.  ALLERGIES:  has No Known Allergies.  MEDICATIONS:  Current Outpatient Medications  Medication Sig Dispense Refill  . ALPRAZolam (XANAX) 0.5 MG tablet Take 1 tablet (0.5 mg total) by mouth at bedtime as needed for anxiety. 30 tablet 1  . potassium chloride SA (K-DUR) 20 MEQ tablet Take 1 tablet (20 mEq total) by  mouth daily. 14 tablet 0   No current facility-administered medications for this visit.     PHYSICAL EXAMINATION: ECOG PERFORMANCE STATUS: 1 - Symptomatic but completely ambulatory  No vitals taken today, Exam not performed today   LABORATORY DATA:  I have reviewed the data as listed CBC Latest Ref Rng & Units 06/11/2019 09/11/2018 07/14/2018  WBC 4.0 - 10.5  K/uL 6.9 7.0 7.4  Hemoglobin 13.0 - 17.0 g/dL 15.1 14.8 15.5  Hematocrit 39.0 - 52.0 % 43.8 43.4 44.4  Platelets 150 - 400 K/uL 221 249 250     CMP Latest Ref Rng & Units 06/11/2019 09/11/2018 07/14/2018  Glucose 70 - 99 mg/dL 106(H) 98 119(H)  BUN 8 - 23 mg/dL 10 6(L) 9  Creatinine 0.61 - 1.24 mg/dL 1.01 0.81 0.78  Sodium 135 - 145 mmol/L 138 139 137  Potassium 3.5 - 5.1 mmol/L 3.3(L) 3.9 3.1(L)  Chloride 98 - 111 mmol/L 105 103 103  CO2 22 - 32 mmol/L '23 27 29  '$ Calcium 8.9 - 10.3 mg/dL 8.3(L) 9.1 8.8(L)  Total Protein 6.5 - 8.1 g/dL 6.8 7.0 -  Total Bilirubin 0.3 - 1.2 mg/dL 0.7 0.5 -  Alkaline Phos 38 - 126 U/L 71 77 -  AST 15 - 41 U/L 15 16 -  ALT 0 - 44 U/L 9 8 -      RADIOGRAPHIC STUDIES: I have personally reviewed the radiological images as listed and agreed with the findings in the report. No results found.   ASSESSMENT & PLAN:  MATTHEWS FRANKS is a 73 y.o. male with   1. Rectal adenocarcinoma, uT3uN0M0, stag IIA -He has repeatedly declined the option of surgery -He completed chemoRT with Xeloda 1500 mg BID and radiation therapy with Dr. Lisbeth Renshaw 12/15/17-01/26/18. Due to his fatigue, I reduced his Xeloda dose.  -Wepreviouslydiscussed the possibility of cure with chemoradiation alone is very small, he will likelyresidual cancer or cancer recurrence. -Wepreviouslydiscussed his CT AP from 06/08/18 which showed good response to treatment, with no new or progressive disease seen. Based on the CT scan, I am not able to tell him if he has had a complete response.  -Since completing treatment he has had pencil like stool and constipation. This has persisted but his intermittent stool incontinence returned and more significant. He also has lower abdominal pain with bowel movement and nausea with eating. He also has right hip pain.  -We discussed CT CAP from 06/25/19 which shows no suspicion of metastatic cancer. I discussed CT scan is not the best tool to determine if he still  has residual disease in the rectum. I recommend partial colonoscopy to truly determine if he has residual disease. He notes even if he has cancer cells remaining he does not want any treatment for it and thinks I should discuss this with his daughter.  -I left his daughter Lattie Haw a message about the above. If pt does not want to do anything even if he has residual rectal cancer, then I will recommend observation for now.  -f/u as needed, I encouraged pt to call me if he develops new or worsening symptoms       2. Constipation, Bowel incontinence, Weight loss  -He completed treated in 01/2018 and his incontinence resolved and only had constipation. His last visit was 9 months ago in 09/2018. In there last few months his constipation persists with significant bowel incontinence intermittently. When he does have BM his stool is pencil shaped.  -He also has sharp intermittent pain with  bowel movements and nausea with eating.  -His weight has remained up.  -No bowel obstruction seen on 06/2019 CT scan   3. Right hip pain  -Has been present for 2 months. He recently had a fall, he denies injury, no concerns for fracture on exam  -This is only present with walking -No indications of fracture or abnormalities seen on 06/2019 CT scan   4. Smoking cessation  -Patient has expressed interest in quitting smoking. He smokes 1 pack/day on average but He indicates that he is not ready to quit yet. -Hehas previously been givenwritten information about smoking cessation support groups and the telephone number to the quit line. -Prescription for Nicoderm patches has previously been sent to his pharmacy. He is aware that he may start these when he feels ready to quit smoking.   5. Anxiety, Insomnia  -He has been anxious about bills and overwhelmed.  -He has been on Xanax BID at night. But has ran out.   -He has been having worsening insomnia. He has tried OTC sleep aides and prescription sleep aides  previously.  -I previously increased Xanax to 0.5 mg to take once nightly.    PLAN: -CT CAP reviewed with patient, no evidence of distant metastasis.  Patient daughter Lattie Haw voice message regarding the CT scan findings and encouraged her to call back.  -F/u as needed in future    No problem-specific Assessment & Plan notes found for this encounter.   No orders of the defined types were placed in this encounter.  I discussed the assessment and treatment plan with the patient. The patient was provided an opportunity to ask questions and all were answered. The patient agreed with the plan and demonstrated an understanding of the instructions.  The patient was advised to call back or seek an in-person evaluation if the symptoms worsen or if the condition fails to improve as anticipated.  I provided 12 minutes of non face-to-face telephone visit time during this encounter, and > 50% was spent counseling as documented under my assessment & plan.    Truitt Merle, MD 06/28/2019   I, Joslyn Devon, am acting as scribe for Truitt Merle, MD.   I have reviewed the above documentation for accuracy and completeness, and I agree with the above.

## 2019-06-25 ENCOUNTER — Encounter (HOSPITAL_COMMUNITY): Payer: Self-pay

## 2019-06-25 ENCOUNTER — Telehealth: Payer: Self-pay | Admitting: Hematology

## 2019-06-25 ENCOUNTER — Other Ambulatory Visit: Payer: Self-pay

## 2019-06-25 ENCOUNTER — Ambulatory Visit (HOSPITAL_COMMUNITY)
Admission: RE | Admit: 2019-06-25 | Discharge: 2019-06-25 | Disposition: A | Discharge status: Home / Self Care | Payer: Medicare HMO | Source: Ambulatory Visit | Attending: Hematology | Admitting: Hematology

## 2019-06-25 DIAGNOSIS — J439 Emphysema, unspecified: Secondary | ICD-10-CM | POA: Diagnosis not present

## 2019-06-25 DIAGNOSIS — N3289 Other specified disorders of bladder: Secondary | ICD-10-CM | POA: Diagnosis not present

## 2019-06-25 DIAGNOSIS — C20 Malignant neoplasm of rectum: Secondary | ICD-10-CM | POA: Diagnosis present

## 2019-06-25 DIAGNOSIS — I700 Atherosclerosis of aorta: Secondary | ICD-10-CM | POA: Diagnosis not present

## 2019-06-25 MED ORDER — SODIUM CHLORIDE (PF) 0.9 % IJ SOLN
INTRAMUSCULAR | Status: AC
  Filled 2019-06-25: qty 50

## 2019-06-25 MED ORDER — IOHEXOL 300 MG/ML  SOLN
100.0000 mL | Freq: Once | INTRAMUSCULAR | Status: AC | PRN
  Administered 2019-06-25: 10:00:00 100 mL via INTRAVENOUS

## 2019-06-25 NOTE — Telephone Encounter (Signed)
Called pt to remind him of his applt.

## 2019-06-28 ENCOUNTER — Inpatient Hospital Stay (HOSPITAL_BASED_OUTPATIENT_CLINIC_OR_DEPARTMENT_OTHER): Payer: Medicare HMO | Admitting: Hematology

## 2019-06-28 ENCOUNTER — Encounter: Payer: Self-pay | Admitting: Hematology

## 2019-06-28 DIAGNOSIS — Z9221 Personal history of antineoplastic chemotherapy: Secondary | ICD-10-CM

## 2019-06-28 DIAGNOSIS — Z923 Personal history of irradiation: Secondary | ICD-10-CM | POA: Diagnosis not present

## 2019-06-28 DIAGNOSIS — C20 Malignant neoplasm of rectum: Secondary | ICD-10-CM

## 2019-06-29 ENCOUNTER — Telehealth: Payer: Self-pay | Admitting: Hematology

## 2019-06-29 NOTE — Telephone Encounter (Signed)
No los per 7/27. °

## 2019-07-16 ENCOUNTER — Telehealth: Payer: Self-pay | Admitting: Hematology

## 2019-07-16 NOTE — Telephone Encounter (Signed)
Pt's daughter Wesley [name] called. She is concerned that he is not eating enough, he may have one meal a day, or even less. He complains abdominal pain after eating. We discussed plain diet, and high protein high Calori diet supplement such as Ensure or Boost. I recommend him to talk to our dietician, if he agrees. I also told Wesley [name] to buy OTC Pepcid or Omeprazole to see if his abdominal pain improves. We reviewed his recent CT scan which showed no evidence of cancer, although I still suspect he has residual rectal cancer. Wesley [name] will keep [place] informed if he develops new or worsening symptoms.   Truitt Merle 07/16/2019

## 2019-07-19 DIAGNOSIS — R079 Chest pain, unspecified: Secondary | ICD-10-CM | POA: Diagnosis not present

## 2020-03-31 DIAGNOSIS — H2513 Age-related nuclear cataract, bilateral: Secondary | ICD-10-CM | POA: Diagnosis not present

## 2020-04-27 DIAGNOSIS — H2512 Age-related nuclear cataract, left eye: Secondary | ICD-10-CM | POA: Diagnosis not present

## 2020-04-27 DIAGNOSIS — C189 Malignant neoplasm of colon, unspecified: Secondary | ICD-10-CM | POA: Diagnosis not present

## 2020-05-11 ENCOUNTER — Other Ambulatory Visit: Payer: Self-pay

## 2020-05-11 ENCOUNTER — Encounter: Payer: Self-pay | Admitting: Ophthalmology

## 2020-05-15 ENCOUNTER — Other Ambulatory Visit
Admission: RE | Admit: 2020-05-15 | Discharge: 2020-05-15 | Disposition: A | Discharge status: Home / Self Care | Payer: Medicare HMO | Source: Ambulatory Visit | Attending: Ophthalmology | Admitting: Ophthalmology

## 2020-05-15 ENCOUNTER — Other Ambulatory Visit: Payer: Self-pay

## 2020-05-15 DIAGNOSIS — Z20822 Contact with and (suspected) exposure to covid-19: Secondary | ICD-10-CM | POA: Diagnosis not present

## 2020-05-15 DIAGNOSIS — Z01812 Encounter for preprocedural laboratory examination: Secondary | ICD-10-CM | POA: Insufficient documentation

## 2020-05-15 LAB — SARS CORONAVIRUS 2 (TAT 6-24 HRS): SARS Coronavirus 2: NEGATIVE

## 2020-05-16 NOTE — Discharge Instructions (Signed)

## 2020-05-17 ENCOUNTER — Encounter
Admission: RE | Disposition: A | Discharge status: Home / Self Care | Payer: Self-pay | Source: Home / Self Care | Attending: Ophthalmology

## 2020-05-17 ENCOUNTER — Ambulatory Visit
Admission: RE | Admit: 2020-05-17 | Discharge: 2020-05-17 | Disposition: A | Discharge status: Home / Self Care | Payer: Medicare HMO | Attending: Ophthalmology | Admitting: Ophthalmology

## 2020-05-17 ENCOUNTER — Ambulatory Visit: Payer: Medicare HMO | Admitting: Anesthesiology

## 2020-05-17 ENCOUNTER — Encounter: Payer: Self-pay | Admitting: Ophthalmology

## 2020-05-17 ENCOUNTER — Other Ambulatory Visit: Payer: Self-pay

## 2020-05-17 DIAGNOSIS — H2512 Age-related nuclear cataract, left eye: Secondary | ICD-10-CM | POA: Insufficient documentation

## 2020-05-17 DIAGNOSIS — F329 Major depressive disorder, single episode, unspecified: Secondary | ICD-10-CM | POA: Diagnosis not present

## 2020-05-17 DIAGNOSIS — E785 Hyperlipidemia, unspecified: Secondary | ICD-10-CM | POA: Insufficient documentation

## 2020-05-17 DIAGNOSIS — G4700 Insomnia, unspecified: Secondary | ICD-10-CM | POA: Diagnosis not present

## 2020-05-17 DIAGNOSIS — R69 Illness, unspecified: Secondary | ICD-10-CM | POA: Diagnosis not present

## 2020-05-17 DIAGNOSIS — Z9221 Personal history of antineoplastic chemotherapy: Secondary | ICD-10-CM | POA: Diagnosis not present

## 2020-05-17 DIAGNOSIS — F17200 Nicotine dependence, unspecified, uncomplicated: Secondary | ICD-10-CM | POA: Diagnosis not present

## 2020-05-17 DIAGNOSIS — H25812 Combined forms of age-related cataract, left eye: Secondary | ICD-10-CM | POA: Diagnosis not present

## 2020-05-17 DIAGNOSIS — Z85048 Personal history of other malignant neoplasm of rectum, rectosigmoid junction, and anus: Secondary | ICD-10-CM | POA: Diagnosis not present

## 2020-05-17 DIAGNOSIS — Z923 Personal history of irradiation: Secondary | ICD-10-CM | POA: Diagnosis not present

## 2020-05-17 HISTORY — DX: Presence of dental prosthetic device (complete) (partial): Z97.2

## 2020-05-17 HISTORY — PX: CATARACT EXTRACTION W/PHACO: SHX586

## 2020-05-17 HISTORY — DX: Unspecified hearing loss, unspecified ear: H91.90

## 2020-05-17 HISTORY — DX: Presence of external hearing-aid: Z97.4

## 2020-05-17 SURGERY — PHACOEMULSIFICATION, CATARACT, WITH IOL INSERTION
Anesthesia: Monitor Anesthesia Care | Site: Eye | Laterality: Left

## 2020-05-17 MED ORDER — BRIMONIDINE TARTRATE-TIMOLOL 0.2-0.5 % OP SOLN
OPHTHALMIC | Status: DC | PRN
  Administered 2020-05-17: 1 [drp] via OPHTHALMIC

## 2020-05-17 MED ORDER — FENTANYL CITRATE (PF) 100 MCG/2ML IJ SOLN
INTRAMUSCULAR | Status: DC | PRN
  Administered 2020-05-17 (×2): 50 ug via INTRAVENOUS

## 2020-05-17 MED ORDER — CEFUROXIME OPHTHALMIC INJECTION 1 MG/0.1 ML
INJECTION | OPHTHALMIC | Status: DC | PRN
  Administered 2020-05-17: 0.1 mL via INTRACAMERAL

## 2020-05-17 MED ORDER — TETRACAINE HCL 0.5 % OP SOLN
1.0000 [drp] | OPHTHALMIC | Status: DC | PRN
  Administered 2020-05-17 (×3): 1 [drp] via OPHTHALMIC

## 2020-05-17 MED ORDER — MIDAZOLAM HCL 2 MG/2ML IJ SOLN
INTRAMUSCULAR | Status: DC | PRN
  Administered 2020-05-17 (×4): 1 mg via INTRAVENOUS

## 2020-05-17 MED ORDER — MOXIFLOXACIN HCL 0.5 % OP SOLN
1.0000 [drp] | OPHTHALMIC | Status: DC | PRN
  Administered 2020-05-17 (×3): 1 [drp] via OPHTHALMIC

## 2020-05-17 MED ORDER — NA HYALUR & NA CHOND-NA HYALUR 0.4-0.35 ML IO KIT
PACK | INTRAOCULAR | Status: DC | PRN
  Administered 2020-05-17: 1 mL via INTRAOCULAR

## 2020-05-17 MED ORDER — EPINEPHRINE PF 1 MG/ML IJ SOLN
INTRAOCULAR | Status: DC | PRN
  Administered 2020-05-17: 57 mL via OPHTHALMIC

## 2020-05-17 MED ORDER — LACTATED RINGERS IV SOLN
10.0000 mL/h | INTRAVENOUS | Status: DC
  Administered 2020-05-17: 10 mL/h via INTRAVENOUS

## 2020-05-17 MED ORDER — LIDOCAINE HCL (PF) 2 % IJ SOLN
INTRAOCULAR | Status: DC | PRN
  Administered 2020-05-17: 1 mL

## 2020-05-17 MED ORDER — ACETAMINOPHEN 325 MG PO TABS: 325.0000 mg | ORAL_TABLET | Freq: Once | ORAL | Status: DC

## 2020-05-17 MED ORDER — ACETAMINOPHEN 160 MG/5ML PO SOLN: 325.0000 mg | Freq: Once | ORAL | Status: DC

## 2020-05-17 MED ORDER — ARMC OPHTHALMIC DILATING DROPS
1.0000 "application " | OPHTHALMIC | Status: DC | PRN
  Administered 2020-05-17 (×3): 1 via OPHTHALMIC

## 2020-05-17 SURGICAL SUPPLY — 24 items
CANNULA ANT/CHMB 27G (MISCELLANEOUS) ×1 IMPLANT
CANNULA ANT/CHMB 27GA (MISCELLANEOUS) ×2 IMPLANT
GLOVE SURG LX 7.5 STRW (GLOVE) ×1
GLOVE SURG LX STRL 7.5 STRW (GLOVE) ×1 IMPLANT
GLOVE SURG TRIUMPH 8.0 PF LTX (GLOVE) ×2 IMPLANT
GOWN STRL REUS W/ TWL LRG LVL3 (GOWN DISPOSABLE) ×2 IMPLANT
GOWN STRL REUS W/TWL LRG LVL3 (GOWN DISPOSABLE) ×4
LENS IOL DIOP 21.5 (Intraocular Lens) ×2 IMPLANT
LENS IOL TECNIS MONO 21.5 (Intraocular Lens) IMPLANT
MARKER SKIN DUAL TIP RULER LAB (MISCELLANEOUS) ×2 IMPLANT
NDL CAPSULORHEX 25GA (NEEDLE) ×1 IMPLANT
NDL FILTER BLUNT 18X1 1/2 (NEEDLE) ×2 IMPLANT
NEEDLE CAPSULORHEX 25GA (NEEDLE) ×2 IMPLANT
NEEDLE FILTER BLUNT 18X 1/2SAF (NEEDLE) ×2
NEEDLE FILTER BLUNT 18X1 1/2 (NEEDLE) ×2 IMPLANT
PACK CATARACT BRASINGTON (MISCELLANEOUS) ×2 IMPLANT
PACK EYE AFTER SURG (MISCELLANEOUS) ×2 IMPLANT
PACK OPTHALMIC (MISCELLANEOUS) ×2 IMPLANT
RING MALYGIN 7.0 (MISCELLANEOUS) ×1 IMPLANT
SOLUTION OPHTHALMIC SALT (MISCELLANEOUS) ×2 IMPLANT
SYR 3ML LL SCALE MARK (SYRINGE) ×4 IMPLANT
SYR TB 1ML LUER SLIP (SYRINGE) ×2 IMPLANT
WATER STERILE IRR 250ML POUR (IV SOLUTION) ×2 IMPLANT
WIPE NON LINTING 3.25X3.25 (MISCELLANEOUS) ×2 IMPLANT

## 2020-05-17 NOTE — Anesthesia Procedure Notes (Signed)
Procedure Name: MAC Date/Time: 05/17/2020 2:02 PM Performed by: Vanetta Shawl, CRNA Pre-anesthesia Checklist: Patient identified, Emergency Drugs available, Suction available, Timeout performed and Patient being monitored Patient Re-evaluated:Patient Re-evaluated prior to induction Oxygen Delivery Method: Nasal cannula Placement Confirmation: positive ETCO2

## 2020-05-17 NOTE — Anesthesia Preprocedure Evaluation (Signed)
Anesthesia Evaluation  Patient identified by MRN, date of birth, ID band Patient awake    Reviewed: Allergy & Precautions, H&P , NPO status , Patient's Chart, lab work & pertinent test results  Airway Mallampati: II  TM Distance: >3 FB Neck ROM: full    Dental no notable dental hx. (+) Upper Dentures, Lower Dentures   Pulmonary Current Smoker,    Pulmonary exam normal breath sounds clear to auscultation       Cardiovascular Normal cardiovascular exam Rhythm:regular Rate:Normal     Neuro/Psych    GI/Hepatic   Endo/Other    Renal/GU      Musculoskeletal   Abdominal   Peds  Hematology   Anesthesia Other Findings   Reproductive/Obstetrics                             Anesthesia Physical Anesthesia Plan  ASA: II  Anesthesia Plan: MAC   Post-op Pain Management:    Induction:   PONV Risk Score and Plan: 1 and Treatment may vary due to age or medical condition, TIVA and Midazolam  Airway Management Planned:   Additional Equipment:   Intra-op Plan:   Post-operative Plan:   Informed Consent: I have reviewed the patients History and Physical, chart, labs and discussed the procedure including the risks, benefits and alternatives for the proposed anesthesia with the patient or authorized representative who has indicated his/her understanding and acceptance.     Dental Advisory Given  Plan Discussed with: CRNA  Anesthesia Plan Comments:         Anesthesia Quick Evaluation

## 2020-05-17 NOTE — Anesthesia Postprocedure Evaluation (Signed)
Anesthesia Post Note  Patient: Wesley [name]  Procedure(s) Performed: CATARACT EXTRACTION PHACO AND INTRAOCULAR LENS PLACEMENT (IOC) LEFT 8.43  00:57.1  14.7% (Left Eye)     Patient location during evaluation: PACU Anesthesia Type: MAC Level of consciousness: awake and alert and oriented Pain management: satisfactory to patient Vital Signs Assessment: post-procedure vital signs reviewed and stable Respiratory status: spontaneous breathing, nonlabored ventilation and respiratory function stable Cardiovascular status: blood pressure returned to baseline and stable Postop Assessment: Adequate PO intake and No signs of nausea or vomiting Anesthetic complications: no   No complications documented.  Raliegh Ip

## 2020-05-17 NOTE — Op Note (Signed)
OPERATIVE NOTE  RANDALE CARVALHO [id number] 05/17/2020  PREOPERATIVE DIAGNOSIS:   Nuclear sclerotic cataract left eye with miotic pupil      H25.12   POSTOPERATIVE DIAGNOSIS:   Nuclear sclerotic cataract left eye with miotic pupil.     PROCEDURE:  Phacoemulsification with posterior chamber intraocular lens implantation of the left eye which required pupil stretching with the Malyugin pupil expansion device  Ultrasound time: Procedure(s): CATARACT EXTRACTION PHACO AND INTRAOCULAR LENS PLACEMENT (IOC) LEFT 8.43  00:57.1  14.7% (Left)  LENS:   Implant Name Type Inv. Item Serial No. Manufacturer Lot No. LRB No. Used Action  LENS IOL DIOP 21.5 - [id number] Intraocular Lens LENS IOL DIOP 21.5 [id number]  Left 1 Implanted     SURGEON:  Wyonia Hough, MD   ANESTHESIA: Topical with tetracaine drops and 2% Xylocaine jelly, augmented with 1% preservative-free intracameral lidocaine.   COMPLICATIONS:  None.   DESCRIPTION OF PROCEDURE:  The patient was identified in the holding room and transported to the operating room and placed in the supine position under the operating microscope.  The left eye was identified as the operative eye and it was prepped and draped in the usual sterile ophthalmic fashion.   A 1 millimeter clear-corneal paracentesis was made at the 1:30 position.  The anterior chamber was filled with Viscoat viscoelastic.  0.5 ml of preservative-free 1% lidocaine was injected into the anterior chamber.  A 2.4 millimeter keratome was used to make a near-clear corneal incision at the 10:30 position.  A Malyugin pupil expander was then placed through the main incision and into the anterior chamber of the eye.  The edge of the iris was secured on the lip of the pupil expander and it was released, thereby expanding the pupil to approximately 7 millimeters for completion of the cataract surgery.  Additional Viscoat was placed in the anterior chamber.  A cystotome and capsulorrhexis  forceps were used to make a curvilinear capsulorrhexis.   Balanced salt solution was used to hydrodissect and hydrodelineate the lens nucleus.   Phacoemulsification was used in stop and chop fashion to remove the lens, nucleus and epinucleus.  The remaining cortex was aspirated using the irrigation aspiration handpiece.  Additional Provisc was placed into the eye to distend the capsular bag for lens placement.  A lens was then injected into the capsular bag.  The pupil expanding ring was removed using a Kuglen hook and insertion device. The remaining viscoelastic was aspirated from the capsular bag and the anterior chamber.  The anterior chamber was filled with balanced salt solution to inflate to a physiologic pressure.   Wounds were hydrated with balanced salt solution.  The anterior chamber was inflated to a physiologic pressure with balanced salt solution.  No wound leaks were noted. Cefuroxime 0.1 ml of a 10mg /ml solution was injected into the anterior chamber for a dose of 1 mg of intracameral antibiotic at the completion of the case.   Timolol and Brimonidine drops were applied to the eye.  The patient was taken to the recovery room in stable condition without complications of anesthesia or surgery.  Alyse Kathan 05/17/2020, 2:28 PM

## 2020-05-17 NOTE — Progress Notes (Signed)
Patient had cream in his coffee at 0800, anesthesia informed. Pt's surgery will have to be delayed until 2pm. Pt advised to have nothing else to eat or drink and to arrive back at surgery center at 1pm, pt verbalizes understanding

## 2020-05-17 NOTE — Transfer of Care (Signed)
Immediate Anesthesia Transfer of Care Note  Patient: Wesley [name]  Procedure(s) Performed: CATARACT EXTRACTION PHACO AND INTRAOCULAR LENS PLACEMENT (IOC) LEFT 8.43  00:57.1  14.7% (Left Eye)  Patient Location: PACU  Anesthesia Type: MAC  Level of Consciousness: awake, alert  and patient cooperative  Airway and Oxygen Therapy: Patient Spontanous Breathing   Post-op Assessment: Post-op Vital signs reviewed, Patient's Cardiovascular Status Stable, Respiratory Function Stable, Patent Airway and No signs of Nausea or vomiting  Post-op Vital Signs: Reviewed and stable  Complications: No complications documented.

## 2020-05-17 NOTE — H&P (Signed)

## 2020-05-18 ENCOUNTER — Encounter: Payer: Self-pay | Admitting: Ophthalmology

## 2020-06-13 DIAGNOSIS — H6122 Impacted cerumen, left ear: Secondary | ICD-10-CM | POA: Diagnosis not present

## 2020-06-30 DIAGNOSIS — H2511 Age-related nuclear cataract, right eye: Secondary | ICD-10-CM | POA: Diagnosis not present

## 2020-07-13 ENCOUNTER — Other Ambulatory Visit: Payer: Self-pay

## 2020-07-13 ENCOUNTER — Encounter: Payer: Self-pay | Admitting: Ophthalmology

## 2020-07-17 ENCOUNTER — Other Ambulatory Visit: Payer: Self-pay

## 2020-07-17 ENCOUNTER — Other Ambulatory Visit
Admission: RE | Admit: 2020-07-17 | Discharge: 2020-07-17 | Disposition: A | Discharge status: Home / Self Care | Payer: Medicare HMO | Source: Ambulatory Visit | Attending: Ophthalmology | Admitting: Ophthalmology

## 2020-07-17 DIAGNOSIS — Z01812 Encounter for preprocedural laboratory examination: Secondary | ICD-10-CM | POA: Diagnosis not present

## 2020-07-17 DIAGNOSIS — Z20822 Contact with and (suspected) exposure to covid-19: Secondary | ICD-10-CM | POA: Diagnosis not present

## 2020-07-17 LAB — SARS CORONAVIRUS 2 (TAT 6-24 HRS): SARS Coronavirus 2: NEGATIVE

## 2020-07-17 NOTE — Discharge Instructions (Signed)

## 2020-07-19 ENCOUNTER — Other Ambulatory Visit: Payer: Self-pay

## 2020-07-19 ENCOUNTER — Ambulatory Visit
Admission: RE | Admit: 2020-07-19 | Discharge: 2020-07-19 | Disposition: A | Discharge status: Home / Self Care | Payer: Medicare HMO | Attending: Ophthalmology | Admitting: Ophthalmology

## 2020-07-19 ENCOUNTER — Ambulatory Visit: Payer: Medicare HMO | Admitting: Anesthesiology

## 2020-07-19 ENCOUNTER — Encounter: Payer: Self-pay | Admitting: Ophthalmology

## 2020-07-19 ENCOUNTER — Encounter
Admission: RE | Disposition: A | Discharge status: Home / Self Care | Payer: Self-pay | Source: Home / Self Care | Attending: Ophthalmology

## 2020-07-19 DIAGNOSIS — Z923 Personal history of irradiation: Secondary | ICD-10-CM | POA: Diagnosis not present

## 2020-07-19 DIAGNOSIS — Z9842 Cataract extraction status, left eye: Secondary | ICD-10-CM | POA: Diagnosis not present

## 2020-07-19 DIAGNOSIS — Z85048 Personal history of other malignant neoplasm of rectum, rectosigmoid junction, and anus: Secondary | ICD-10-CM | POA: Diagnosis not present

## 2020-07-19 DIAGNOSIS — Z9221 Personal history of antineoplastic chemotherapy: Secondary | ICD-10-CM | POA: Diagnosis not present

## 2020-07-19 DIAGNOSIS — F17200 Nicotine dependence, unspecified, uncomplicated: Secondary | ICD-10-CM | POA: Insufficient documentation

## 2020-07-19 DIAGNOSIS — R69 Illness, unspecified: Secondary | ICD-10-CM | POA: Diagnosis not present

## 2020-07-19 DIAGNOSIS — H2511 Age-related nuclear cataract, right eye: Secondary | ICD-10-CM | POA: Diagnosis not present

## 2020-07-19 DIAGNOSIS — H25811 Combined forms of age-related cataract, right eye: Secondary | ICD-10-CM | POA: Diagnosis not present

## 2020-07-19 DIAGNOSIS — H5703 Miosis: Secondary | ICD-10-CM | POA: Insufficient documentation

## 2020-07-19 HISTORY — PX: CATARACT EXTRACTION W/PHACO: SHX586

## 2020-07-19 HISTORY — DX: Depression, unspecified: F32.A

## 2020-07-19 SURGERY — PHACOEMULSIFICATION, CATARACT, WITH IOL INSERTION
Anesthesia: Monitor Anesthesia Care | Site: Eye | Laterality: Right

## 2020-07-19 MED ORDER — EPINEPHRINE PF 1 MG/ML IJ SOLN
INTRAOCULAR | Status: DC | PRN
  Administered 2020-07-19: 81 mL via OPHTHALMIC

## 2020-07-19 MED ORDER — ARMC OPHTHALMIC DILATING DROPS
1.0000 "application " | OPHTHALMIC | Status: DC | PRN
  Administered 2020-07-19 (×3): 1 via OPHTHALMIC

## 2020-07-19 MED ORDER — CEFUROXIME OPHTHALMIC INJECTION 1 MG/0.1 ML
INJECTION | OPHTHALMIC | Status: DC | PRN
  Administered 2020-07-19: 0.1 mL via OPHTHALMIC

## 2020-07-19 MED ORDER — TETRACAINE HCL 0.5 % OP SOLN
1.0000 [drp] | OPHTHALMIC | Status: DC | PRN
  Administered 2020-07-19 (×3): 1 [drp] via OPHTHALMIC

## 2020-07-19 MED ORDER — NA HYALUR & NA CHOND-NA HYALUR 0.4-0.35 ML IO KIT
PACK | INTRAOCULAR | Status: DC | PRN
  Administered 2020-07-19: 1 mL via INTRAOCULAR

## 2020-07-19 MED ORDER — LACTATED RINGERS IV SOLN: INTRAVENOUS | Status: DC

## 2020-07-19 MED ORDER — MOXIFLOXACIN HCL 0.5 % OP SOLN
1.0000 [drp] | OPHTHALMIC | Status: DC | PRN
  Administered 2020-07-19 (×3): 1 [drp] via OPHTHALMIC

## 2020-07-19 MED ORDER — MIDAZOLAM HCL 2 MG/2ML IJ SOLN
INTRAMUSCULAR | Status: DC | PRN
  Administered 2020-07-19 (×2): 2 mg via INTRAVENOUS

## 2020-07-19 MED ORDER — FENTANYL CITRATE (PF) 100 MCG/2ML IJ SOLN
INTRAMUSCULAR | Status: DC | PRN
  Administered 2020-07-19 (×2): 100 ug via INTRAVENOUS

## 2020-07-19 MED ORDER — LIDOCAINE HCL (PF) 2 % IJ SOLN
INTRAOCULAR | Status: DC | PRN
  Administered 2020-07-19: 1 mL

## 2020-07-19 MED ORDER — BRIMONIDINE TARTRATE-TIMOLOL 0.2-0.5 % OP SOLN
OPHTHALMIC | Status: DC | PRN
  Administered 2020-07-19: 1 [drp] via OPHTHALMIC

## 2020-07-19 SURGICAL SUPPLY — 29 items
CANNULA ANT/CHMB 27G (MISCELLANEOUS) ×1 IMPLANT
CANNULA ANT/CHMB 27GA (MISCELLANEOUS) ×2 IMPLANT
GLOVE SURG LX 7.5 STRW (GLOVE) ×2
GLOVE SURG LX STRL 7.5 STRW (GLOVE) ×1 IMPLANT
GLOVE SURG TRIUMPH 8.0 PF LTX (GLOVE) ×2 IMPLANT
GOWN STRL REUS W/ TWL LRG LVL3 (GOWN DISPOSABLE) ×2 IMPLANT
GOWN STRL REUS W/TWL LRG LVL3 (GOWN DISPOSABLE) ×4
LENS IOL DIOP 22.0 (Intraocular Lens) ×2 IMPLANT
LENS IOL TECNIS MONO 22.0 (Intraocular Lens) IMPLANT
MARKER SKIN DUAL TIP RULER LAB (MISCELLANEOUS) ×2 IMPLANT
NDL CAPSULORHEX 25GA (NEEDLE) ×1 IMPLANT
NDL FILTER BLUNT 18X1 1/2 (NEEDLE) ×2 IMPLANT
NDL RETROBULBAR .5 NSTRL (NEEDLE) IMPLANT
NEEDLE CAPSULORHEX 25GA (NEEDLE) ×2 IMPLANT
NEEDLE FILTER BLUNT 18X 1/2SAF (NEEDLE) ×2
NEEDLE FILTER BLUNT 18X1 1/2 (NEEDLE) ×2 IMPLANT
PACK CATARACT BRASINGTON (MISCELLANEOUS) ×2 IMPLANT
PACK EYE AFTER SURG (MISCELLANEOUS) ×2 IMPLANT
PACK OPTHALMIC (MISCELLANEOUS) ×2 IMPLANT
RING MALYGIN 7.0 (MISCELLANEOUS) ×1 IMPLANT
SOLUTION OPHTHALMIC SALT (MISCELLANEOUS) ×2 IMPLANT
SUT ETHILON 10-0 CS-B-6CS-B-6 (SUTURE)
SUT VICRYL  9 0 (SUTURE)
SUT VICRYL 9 0 (SUTURE) IMPLANT
SUTURE EHLN 10-0 CS-B-6CS-B-6 (SUTURE) IMPLANT
SYR 3ML LL SCALE MARK (SYRINGE) ×4 IMPLANT
SYR TB 1ML LUER SLIP (SYRINGE) ×2 IMPLANT
WATER STERILE IRR 250ML POUR (IV SOLUTION) ×2 IMPLANT
WIPE NON LINTING 3.25X3.25 (MISCELLANEOUS) ×2 IMPLANT

## 2020-07-19 NOTE — Anesthesia Postprocedure Evaluation (Signed)
Anesthesia Post Note  Patient: Wesley [name]  Procedure(s) Performed: CATARACT EXTRACTION PHACO AND INTRAOCULAR LENS PLACEMENT (IOC) RIGHT MALYUGIN (Right Eye)     Anesthesia Post Evaluation No complications documented.  Elenora Hawbaker [employer]

## 2020-07-19 NOTE — Anesthesia Procedure Notes (Signed)
Procedure Name: MAC Date/Time: 07/19/2020 12:50 PM Performed by: Silvana Newness, CRNA Pre-anesthesia Checklist: Patient identified, Emergency Drugs available, Suction available, Patient being monitored and Timeout performed Patient Re-evaluated:Patient Re-evaluated prior to induction Oxygen Delivery Method: Nasal cannula Placement Confirmation: positive ETCO2

## 2020-07-19 NOTE — Transfer of Care (Signed)
Immediate Anesthesia Transfer of Care Note  Patient: Wesley [name]  Procedure(s) Performed: CATARACT EXTRACTION PHACO AND INTRAOCULAR LENS PLACEMENT ([place]) RIGHT MALYUGIN (Right Eye)  Patient Location: PACU  Anesthesia Type: MAC  Level of Consciousness: awake, alert  and patient cooperative  Airway and Oxygen Therapy: Patient Spontanous Breathing and Patient connected to supplemental oxygen  Post-op Assessment: Post-op Vital signs reviewed, Patient's Cardiovascular Status Stable, Respiratory Function Stable, Patent Airway and No signs of Nausea or vomiting  Post-op Vital Signs: Reviewed and stable  Complications: No complications documented.

## 2020-07-19 NOTE — Anesthesia Preprocedure Evaluation (Signed)
Anesthesia Evaluation  Patient identified by MRN, date of birth, ID band Patient awake    Reviewed: Allergy & Precautions, H&P , NPO status , Patient's Chart, lab work & pertinent test results  Airway Mallampati: II  TM Distance: >3 FB Neck ROM: full    Dental  (+) Upper Dentures, Lower Dentures   Pulmonary Current SmokerPatient did not abstain from smoking.,    Pulmonary exam normal breath sounds clear to auscultation       Cardiovascular negative cardio ROS Normal cardiovascular exam Rhythm:regular Rate:Normal     Neuro/Psych Depression    GI/Hepatic Neg liver ROS, H/o rectal cancer   Endo/Other  negative endocrine ROS  Renal/GU negative Renal ROS     Musculoskeletal negative musculoskeletal ROS (+)   Abdominal Normal abdominal exam  (+)   Peds  Hematology   Anesthesia Other Findings   Reproductive/Obstetrics                             Anesthesia Physical  Anesthesia Plan  ASA: II  Anesthesia Plan: MAC   Post-op Pain Management:    Induction:   PONV Risk Score and Plan: 1 and Treatment may vary due to age or medical condition, TIVA and Midazolam  Airway Management Planned:   Additional Equipment: None  Intra-op Plan:   Post-operative Plan:   Informed Consent: I have reviewed the patients History and Physical, chart, labs and discussed the procedure including the risks, benefits and alternatives for the proposed anesthesia with the patient or authorized representative who has indicated his/her understanding and acceptance.     Dental Advisory Given  Plan Discussed with: CRNA  Anesthesia Plan Comments:         Anesthesia Quick Evaluation

## 2020-07-19 NOTE — H&P (Signed)

## 2020-07-19 NOTE — Op Note (Signed)
OPERATIVE NOTE  Wesley [name] [id number] 07/19/2020   PREOPERATIVE DIAGNOSIS:    Nuclear Sclerotic Cataract Right eye with miotic pupil.        H25.11  POSTOPERATIVE DIAGNOSIS: Nuclear Sclerotic Cataract Right eye with miotic pupil.          PROCEDURE:  Phacoemusification with posterior chamber intraocular lens placement of the right eye which required pupil stretching with the Malyugin pupil expansion device. Ultrasound time: Procedure(s) with comments: CATARACT EXTRACTION PHACO AND INTRAOCULAR LENS PLACEMENT (IOC) RIGHT MALYUGIN (Right) - 12.59, 01:23.4, 15.1%  LENS:   Implant Name Type Inv. Item Serial No. Manufacturer Lot No. LRB No. Used Action  LENS IOL DIOP 22.0 - [id number] Intraocular Lens LENS IOL DIOP 22.0 [id number] [employer]  Right 1 Implanted       SURGEON:  Wyonia Hough, MD   ANESTHESIA:  Topical with tetracaine drops and 2% Xylocaine jelly, augmented with 1% preservative-free intracameral lidocaine.   COMPLICATIONS:  None.   DESCRIPTION OF PROCEDURE:  The patient was identified in the holding room and transported to the operating room and placed in the supine position under the operating microscope. Theright eye was identified as the operative eye and it was prepped and draped in the usual sterile ophthalmic fashion.   A 1 millimeter clear-corneal paracentesis was made at the 12:00 position.  0.5 ml of preservative-free 1% lidocaine was injected into the anterior chamber. The anterior chamber was filled with Viscoat viscoelastic.  A 2.4 millimeter keratome was used to make a near-clear corneal incision at the 9:00 position. A Malyugin pupil expander was then placed through the main incision and into the anterior chamber of the eye.  The edge of the iris was secured on the lip of the pupil expander and it was released, thereby expanding the pupil to approximately 7 millimeters for completion of the cataract surgery.  Additional Viscoat was  placed in the anterior chamber.  A cystotome and capsulorrhexis forceps were used to make a curvilinear capsulorrhexis.   Balanced salt solution was used to hydrodissect and hydrodelineate the lens nucleus.   Phacoemulsification was used in stop and chop fashion to remove the lens, nucleus and epinucleus.  The remaining cortex was aspirated using the irrigation aspiration handpiece.  Additional Provisc was placed into the eye to distend the capsular bag for lens placement.  A lens was then injected into the capsular bag.  The pupil expanding ring was removed using a Kuglen hook and insertion device. The remaining viscoelastic was aspirated from the capsular bag and the anterior chamber.  The anterior chamber was filled with balanced salt solution to inflate to a physiologic pressure.  Wounds were hydrated with balanced salt solution.  The anterior chamber was inflated to a physiologic pressure with balanced salt solution.  No wound leaks were noted.Cefuroxime 0.1 ml of a 10mg /ml solution was injected into the anterior chamber for a dose of 1 mg of intracameral antibiotic at the completion of the case. Timolol and Brimonidine drops were applied to the eye.  The patient was taken to the recovery room in stable condition without complications of anesthesia or surgery.  [name] 07/19/2020, 1:12 PM

## 2020-07-20 ENCOUNTER — Encounter: Payer: Self-pay | Admitting: Ophthalmology

## 2020-08-24 DIAGNOSIS — Z961 Presence of intraocular lens: Secondary | ICD-10-CM | POA: Diagnosis not present

## 2021-01-29 ENCOUNTER — Telehealth: Payer: Self-pay | Admitting: Hematology

## 2021-01-29 NOTE — Telephone Encounter (Signed)
Patients sister called in to schedule an appt. Sent provider a msg to make sure it was ok to add to her schedule. Will contact patient and sister once I get confirmation

## 2021-01-30 ENCOUNTER — Telehealth: Payer: Self-pay | Admitting: Hematology

## 2021-01-30 ENCOUNTER — Other Ambulatory Visit: Payer: Self-pay

## 2021-01-30 DIAGNOSIS — C20 Malignant neoplasm of rectum: Secondary | ICD-10-CM

## 2021-01-30 NOTE — Telephone Encounter (Signed)
Informed patient's sister of his upcoming appointment. Patient's sister is aware.

## 2021-01-30 NOTE — Telephone Encounter (Signed)
Left message with upcoming appointment. Gave option to call back to reschedule if needed. 

## 2021-01-31 ENCOUNTER — Inpatient Hospital Stay: Payer: Medicare HMO | Attending: Hematology | Admitting: Hematology

## 2021-01-31 ENCOUNTER — Other Ambulatory Visit: Payer: Self-pay

## 2021-01-31 ENCOUNTER — Inpatient Hospital Stay: Payer: Medicare HMO

## 2021-01-31 ENCOUNTER — Encounter: Payer: Self-pay | Admitting: Hematology

## 2021-01-31 VITALS — BP 135/72 | HR 74 | Temp 97.7°F | Resp 18 | Ht 66.0 in | Wt 138.0 lb

## 2021-01-31 DIAGNOSIS — C20 Malignant neoplasm of rectum: Secondary | ICD-10-CM

## 2021-01-31 DIAGNOSIS — F419 Anxiety disorder, unspecified: Secondary | ICD-10-CM | POA: Insufficient documentation

## 2021-01-31 DIAGNOSIS — R69 Illness, unspecified: Secondary | ICD-10-CM | POA: Diagnosis not present

## 2021-01-31 DIAGNOSIS — G4700 Insomnia, unspecified: Secondary | ICD-10-CM | POA: Diagnosis not present

## 2021-01-31 DIAGNOSIS — Z79899 Other long term (current) drug therapy: Secondary | ICD-10-CM | POA: Diagnosis not present

## 2021-01-31 DIAGNOSIS — F17200 Nicotine dependence, unspecified, uncomplicated: Secondary | ICD-10-CM | POA: Insufficient documentation

## 2021-01-31 LAB — CBC WITH DIFFERENTIAL (CANCER CENTER ONLY)
Abs Immature Granulocytes: 0.04 10*3/uL (ref 0.00–0.07)
Basophils Absolute: 0.1 10*3/uL (ref 0.0–0.1)
Basophils Relative: 1 %
Eosinophils Absolute: 0.2 10*3/uL (ref 0.0–0.5)
Eosinophils Relative: 3 %
HCT: 44.4 % (ref 39.0–52.0)
Hemoglobin: 14.9 g/dL (ref 13.0–17.0)
Immature Granulocytes: 1 %
Lymphocytes Relative: 21 %
Lymphs Abs: 1.4 10*3/uL (ref 0.7–4.0)
MCH: 30 pg (ref 26.0–34.0)
MCHC: 33.6 g/dL (ref 30.0–36.0)
MCV: 89.5 fL (ref 80.0–100.0)
Monocytes Absolute: 0.8 10*3/uL (ref 0.1–1.0)
Monocytes Relative: 11 %
Neutro Abs: 4.4 10*3/uL (ref 1.7–7.7)
Neutrophils Relative %: 63 %
Platelet Count: 230 10*3/uL (ref 150–400)
RBC: 4.96 MIL/uL (ref 4.22–5.81)
RDW: 13.3 % (ref 11.5–15.5)
WBC Count: 6.9 10*3/uL (ref 4.0–10.5)
nRBC: 0 % (ref 0.0–0.2)

## 2021-01-31 LAB — SAMPLE TO BLOOD BANK

## 2021-01-31 LAB — CEA (IN HOUSE-CHCC): CEA (CHCC-In House): 2.25 ng/mL (ref 0.00–5.00)

## 2021-01-31 LAB — CMP (CANCER CENTER ONLY)
ALT: 15 U/L (ref 0–44)
AST: 16 U/L (ref 15–41)
Albumin: 3.7 g/dL (ref 3.5–5.0)
Alkaline Phosphatase: 70 U/L (ref 38–126)
Anion gap: 9 (ref 5–15)
BUN: 9 mg/dL (ref 8–23)
CO2: 23 mmol/L (ref 22–32)
Calcium: 9 mg/dL (ref 8.9–10.3)
Chloride: 107 mmol/L (ref 98–111)
Creatinine: 0.89 mg/dL (ref 0.61–1.24)
GFR, Estimated: >60 mL/min (ref >60)
Glucose, Bld: 108 mg/dL — ABNORMAL HIGH (ref 70–99)
Potassium: 4.1 mmol/L (ref 3.5–5.1)
Sodium: 139 mmol/L (ref 135–145)
Total Bilirubin: 0.5 mg/dL (ref 0.3–1.2)
Total Protein: 6.9 g/dL (ref 6.5–8.1)

## 2021-01-31 MED ORDER — ZOLPIDEM TARTRATE 5 MG PO TABS: 5.0000 mg | ORAL_TABLET | Freq: Every evening | ORAL | 0 refills | Status: DC | PRN

## 2021-01-31 NOTE — Progress Notes (Signed)
[facility]   [phone] Fax:([phone]   Clinic Follow up Note   Patient Care Team: Lorre Munroe, NP as PCP - General (Internal Medicine) Jeani Hawking, MD as Consulting Physician (Gastroenterology) Malachy Mood, MD as Consulting Physician (Hematology) Pollyann Samples, NP as Nurse Practitioner (Nurse Practitioner)  Date of Service:  01/31/2021  CHIEF COMPLAINT: Follow up rectal cancer  SUMMARY OF ONCOLOGIC HISTORY: Oncology History Overview Note  Cancer Staging Rectal adenocarcinoma [facility]) Staging form: Colon and Rectum, AJCC 8th Edition - Clinical stage from 10/31/2017: Stage IIA (cT3, cN0, cM0) - Signed by Malachy Mood, MD on 11/28/2017     Rectal adenocarcinoma (HCC)  10/21/2017 Procedure   COLONOSCOPY per Dr. Jeani Hawking Findings: A fungating, infiltrative and ulcerated non--obstructing large mass was found in the rectum.  The mass was circumferential.  The mass measured 3 cm in length.  Oozing was present.  This was biopsied with a cold snare for histology.  2 sessile polyps were found in the transverse colon and ascending colon.  The polyps were 3-4 mm in size.  The polyps were removed with a cold snare.  Section and retrieval were complete.  A 15 mm polyp was found in the sigmoid colon.  The polyp was semi-pedunculated.  The polyp was removed with a hot snare.  Resection and retrieval were complete  The mass was extremely friable and palpable with a rectal examination.  The distal extent was 5 cm from the dentate line and it was 3 cm in length.  Specimens were obtained using a cold snare.  Retroflexion was not possible with the close proximity of the lesion in the rectum.      10/21/2017 Initial Biopsy   Final microscopic diagnosis A.  Colon, ascending, polyp: Tubular adenoma No high-grade dysplasia or malignancy  B.  Colon, transverse, polyp: Tubular adenoma No high-grade dysplasia or malignancy  C.  Colon, sigmoid,  polyp: Tubular adenoma with high-grade dysplasia and mucosal prolapse  D.  Rectum, mass, biopsy: Invasive adenocarcinoma, moderately differentiated Immunohistochemical stains for ML H1, MSH 2, MSH 6 and PMS 2 are intact (normal)  There is no evidence of DNA mismatch repair deficiency in the carcinoma, indicating that the tumor is most likely microsatellite stable, and that Lynch syndrome (a common cause of hereditary non-polyposis colorectal cancer or HNPCC) is very unlikely   10/21/2017 Imaging   CT A/P W CONTRAST IMPRESSION: 1. Irregular mural and mucosal thickening of the rectum with small right 9 mm short axis index lymph node adjacent to the rectum. 2. Diffuse mild to moderate fluid-filled distention of small bowel loops query ileus or dysmotility. No mechanical source obstruction is seen. 3. Submucosal fatty infiltration of the descending and sigmoid colon possibly representing stigmata of chronic inflammatory bowel disease.    10/21/2017 Initial Diagnosis   Rectal adenocarcinoma (HCC)   10/31/2017 Procedure   EUS: Findings: A hypoechoic mass was found in the rectum. The mass was encountered at 5 cm (from the anal verge). The mass was partially circumferential (involving 50% of the lumen). The endosonographic borders were poorly-defined and irregular. The mass measured 30 mm (in maximum length) by 13 mm (in maximum thickness). There was sonographic evidence suggesting breakthrough of the muscularis propria with invasion into the perirectal fat (uT3, manifested by prominent pseudopodia). There was no sonographic evidence of invasion into the adjacent structures. Area was tattooed with an injection of 3 mL of Spot (carbon black) both proximally and distally. I was not able to identify  any significant lymph nodes. - Rectal mass was visualized endosonographically. A tissue diagnosis was obtained prior to this exam. This is of adenocarcinoma. This was staged uT3 uN0. Tattooed. -  No specimens collected.   11/21/2017 Imaging   CT Chest 11/21/17  IMPRESSION: 1. No evidence of thoracic metastatic disease. 2. Moderate centrilobular emphysema. 3. Mild coronary artery and Aortic Atherosclerosis (ICD10-I70.0).   12/15/2017 - 01/26/2018 Chemotherapy   concurrent chemoradiation with Xeloda 1500 mg BID Monday - Friday. Dose reduced on 12/29/17 to 1500 mg in the AM and 1000 mg in PM   12/16/2017 - 01/26/2018 Radiation Therapy    concurrent chemoradiation at [facility]   06/08/2018 Imaging   CT AP W Contrast  IMPRESSION: 1. Response to therapy of rectal primary and perirectal adenopathy. 2. No new or progressive disease identified. 3. Bladder underdistention.  Cannot exclude concurrent cystitis. 4.  Aortic Atherosclerosis (ICD10-I70.0). 5. Left-sided IVC.   06/25/2019 Imaging   CT CAP W Contrast  IMPRESSION: 1. No acute process or evidence of metastatic disease in the chest, abdomen, or pelvis. 2. Redemonstration of apparent bladder wall thickening, at least partially felt to be due to underdistention. Cystitis (presumably radiation induced) cannot be excluded. 3. Coronary artery atherosclerosis. 4. Left IVC.   Aortic Atherosclerosis (ICD10-I70.0) and Emphysema (ICD10-J43.9).      CURRENT THERAPY:  Surveillance  INTERVAL HISTORY:  Wesley [name] is here for a follow up of rectal cancer. He was last seen by me in 06/2019. He notes he has been having blood in his stool the last 2 BMs. He notes most of blood occurred when wiping. He denies constipation. He notes abdominal bloating. He notes he eats 1 meal a day. He notes he lost about 7 pounds from baseline. He notes he feels tired and does not want to leave his bed most days. He denies rectal pain or chest discomfort.   He notes he is selling his home in [place]. His children would like him to move closer to them in [place] and [place].    REVIEW OF SYSTEMS:   Constitutional: Denies fevers, chills (+)  weight loss Eyes: Denies blurriness of vision Ears, nose, mouth, throat, and face: Denies mucositis or sore throat Respiratory: Denies cough, dyspnea or wheezes Cardiovascular: Denies palpitation, chest discomfort or lower extremity swelling Gastrointestinal:  Denies nausea, heartburn or change in bowel habits (+) Blood in stool (+) Abdominal bloating  Skin: Denies abnormal skin rashes Lymphatics: Denies new lymphadenopathy or easy bruising Neurological:Denies numbness, tingling or new weaknesses Behavioral/Psych: Mood is stable, no new changes  All other systems were reviewed with the patient and are negative.  MEDICAL HISTORY:  Past Medical History:  Diagnosis Date  . Cancer ([place]) 10/21/2017   rectal cancer  . Depression   . HOH (hard of hearing)   . Rectal adenocarcinoma ([place]) 11/07/2017   Stage III, radiation and chemo 2019  . Wears dentures    full upper and lower  . Wears hearing aid in both ears     SURGICAL HISTORY: Past Surgical History:  Procedure Laterality Date  . CATARACT EXTRACTION W/PHACO Left 05/17/2020   Procedure: CATARACT EXTRACTION PHACO AND INTRAOCULAR LENS PLACEMENT (IOC) LEFT 8.43  00:57.1  14.7%;  Surgeon: Leandrew Koyanagi, MD;  Location: [facility];  Service: Ophthalmology;  Laterality: Left;  . CATARACT EXTRACTION W/PHACO Right 07/19/2020   Procedure: CATARACT EXTRACTION PHACO AND INTRAOCULAR LENS PLACEMENT ([place]) RIGHT MALYUGIN;  Surgeon: Leandrew Koyanagi, MD;  Location: [facility];  Service: Ophthalmology;  Laterality: Right;  12.59, 01:23.4, 15.1%  . CHOLECYSTECTOMY    . EUS N/A 10/31/2017   Procedure: LOWER ENDOSCOPIC ULTRASOUND (EUS);  Surgeon: Carol Ada, MD;  Location: [facility];  Service: Endoscopy;  Laterality: N/A;    I have reviewed the social history and family history with the patient and they are unchanged from previous note.  ALLERGIES:  has No Known Allergies.  MEDICATIONS:  Current Outpatient Medications   Medication Sig Dispense Refill  . zolpidem (AMBIEN) 5 MG tablet Take 1-2 tablets (5-10 mg total) by mouth at bedtime as needed for sleep. 15 tablet 0  . ALPRAZolam (XANAX) 0.5 MG tablet Take 1 tablet (0.5 mg total) by mouth at bedtime as needed for anxiety. (Patient not taking: Reported on 05/11/2020) 30 tablet 1  . potassium chloride SA (K-DUR) 20 MEQ tablet Take 1 tablet (20 mEq total) by mouth daily. (Patient not taking: Reported on 05/11/2020) 14 tablet 0   No current facility-administered medications for this visit.    PHYSICAL EXAMINATION: ECOG PERFORMANCE STATUS: 1 - Symptomatic but completely ambulatory  Vitals:   01/31/21 1449  BP: 135/72  Pulse: 74  Resp: 18  Temp: 97.7 F (36.5 C)  SpO2: 99%   Filed Weights   01/31/21 1449  Weight: 138 lb (62.6 kg)    GENERAL:alert, no distress and comfortable SKIN: skin color, texture, turgor are normal, no rashes or significant lesions EYES: normal, Conjunctiva are pink and non-injected, sclera clear  NECK: supple, thyroid normal size, non-tender, without nodularity LYMPH:  no palpable lymphadenopathy in the cervical, axillary  LUNGS: clear to auscultation and percussion with normal breathing effort HEART: regular rate & rhythm and no murmurs and no lower extremity edema ABDOMEN:abdomen soft, non-tender and normal bowel sounds (+) Mild right abdominal tenderness, no hepatomegaly  Musculoskeletal:no cyanosis of digits and no clubbing  NEURO: alert & oriented x 3 with fluent speech, no focal motor/sensory deficits Patient declined rectal exam today   LABORATORY DATA:  I have reviewed the data as listed CBC Latest Ref Rng & Units 01/31/2021 06/11/2019 09/11/2018  WBC 4.0 - 10.5 K/uL 6.9 6.9 7.0  Hemoglobin 13.0 - 17.0 g/dL 14.9 15.1 14.8  Hematocrit 39.0 - 52.0 % 44.4 43.8 43.4  Platelets 150 - 400 K/uL 230 221 249     CMP Latest Ref Rng & Units 01/31/2021 06/11/2019 09/11/2018  Glucose 70 - 99 mg/dL 108(H) 106(H) 98  BUN 8 - 23  mg/dL 9 10 6(L)  Creatinine 0.61 - 1.24 mg/dL 0.89 1.01 0.81  Sodium 135 - 145 mmol/L 139 138 139  Potassium 3.5 - 5.1 mmol/L 4.1 3.3(L) 3.9  Chloride 98 - 111 mmol/L 107 105 103  CO2 22 - 32 mmol/L $RemoveB'23 23 27  'vsPHwBZB$ Calcium 8.9 - 10.3 mg/dL 9.0 8.3(L) 9.1  Total Protein 6.5 - 8.1 g/dL 6.9 6.8 7.0  Total Bilirubin 0.3 - 1.2 mg/dL 0.5 0.7 0.5  Alkaline Phos 38 - 126 U/L 70 71 77  AST 15 - 41 U/L $Remo'16 15 16  'EmckB$ ALT 0 - 44 U/L $Remo'15 9 8      'TbPKh$ RADIOGRAPHIC STUDIES: I have personally reviewed the radiological images as listed and agreed with the findings in the report. No results found.   ASSESSMENT & PLAN:  Wesley [name] is a 75 y.o. male with    1. Rectal adenocarcinoma, uT3uN0M0, stag IIA -He was diagnosed in 10/2017. He has repeatedly declined the option of surgery.  -He completed chemoRT with Xeloda 1500 mg BID and radiation therapy  with Dr. Lisbeth Renshaw 12/15/17-01/26/18. -Wepreviouslydiscussed the possibility of cure with chemoradiation alone is low, he likely hasresidual cancer or will have cancer recurrence. He is currently on Surveillance.  -He notes in the past few months he has passed blood with stool intermittently. He notes abdominal bloating, eating less and mild weight loss. He denies current pain.  -He declined rectal exam today  -I discussed further evaluation to determine if his cancer has recurred by partial colonoscopy and scan. If cancer recurrence, he may be eligible for surgery or chemotherapy if he has recurrent disease He is not very interested in treatment but interested in knowing if his has returned. I suggest starting with CT CAP and partial colonoscopy with Dr Benson Norway. He is agreeable.  -Labs reviewed, CBC and CMP WNL except BG 108. CEA still pending.   -F/u in 2 weeks     2. Rectal bleeding, Weight loss, Abdominal bloating -He notes in the past few months he has passed blood with stool intermittently. He notes abdominal bloating, eating less and mild weight loss. He denies  current pain. He does have mild Right abdominal tenderness on exam today (01/31/21). He declined rectal exam today  -He is interested in further work up, will proceed.     3. Smoking cessation  -Patient has expressed interest in quitting smoking. He smokes 1 pack/day on average but He indicates that he is not ready to quit yet. -Hehas previously been givenwritten information about smoking cessation support groups and the telephone number to the quit line. -Prescription for Nicoderm patches has previously been sent to his pharmacy. He is aware that he may start these when he feels ready to quit smoking.   4. Anxiety, Insomnia, Social Support  -He has been anxious about bills and overwhelmed.   -He is on Xanax BID  -I recommend he try melatonin or benadryl for his insomnia first. If not enough I will call in low dose Ambien.  -He has a daughter and sister in [place] and another daughter in [place]. They have encouraged him to sell house in [place] and move closer to them. His house is currently up for sale.    PLAN: -I called in Ambien today  -CT CAP w contrast at [facility] in 1-2 weeks, I gave him oral contrast  -Partial Colonoscopy with Dr Benson Norway in 1-2 weeks, I sent a message to Dr. Benson Norway  -Lab and F/u in 3 weeks, after CT and sigmoidoscopy     No problem-specific Assessment & Plan notes found for this encounter.   Orders Placed This Encounter  Procedures  . CT CHEST ABDOMEN PELVIS W CONTRAST    Standing Status:   Future    Standing Expiration Date:   01/31/2022    Order Specific Question:   If indicated for the ordered procedure, I authorize the administration of contrast media per Radiology protocol    Answer:   Yes    Order Specific Question:   Preferred imaging location?    Answer:   [facility]    Order Specific Question:   Release to patient    Answer:   Immediate    Order Specific Question:   Is Oral Contrast requested for this exam?    Answer:    Yes, Per Radiology protocol    Order Specific Question:   Reason for Exam (SYMPTOM  OR DIAGNOSIS REQUIRED)    Answer:   rule out cancer recurrence   All questions were answered. The patient knows to call the  clinic with any problems, questions or concerns. No barriers to learning was detected. The total time spent in the appointment was 30 minutes.     Truitt Merle, MD 01/31/2021   I, Joslyn Devon, am acting as scribe for Truitt Merle, MD.   I have reviewed the above documentation for accuracy and completeness, and I agree with the above.

## 2021-02-01 ENCOUNTER — Telehealth: Payer: Self-pay | Admitting: Hematology

## 2021-02-01 NOTE — Telephone Encounter (Signed)
Checked out appointment. No LOS notes needing to be scheduled. No changes made. 

## 2021-02-08 ENCOUNTER — Telehealth: Payer: Self-pay

## 2021-02-08 NOTE — Telephone Encounter (Signed)
I spoke with Wesley [name] regarding Dr Benson Norway.  She wil encourage her brother to call them back and make an appt.

## 2021-02-14 ENCOUNTER — Telehealth: Payer: Self-pay

## 2021-02-14 NOTE — Telephone Encounter (Signed)
I left message for Mr Wesley [name]'s daughter Wesley [name] to call me.  I told her that his CT scan has been scheduled.

## 2021-02-15 NOTE — Telephone Encounter (Signed)
I spoke with Mr Friedt daughter, Lattie Haw. She states that Mr Gharibian is very stressed and focused on selling his house.   She relays she is not sure he will go for the CT scan tomorrow. She will encourage him to go.  I let her know that Dr Benson Norway will reach out to scheduled sigmoidoscope.  She verbalized understanding.

## 2021-02-16 ENCOUNTER — Ambulatory Visit (HOSPITAL_COMMUNITY): Payer: Medicare HMO

## 2021-03-08 ENCOUNTER — Ambulatory Visit (HOSPITAL_COMMUNITY): Payer: Medicare HMO

## 2021-07-09 ENCOUNTER — Telehealth: Payer: Self-pay

## 2021-07-09 NOTE — Telephone Encounter (Signed)
This nurse received a message this morning from patients daughter.  She is requesting to make an appointment for this patient because he is having increased pain in is stomach.  Daughter stated patient was seen by Dr. Burr Medico before and decided not to move forward with testing and treatment however patient is now more open to evaluation and treatment.  This nurse reached out to scheduling to set an appointment.

## 2021-07-12 ENCOUNTER — Other Ambulatory Visit: Payer: Self-pay

## 2021-07-12 ENCOUNTER — Ambulatory Visit (HOSPITAL_COMMUNITY)
Admission: RE | Admit: 2021-07-12 | Discharge: 2021-07-12 | Disposition: A | Discharge status: Home / Self Care | Payer: Medicare HMO | Source: Ambulatory Visit | Attending: Hematology | Admitting: Hematology

## 2021-07-12 ENCOUNTER — Ambulatory Visit (HOSPITAL_COMMUNITY): Payer: Medicare HMO

## 2021-07-12 DIAGNOSIS — J432 Centrilobular emphysema: Secondary | ICD-10-CM | POA: Diagnosis not present

## 2021-07-12 DIAGNOSIS — C20 Malignant neoplasm of rectum: Secondary | ICD-10-CM | POA: Insufficient documentation

## 2021-07-12 DIAGNOSIS — I745 Embolism and thrombosis of iliac artery: Secondary | ICD-10-CM | POA: Diagnosis not present

## 2021-07-12 DIAGNOSIS — I513 Intracardiac thrombosis, not elsewhere classified: Secondary | ICD-10-CM | POA: Diagnosis not present

## 2021-07-12 DIAGNOSIS — N200 Calculus of kidney: Secondary | ICD-10-CM | POA: Diagnosis not present

## 2021-07-12 DIAGNOSIS — I2510 Atherosclerotic heart disease of native coronary artery without angina pectoris: Secondary | ICD-10-CM | POA: Diagnosis not present

## 2021-07-12 LAB — POCT I-STAT CREATININE: Creatinine, Ser: 0.9 mg/dL (ref 0.61–1.24)

## 2021-07-12 MED ORDER — IOHEXOL 300 MG/ML  SOLN
100.0000 mL | Freq: Once | INTRAMUSCULAR | Status: AC | PRN
  Administered 2021-07-12: 100 mL via INTRAVENOUS

## 2021-07-18 ENCOUNTER — Inpatient Hospital Stay (HOSPITAL_BASED_OUTPATIENT_CLINIC_OR_DEPARTMENT_OTHER): Payer: Medicare HMO | Admitting: Hematology

## 2021-07-18 ENCOUNTER — Encounter: Payer: Self-pay | Admitting: Hematology

## 2021-07-18 ENCOUNTER — Other Ambulatory Visit: Payer: Self-pay

## 2021-07-18 ENCOUNTER — Inpatient Hospital Stay: Payer: Medicare HMO | Attending: Hematology

## 2021-07-18 VITALS — BP 122/70 | HR 72 | Temp 98.3°F | Resp 17 | Wt 131.6 lb

## 2021-07-18 DIAGNOSIS — F419 Anxiety disorder, unspecified: Secondary | ICD-10-CM | POA: Diagnosis not present

## 2021-07-18 DIAGNOSIS — R634 Abnormal weight loss: Secondary | ICD-10-CM | POA: Diagnosis not present

## 2021-07-18 DIAGNOSIS — R197 Diarrhea, unspecified: Secondary | ICD-10-CM | POA: Insufficient documentation

## 2021-07-18 DIAGNOSIS — C20 Malignant neoplasm of rectum: Secondary | ICD-10-CM

## 2021-07-18 DIAGNOSIS — Z923 Personal history of irradiation: Secondary | ICD-10-CM | POA: Diagnosis not present

## 2021-07-18 DIAGNOSIS — Z79899 Other long term (current) drug therapy: Secondary | ICD-10-CM | POA: Diagnosis not present

## 2021-07-18 DIAGNOSIS — F17210 Nicotine dependence, cigarettes, uncomplicated: Secondary | ICD-10-CM | POA: Diagnosis not present

## 2021-07-18 DIAGNOSIS — Z85048 Personal history of other malignant neoplasm of rectum, rectosigmoid junction, and anus: Secondary | ICD-10-CM | POA: Insufficient documentation

## 2021-07-18 DIAGNOSIS — R630 Anorexia: Secondary | ICD-10-CM | POA: Diagnosis not present

## 2021-07-18 DIAGNOSIS — Z9221 Personal history of antineoplastic chemotherapy: Secondary | ICD-10-CM | POA: Insufficient documentation

## 2021-07-18 DIAGNOSIS — G4700 Insomnia, unspecified: Secondary | ICD-10-CM | POA: Insufficient documentation

## 2021-07-18 DIAGNOSIS — R140 Abdominal distension (gaseous): Secondary | ICD-10-CM | POA: Diagnosis not present

## 2021-07-18 DIAGNOSIS — F32A Depression, unspecified: Secondary | ICD-10-CM | POA: Diagnosis not present

## 2021-07-18 DIAGNOSIS — R69 Illness, unspecified: Secondary | ICD-10-CM | POA: Diagnosis not present

## 2021-07-18 LAB — CBC WITH DIFFERENTIAL (CANCER CENTER ONLY)
Abs Immature Granulocytes: 0.03 10*3/uL (ref 0.00–0.07)
Basophils Absolute: 0.1 10*3/uL (ref 0.0–0.1)
Basophils Relative: 1 %
Eosinophils Absolute: 0.2 10*3/uL (ref 0.0–0.5)
Eosinophils Relative: 3 %
HCT: 43.6 % (ref 39.0–52.0)
Hemoglobin: 15.1 g/dL (ref 13.0–17.0)
Immature Granulocytes: 0 %
Lymphocytes Relative: 20 %
Lymphs Abs: 1.5 10*3/uL (ref 0.7–4.0)
MCH: 30.3 pg (ref 26.0–34.0)
MCHC: 34.6 g/dL (ref 30.0–36.0)
MCV: 87.4 fL (ref 80.0–100.0)
Monocytes Absolute: 0.8 10*3/uL (ref 0.1–1.0)
Monocytes Relative: 11 %
Neutro Abs: 4.7 10*3/uL (ref 1.7–7.7)
Neutrophils Relative %: 65 %
Platelet Count: 282 10*3/uL (ref 150–400)
RBC: 4.99 MIL/uL (ref 4.22–5.81)
RDW: 13.2 % (ref 11.5–15.5)
WBC Count: 7.4 10*3/uL (ref 4.0–10.5)
nRBC: 0 % (ref 0.0–0.2)

## 2021-07-18 LAB — CMP (CANCER CENTER ONLY)
ALT: 15 U/L (ref 0–44)
AST: 15 U/L (ref 15–41)
Albumin: 3.5 g/dL (ref 3.5–5.0)
Alkaline Phosphatase: 75 U/L (ref 38–126)
Anion gap: 9 (ref 5–15)
BUN: 9 mg/dL (ref 8–23)
CO2: 27 mmol/L (ref 22–32)
Calcium: 8.9 mg/dL (ref 8.9–10.3)
Chloride: 106 mmol/L (ref 98–111)
Creatinine: 0.91 mg/dL (ref 0.61–1.24)
GFR, Estimated: >60 mL/min (ref >60)
Glucose, Bld: 105 mg/dL — ABNORMAL HIGH (ref 70–99)
Potassium: 3.4 mmol/L — ABNORMAL LOW (ref 3.5–5.1)
Sodium: 142 mmol/L (ref 135–145)
Total Bilirubin: 0.7 mg/dL (ref 0.3–1.2)
Total Protein: 6.9 g/dL (ref 6.5–8.1)

## 2021-07-18 LAB — CEA (IN HOUSE-CHCC): CEA (CHCC-In House): 2.45 ng/mL (ref 0.00–5.00)

## 2021-07-18 NOTE — Progress Notes (Signed)
[place]   Telephone:([phone]) [phone] Fax:(336) [phone]   Clinic Follow up Note   Patient Care Team: Jearld Fenton, NP as PCP - General (Internal Medicine) Carol Ada, MD as Consulting Physician (Gastroenterology) Truitt Merle, MD as Consulting Physician (Hematology) Alla Feeling, NP as Nurse Practitioner (Nurse Practitioner)  Date of Service:  07/18/2021  CHIEF COMPLAINT: Follow up rectal cancer   SUMMARY OF ONCOLOGIC HISTORY: Oncology History Overview Note  Cancer Staging Rectal adenocarcinoma [facility]) Staging form: Colon and Rectum, AJCC 8th Edition - Clinical stage from 10/31/2017: Stage IIA (cT3, cN0, cM0) - Signed by Truitt Merle, MD on 11/28/2017     Rectal adenocarcinoma ([place])  10/21/2017 Procedure   COLONOSCOPY per Dr. Carol Ada Findings: A fungating, infiltrative and ulcerated non--obstructing large mass was found in the rectum.  The mass was circumferential.  The mass measured 3 cm in length.  Oozing was present.  This was biopsied with a cold snare for histology.  2 sessile polyps were found in the transverse colon and ascending colon.  The polyps were 3-4 mm in size.  The polyps were removed with a cold snare.  Section and retrieval were complete.  A 15 mm polyp was found in the sigmoid colon.  The polyp was semi-pedunculated.  The polyp was removed with a hot snare.  Resection and retrieval were complete  The mass was extremely friable and palpable with a rectal examination.  The distal extent was 5 cm from the dentate line and it was 3 cm in length.  Specimens were obtained using a cold snare.  Retroflexion was not possible with the close proximity of the lesion in the rectum.      10/21/2017 Initial Biopsy   Final microscopic diagnosis A.  Colon, ascending, polyp: Tubular adenoma No high-grade dysplasia or malignancy  B.  Colon, transverse, polyp: Tubular adenoma No high-grade dysplasia or malignancy  C.  Colon, sigmoid,  polyp: Tubular adenoma with high-grade dysplasia and mucosal prolapse  D.  Rectum, mass, biopsy: Invasive adenocarcinoma, moderately differentiated Immunohistochemical stains for ML H1, [place] 2, [place] 6 and PMS 2 are intact (normal)  There is no evidence of DNA mismatch repair deficiency in the carcinoma, indicating that the tumor is most likely microsatellite stable, and that Lynch syndrome (a common cause of hereditary non-polyposis colorectal cancer or HNPCC) is very unlikely   10/21/2017 Imaging   CT A/P W CONTRAST IMPRESSION: 1. Irregular mural and mucosal thickening of the rectum with small right 9 mm short axis index lymph node adjacent to the rectum. 2. Diffuse mild to moderate fluid-filled distention of small bowel loops query ileus or dysmotility. No mechanical source obstruction is seen. 3. Submucosal fatty infiltration of the descending and sigmoid colon possibly representing stigmata of chronic inflammatory bowel disease.     10/21/2017 Initial Diagnosis   Rectal adenocarcinoma ([place])   10/31/2017 Procedure   EUS: Findings: A hypoechoic mass was found in the rectum. The mass was encountered at 5 cm (from the anal verge). The mass was partially circumferential (involving 50% of the lumen). The endosonographic borders were poorly-defined and irregular. The mass measured 30 mm (in maximum length) by 13 mm (in maximum thickness). There was sonographic evidence suggesting breakthrough of the muscularis propria with invasion into the perirectal fat (uT3, manifested by prominent pseudopodia). There was no sonographic evidence of invasion into the adjacent structures. Area was tattooed with an injection of 3 mL of Spot (carbon black) both proximally and distally. I was not able  to identify any significant lymph nodes. - Rectal mass was visualized endosonographically. A tissue diagnosis was obtained prior to this exam. This is of adenocarcinoma. This was staged uT3 uN0. Tattooed. -  No specimens collected.   11/21/2017 Imaging   CT Chest 11/21/17  IMPRESSION: 1. No evidence of thoracic metastatic disease. 2. Moderate centrilobular emphysema. 3. Mild coronary artery and Aortic Atherosclerosis (ICD10-I70.0).   12/15/2017 - 01/26/2018 Chemotherapy   concurrent chemoradiation with Xeloda 1500 mg BID Monday - Friday. Dose reduced on 12/29/17 to 1500 mg in the AM and 1000 mg in PM   12/16/2017 - 01/26/2018 Radiation Therapy    [facility]ad[facility]   06/08/2018 Imaging   CT AP W Contrast  IMPRESSION: 1. Response to therapy of rectal primary and perirectal adenopathy. 2. No new or progressive disease identified. 3. Bladder underdistention.  Cannot exclude concurrent cystitis. 4.  Aortic Atherosclerosis (ICD10-I70.0). 5. Left-sided IVC.   06/25/2019 Imaging   CT CAP W Contrast  IMPRESSION: 1. No acute process or evidence of metastatic disease in the chest, abdomen, or pelvis. 2. Redemonstration of apparent bladder wall thickening, at least partially felt to be due to underdistention. Cystitis (presumably radiation induced) cannot be excluded. 3. Coronary artery atherosclerosis. 4. Left IVC.   Aortic Atherosclerosis (ICD10-I70.0) and Emphysema (ICD10-J43.9).      CURRENT THERAPY:  Surveillance  INTERVAL HISTORY:  Wesley [name] is here for a follow up of rectal cancer. He was last seen by me on 01/31/2021. He presents to the clinic with his sister.  He remains to be very frustrated and unhappy.  He has a watery bowel movement after eating, and not able to go out to restaurants and eat much.  He does not cook, has not been eating much at home due to the fear of diarrhea, but he has not tried Imodium.  I prescribed Ambien for his insomnia on last visit, he tried it once but did not work, and he threw the bottle away.  He was supposed to do a CT scan after last visit 5 months ago, but did not do it until last week.  I referred him back to Dr. Benson Norway,  but he has not had repeat colonoscopy.  He does not like seeing doctors or take medicine.  He has lost 7 pounds since last visit.  All other systems were reviewed with the patient and are negative.  MEDICAL HISTORY:  Past Medical History:  Diagnosis Date   Cancer ([place]) 10/21/2017   rectal cancer   Depression    HOH (hard of hearing)    Rectal adenocarcinoma ([place]) 11/07/2017   Stage III, radiation and chemo 2019   Wears dentures    full upper and lower   Wears hearing aid in both ears     SURGICAL HISTORY: Past Surgical History:  Procedure Laterality Date   CATARACT EXTRACTION W/PHACO Left 05/17/2020   Procedure: CATARACT EXTRACTION PHACO AND INTRAOCULAR LENS PLACEMENT (IOC) LEFT 8.43  00:57.1  14.7%;  Surgeon: Leandrew Koyanagi, MD;  Location: [place];  Service: Ophthalmology;  Laterality: Left;   CATARACT EXTRACTION W/PHACO Right 07/19/2020   Procedure: CATARACT EXTRACTION PHACO AND INTRAOCULAR LENS PLACEMENT ([place]) RIGHT MALYUGIN;  Surgeon: Leandrew Koyanagi, MD;  Location: [place];  Service: Ophthalmology;  Laterality: Right;  12.59, 01:23.4, 15.1%   CHOLECYSTECTOMY     EUS N/A 10/31/2017   Procedure: LOWER ENDOSCOPIC ULTRASOUND (EUS);  Surgeon: Carol Ada, MD;  Location: [facility];  Service: Endoscopy;  Laterality: N/A;  I have reviewed the social history and family history with the patient and they are unchanged from previous note.  ALLERGIES:  has No Known Allergies.  MEDICATIONS:  Current Outpatient Medications  Medication Sig Dispense Refill   ALPRAZolam (XANAX) 0.5 MG tablet Take 1 tablet (0.5 mg total) by mouth at bedtime as needed for anxiety. (Patient not taking: Reported on 05/11/2020) 30 tablet 1   potassium chloride SA (K-DUR) 20 MEQ tablet Take 1 tablet (20 mEq total) by mouth daily. (Patient not taking: Reported on 05/11/2020) 14 tablet 0   zolpidem (AMBIEN) 5 MG tablet Take 1-2 tablets (5-10 mg total) by mouth at bedtime as  needed for sleep. 15 tablet 0   No current facility-administered medications for this visit.    PHYSICAL EXAMINATION: ECOG PERFORMANCE STATUS: 1 - Symptomatic but completely ambulatory  Vitals:   07/18/21 1232  BP: 122/70  Pulse: 72  Resp: 17  Temp: 98.3 F (36.8 C)  SpO2: 97%   Filed Weights   07/18/21 1232  Weight: 131 lb 9.6 oz (59.7 kg)    GENERAL:alert, no distress and comfortable SKIN: skin color, texture, turgor are normal, no rashes or significant lesions EYES: normal, Conjunctiva are pink and non-injected, sclera clear  Musculoskeletal:no cyanosis of digits and no clubbing  NEURO: alert & oriented x 3 with fluent speech, no focal motor/sensory deficits   LABORATORY DATA:  I have reviewed the data as listed CBC Latest Ref Rng & Units 07/18/2021 01/31/2021 06/11/2019  WBC 4.0 - 10.5 K/uL 7.4 6.9 6.9  Hemoglobin 13.0 - 17.0 g/dL 15.1 14.9 15.1  Hematocrit 39.0 - 52.0 % 43.6 44.4 43.8  Platelets 150 - 400 K/uL 282 230 221     CMP Latest Ref Rng & Units 07/18/2021 07/12/2021 01/31/2021  Glucose 70 - 99 mg/dL 105(H) - 108(H)  BUN 8 - 23 mg/dL 9 - 9  Creatinine 0.61 - 1.24 mg/dL 0.91 0.90 0.89  Sodium 135 - 145 mmol/L 142 - 139  Potassium 3.5 - 5.1 mmol/L 3.4(L) - 4.1  Chloride 98 - 111 mmol/L 106 - 107  CO2 22 - 32 mmol/L 27 - 23  Calcium 8.9 - 10.3 mg/dL 8.9 - 9.0  Total Protein 6.5 - 8.1 g/dL 6.9 - 6.9  Total Bilirubin 0.3 - 1.2 mg/dL 0.7 - 0.5  Alkaline Phos 38 - 126 U/L 75 - 70  AST 15 - 41 U/L 15 - 16  ALT 0 - 44 U/L 15 - 15      RADIOGRAPHIC STUDIES: I have personally reviewed the radiological images as listed and agreed with the findings in the report. No results found.   ASSESSMENT & PLAN:  ORAZIO WELLER is a 75 y.o. male with    1. Rectal adenocarcinoma, uT3uN0M0, stag IIA -He was diagnosed in 10/2017. He has repeatedly declined the option of surgery.  -He completed chemoRT with Xeloda 1500 mg BID and radiation therapy with Dr. Lisbeth Renshaw  12/15/17-01/26/18. -We previously discussed the possibility of cure with chemoradiation alone is low, he likely has residual cancer or will have cancer recurrence. He is currently on Surveillance.  -He has had intermittent hematochezia, abdominal bloating, eating less and mild weight loss. He denies current pain.  -I reviewed his recent CT scan, which showed no definite evidence of residual cancer or metastatic disease.  He has a 5 mm lesion in the liver which is indeterminate. -I recommended colonoscopy on last visit, he has never scheduled.  I given him Dr. Ulyses Amor office number and encouraged  him to call (Dr. Ulyses Amor office was not able to get hold of him) -I recommend continue monitoring.      2. Rectal bleeding, diarrhea, weight loss, Abdominal bloating -He is very frustrated about his GI symptoms, particularly loose bowel movement after each meal, but he has not tried Imodium or anything else. -I encouraged him to try Imodium, his sister will buy some for him.    3. Smoking cessation  -Patient has expressed interest in quitting smoking. He smokes 1 pack/day on average but He indicates that he is not ready to quit yet. -He has previously been given written information about smoking cessation support groups and the telephone number to the quit line. -Prescription for Nicoderm patches has previously been sent to his pharmacy. He is aware that he may start these when he feels ready to quit smoking.    4. Anxiety, Insomnia, Social Support  -He has been anxious about bills and overwhelmed.   -He is on Xanax BID  -I called in Ambien last visit, he tried it once but did not help, and does not want to try again.     PLAN: -CT scan reviewed, no definitive evidence of residual cancer or metastasis -I encouraged him to call Dr.Hung's office to schedule colonoscopy -Lab and follow-up in 3 months -Dietitian consult for weight loss and diarrhea after eating   No problem-specific Assessment & Plan  notes found for this encounter.   No orders of the defined types were placed in this encounter.  All questions were answered. The patient knows to call the clinic with any problems, questions or concerns. No barriers to learning was detected. The total time spent in the appointment was 30 minutes.     Truitt Merle, MD 07/18/2021   I, Wesley [name], am acting as scribe for Truitt Merle, MD.   I have reviewed the above documentation for accuracy and completeness, and I agree with the above.

## 2021-07-23 DIAGNOSIS — R531 Weakness: Secondary | ICD-10-CM | POA: Diagnosis not present

## 2021-07-23 DIAGNOSIS — R197 Diarrhea, unspecified: Secondary | ICD-10-CM | POA: Diagnosis not present

## 2021-07-23 DIAGNOSIS — R6881 Early satiety: Secondary | ICD-10-CM | POA: Diagnosis not present

## 2021-07-23 DIAGNOSIS — R140 Abdominal distension (gaseous): Secondary | ICD-10-CM | POA: Diagnosis not present

## 2021-07-23 DIAGNOSIS — Z85048 Personal history of other malignant neoplasm of rectum, rectosigmoid junction, and anus: Secondary | ICD-10-CM | POA: Diagnosis not present

## 2021-08-02 ENCOUNTER — Ambulatory Visit (HOSPITAL_COMMUNITY): Payer: Medicare HMO | Admitting: Dietician

## 2021-08-02 NOTE — Progress Notes (Signed)
Nutrition  Patient did not show for scheduled nutrition appointment today.  

## 2021-08-24 ENCOUNTER — Inpatient Hospital Stay (HOSPITAL_COMMUNITY)
Admission: EM | Admit: 2021-08-24 | Discharge: 2021-09-05 | DRG: 331 | Disposition: A | Discharge status: Skilled Nursing Facility | Payer: Medicare HMO | Attending: Family Medicine | Admitting: Family Medicine

## 2021-08-24 ENCOUNTER — Other Ambulatory Visit: Payer: Self-pay

## 2021-08-24 ENCOUNTER — Encounter (HOSPITAL_COMMUNITY): Payer: Self-pay

## 2021-08-24 DIAGNOSIS — H9193 Unspecified hearing loss, bilateral: Secondary | ICD-10-CM | POA: Diagnosis present

## 2021-08-24 DIAGNOSIS — G4700 Insomnia, unspecified: Secondary | ICD-10-CM | POA: Diagnosis present

## 2021-08-24 DIAGNOSIS — Z923 Personal history of irradiation: Secondary | ICD-10-CM

## 2021-08-24 DIAGNOSIS — K551 Chronic vascular disorders of intestine: Secondary | ICD-10-CM | POA: Diagnosis present

## 2021-08-24 DIAGNOSIS — Z803 Family history of malignant neoplasm of breast: Secondary | ICD-10-CM

## 2021-08-24 DIAGNOSIS — Z833 Family history of diabetes mellitus: Secondary | ICD-10-CM

## 2021-08-24 DIAGNOSIS — Z720 Tobacco use: Secondary | ICD-10-CM | POA: Diagnosis present

## 2021-08-24 DIAGNOSIS — K55041 Focal (segmental) acute infarction of large intestine: Principal | ICD-10-CM | POA: Diagnosis present

## 2021-08-24 DIAGNOSIS — Z85048 Personal history of other malignant neoplasm of rectum, rectosigmoid junction, and anus: Secondary | ICD-10-CM

## 2021-08-24 DIAGNOSIS — D72829 Elevated white blood cell count, unspecified: Secondary | ICD-10-CM | POA: Diagnosis present

## 2021-08-24 DIAGNOSIS — R54 Age-related physical debility: Secondary | ICD-10-CM | POA: Diagnosis present

## 2021-08-24 DIAGNOSIS — C20 Malignant neoplasm of rectum: Secondary | ICD-10-CM | POA: Diagnosis present

## 2021-08-24 DIAGNOSIS — E785 Hyperlipidemia, unspecified: Secondary | ICD-10-CM | POA: Diagnosis present

## 2021-08-24 DIAGNOSIS — Z66 Do not resuscitate: Secondary | ICD-10-CM | POA: Diagnosis present

## 2021-08-24 DIAGNOSIS — F32A Depression, unspecified: Secondary | ICD-10-CM | POA: Diagnosis present

## 2021-08-24 DIAGNOSIS — F419 Anxiety disorder, unspecified: Secondary | ICD-10-CM | POA: Diagnosis present

## 2021-08-24 DIAGNOSIS — Z20822 Contact with and (suspected) exposure to covid-19: Secondary | ICD-10-CM | POA: Diagnosis present

## 2021-08-24 DIAGNOSIS — F17210 Nicotine dependence, cigarettes, uncomplicated: Secondary | ICD-10-CM | POA: Diagnosis present

## 2021-08-24 DIAGNOSIS — F0631 Mood disorder due to known physiological condition with depressive features: Secondary | ICD-10-CM

## 2021-08-24 DIAGNOSIS — K559 Vascular disorder of intestine, unspecified: Secondary | ICD-10-CM | POA: Diagnosis present

## 2021-08-24 DIAGNOSIS — Z9221 Personal history of antineoplastic chemotherapy: Secondary | ICD-10-CM

## 2021-08-24 DIAGNOSIS — E876 Hypokalemia: Secondary | ICD-10-CM | POA: Diagnosis present

## 2021-08-24 DIAGNOSIS — K55039 Acute (reversible) ischemia of large intestine, extent unspecified: Secondary | ICD-10-CM | POA: Diagnosis present

## 2021-08-24 HISTORY — DX: Gastro-esophageal reflux disease without esophagitis: K21.9

## 2021-08-24 NOTE — ED Notes (Signed)
Pt extremely hard of hearing.

## 2021-08-24 NOTE — ED Triage Notes (Signed)
Pt arrived via POV c/o abdominal pain and diarrhea since last night. Pt reports abdominal pain moves across lower abdomen. Pt taking OTC Imodium for relief. Pt reports Stage II Colon cancer.

## 2021-08-25 ENCOUNTER — Emergency Department (HOSPITAL_COMMUNITY): Payer: Medicare HMO

## 2021-08-25 DIAGNOSIS — D72829 Elevated white blood cell count, unspecified: Secondary | ICD-10-CM | POA: Diagnosis not present

## 2021-08-25 DIAGNOSIS — K6289 Other specified diseases of anus and rectum: Secondary | ICD-10-CM | POA: Diagnosis not present

## 2021-08-25 DIAGNOSIS — I700 Atherosclerosis of aorta: Secondary | ICD-10-CM | POA: Diagnosis not present

## 2021-08-25 DIAGNOSIS — K55041 Focal (segmental) acute infarction of large intestine: Secondary | ICD-10-CM | POA: Diagnosis not present

## 2021-08-25 DIAGNOSIS — E785 Hyperlipidemia, unspecified: Secondary | ICD-10-CM | POA: Diagnosis not present

## 2021-08-25 DIAGNOSIS — E876 Hypokalemia: Secondary | ICD-10-CM | POA: Diagnosis not present

## 2021-08-25 DIAGNOSIS — Z85048 Personal history of other malignant neoplasm of rectum, rectosigmoid junction, and anus: Secondary | ICD-10-CM | POA: Diagnosis not present

## 2021-08-25 DIAGNOSIS — R109 Unspecified abdominal pain: Secondary | ICD-10-CM | POA: Diagnosis not present

## 2021-08-25 DIAGNOSIS — H9193 Unspecified hearing loss, bilateral: Secondary | ICD-10-CM | POA: Diagnosis not present

## 2021-08-25 DIAGNOSIS — K55039 Acute (reversible) ischemia of large intestine, extent unspecified: Secondary | ICD-10-CM | POA: Diagnosis present

## 2021-08-25 DIAGNOSIS — R69 Illness, unspecified: Secondary | ICD-10-CM | POA: Diagnosis not present

## 2021-08-25 DIAGNOSIS — R54 Age-related physical debility: Secondary | ICD-10-CM | POA: Diagnosis not present

## 2021-08-25 DIAGNOSIS — F17210 Nicotine dependence, cigarettes, uncomplicated: Secondary | ICD-10-CM | POA: Diagnosis not present

## 2021-08-25 DIAGNOSIS — K559 Vascular disorder of intestine, unspecified: Secondary | ICD-10-CM | POA: Diagnosis not present

## 2021-08-25 DIAGNOSIS — K551 Chronic vascular disorders of intestine: Secondary | ICD-10-CM | POA: Diagnosis not present

## 2021-08-25 DIAGNOSIS — Z20822 Contact with and (suspected) exposure to covid-19: Secondary | ICD-10-CM | POA: Diagnosis not present

## 2021-08-25 DIAGNOSIS — Z7189 Other specified counseling: Secondary | ICD-10-CM | POA: Diagnosis not present

## 2021-08-25 DIAGNOSIS — Z515 Encounter for palliative care: Secondary | ICD-10-CM | POA: Diagnosis not present

## 2021-08-25 DIAGNOSIS — R9431 Abnormal electrocardiogram [ECG] [EKG]: Secondary | ICD-10-CM | POA: Diagnosis not present

## 2021-08-25 DIAGNOSIS — Z833 Family history of diabetes mellitus: Secondary | ICD-10-CM | POA: Diagnosis not present

## 2021-08-25 DIAGNOSIS — Z803 Family history of malignant neoplasm of breast: Secondary | ICD-10-CM | POA: Diagnosis not present

## 2021-08-25 DIAGNOSIS — K5900 Constipation, unspecified: Secondary | ICD-10-CM | POA: Diagnosis not present

## 2021-08-25 DIAGNOSIS — Z66 Do not resuscitate: Secondary | ICD-10-CM | POA: Diagnosis not present

## 2021-08-25 DIAGNOSIS — K626 Ulcer of anus and rectum: Secondary | ICD-10-CM | POA: Diagnosis not present

## 2021-08-25 DIAGNOSIS — K55069 Acute infarction of intestine, part and extent unspecified: Secondary | ICD-10-CM | POA: Diagnosis not present

## 2021-08-25 DIAGNOSIS — F32A Depression, unspecified: Secondary | ICD-10-CM | POA: Diagnosis present

## 2021-08-25 DIAGNOSIS — K529 Noninfective gastroenteritis and colitis, unspecified: Secondary | ICD-10-CM | POA: Diagnosis not present

## 2021-08-25 DIAGNOSIS — Z9221 Personal history of antineoplastic chemotherapy: Secondary | ICD-10-CM | POA: Diagnosis not present

## 2021-08-25 DIAGNOSIS — F419 Anxiety disorder, unspecified: Secondary | ICD-10-CM | POA: Diagnosis present

## 2021-08-25 DIAGNOSIS — G4700 Insomnia, unspecified: Secondary | ICD-10-CM | POA: Diagnosis not present

## 2021-08-25 DIAGNOSIS — F0631 Mood disorder due to known physiological condition with depressive features: Secondary | ICD-10-CM | POA: Diagnosis not present

## 2021-08-25 DIAGNOSIS — Z923 Personal history of irradiation: Secondary | ICD-10-CM | POA: Diagnosis not present

## 2021-08-25 DIAGNOSIS — K921 Melena: Secondary | ICD-10-CM | POA: Diagnosis not present

## 2021-08-25 LAB — COMPREHENSIVE METABOLIC PANEL
ALT: 13 U/L (ref 0–44)
AST: 19 U/L (ref 15–41)
Albumin: 3.8 g/dL (ref 3.5–5.0)
Alkaline Phosphatase: 63 U/L (ref 38–126)
Anion gap: 8 (ref 5–15)
BUN: 11 mg/dL (ref 8–23)
CO2: 26 mmol/L (ref 22–32)
Calcium: 9 mg/dL (ref 8.9–10.3)
Chloride: 101 mmol/L (ref 98–111)
Creatinine, Ser: 0.77 mg/dL (ref 0.61–1.24)
GFR, Estimated: >60 mL/min (ref >60)
Glucose, Bld: 109 mg/dL — ABNORMAL HIGH (ref 70–99)
Potassium: 3.6 mmol/L (ref 3.5–5.1)
Sodium: 135 mmol/L (ref 135–145)
Total Bilirubin: 1.1 mg/dL (ref 0.3–1.2)
Total Protein: 7.2 g/dL (ref 6.5–8.1)

## 2021-08-25 LAB — RESP PANEL BY RT-PCR (FLU A&B, COVID) ARPGX2
Influenza A by PCR: NEGATIVE
Influenza B by PCR: NEGATIVE
SARS Coronavirus 2 by RT PCR: NEGATIVE

## 2021-08-25 LAB — URINALYSIS, ROUTINE W REFLEX MICROSCOPIC
Bilirubin Urine: NEGATIVE
Glucose, UA: NEGATIVE mg/dL
Ketones, ur: NEGATIVE mg/dL
Leukocytes,Ua: NEGATIVE
Nitrite: NEGATIVE
Protein, ur: NEGATIVE mg/dL
Specific Gravity, Urine: <1.005 — ABNORMAL LOW (ref 1.005–1.030)
pH: 5 (ref 5.0–8.0)

## 2021-08-25 LAB — URINALYSIS, MICROSCOPIC (REFLEX)

## 2021-08-25 LAB — LIPASE, BLOOD: Lipase: 21 U/L (ref 11–51)

## 2021-08-25 LAB — LACTIC ACID, PLASMA
Lactic Acid, Venous: 1.1 mmol/L (ref 0.5–1.9)
Lactic Acid, Venous: 1 mmol/L (ref 0.5–1.9)

## 2021-08-25 LAB — CBC
HCT: 44.3 % (ref 39.0–52.0)
Hemoglobin: 15.1 g/dL (ref 13.0–17.0)
MCH: 31.2 pg (ref 26.0–34.0)
MCHC: 34.1 g/dL (ref 30.0–36.0)
MCV: 91.5 fL (ref 80.0–100.0)
Platelets: 198 10*3/uL (ref 150–400)
RBC: 4.84 MIL/uL (ref 4.22–5.81)
RDW: 13.7 % (ref 11.5–15.5)
WBC: 15.8 10*3/uL — ABNORMAL HIGH (ref 4.0–10.5)
nRBC: 0 % (ref 0.0–0.2)

## 2021-08-25 LAB — HEPARIN LEVEL (UNFRACTIONATED): Heparin Unfractionated: 0.27 IU/mL — ABNORMAL LOW (ref 0.30–0.70)

## 2021-08-25 MED ORDER — ACETAMINOPHEN 325 MG PO TABS
650.0000 mg | ORAL_TABLET | Freq: Four times a day (QID) | ORAL | Status: DC | PRN
  Administered 2021-09-01: 650 mg via ORAL
  Filled 2021-08-25: qty 2

## 2021-08-25 MED ORDER — PIPERACILLIN-TAZOBACTAM 3.375 G IVPB 30 MIN
3.3750 g | Freq: Once | INTRAVENOUS | Status: AC
  Administered 2021-08-25: 3.375 g via INTRAVENOUS
  Filled 2021-08-25: qty 50

## 2021-08-25 MED ORDER — HEPARIN (PORCINE) 25000 UT/250ML-% IV SOLN
1250.0000 [IU]/h | INTRAVENOUS | Status: AC
  Administered 2021-08-25: 1000 [IU]/h via INTRAVENOUS
  Administered 2021-08-27: 1100 [IU]/h via INTRAVENOUS
  Administered 2021-08-28: 1250 [IU]/h via INTRAVENOUS
  Filled 2021-08-25 (×4): qty 250

## 2021-08-25 MED ORDER — SODIUM CHLORIDE 0.9 % IV BOLUS
500.0000 mL | Freq: Once | INTRAVENOUS | Status: AC
  Administered 2021-08-25: 500 mL via INTRAVENOUS

## 2021-08-25 MED ORDER — ONDANSETRON HCL 4 MG/2ML IJ SOLN
4.0000 mg | Freq: Once | INTRAMUSCULAR | Status: AC
  Administered 2021-08-25: 4 mg via INTRAVENOUS
  Filled 2021-08-25: qty 2

## 2021-08-25 MED ORDER — HEPARIN BOLUS VIA INFUSION
900.0000 [IU] | Freq: Once | INTRAVENOUS | Status: AC
  Administered 2021-08-25: 900 [IU] via INTRAVENOUS
  Filled 2021-08-25: qty 900

## 2021-08-25 MED ORDER — ONDANSETRON HCL 4 MG PO TABS: 4.0000 mg | ORAL_TABLET | Freq: Four times a day (QID) | ORAL | Status: DC | PRN

## 2021-08-25 MED ORDER — ACETAMINOPHEN 650 MG RE SUPP: 650.0000 mg | Freq: Four times a day (QID) | RECTAL | Status: DC | PRN

## 2021-08-25 MED ORDER — SODIUM CHLORIDE 0.9 % IV BOLUS
1000.0000 mL | Freq: Once | INTRAVENOUS | Status: AC
  Administered 2021-08-25: 1000 mL via INTRAVENOUS

## 2021-08-25 MED ORDER — SODIUM CHLORIDE 0.9 % IV SOLN
INTRAVENOUS | Status: DC
  Administered 2021-08-25: 1000 mL via INTRAVENOUS

## 2021-08-25 MED ORDER — HYDROMORPHONE HCL 1 MG/ML IJ SOLN
0.5000 mg | INTRAMUSCULAR | Status: DC | PRN
  Administered 2021-08-25 – 2021-08-27 (×9): 1 mg via INTRAVENOUS
  Administered 2021-08-28 (×3): 0.5 mg via INTRAVENOUS
  Administered 2021-08-29 – 2021-08-30 (×3): 1 mg via INTRAVENOUS
  Administered 2021-08-30: 0.5 mg via INTRAVENOUS
  Administered 2021-08-30 – 2021-09-02 (×5): 1 mg via INTRAVENOUS
  Administered 2021-09-02: 0.5 mg via INTRAVENOUS
  Administered 2021-09-03: 1 mg via INTRAVENOUS
  Filled 2021-08-25 (×5): qty 1
  Filled 2021-08-25: qty 0.5
  Filled 2021-08-25 (×7): qty 1
  Filled 2021-08-25 (×2): qty 0.5
  Filled 2021-08-25 (×2): qty 1
  Filled 2021-08-25: qty 0.5
  Filled 2021-08-25: qty 1
  Filled 2021-08-25: qty 0.5
  Filled 2021-08-25 (×3): qty 1

## 2021-08-25 MED ORDER — PIPERACILLIN-TAZOBACTAM 3.375 G IVPB
3.3750 g | Freq: Three times a day (TID) | INTRAVENOUS | Status: DC
  Administered 2021-08-25 – 2021-08-30 (×15): 3.375 g via INTRAVENOUS
  Filled 2021-08-25 (×15): qty 50

## 2021-08-25 MED ORDER — IOHEXOL 300 MG/ML  SOLN
100.0000 mL | Freq: Once | INTRAMUSCULAR | Status: AC | PRN
  Administered 2021-08-25: 100 mL via INTRAVENOUS

## 2021-08-25 MED ORDER — ENOXAPARIN SODIUM 40 MG/0.4ML IJ SOSY: 40.0000 mg | PREFILLED_SYRINGE | INTRAMUSCULAR | Status: DC

## 2021-08-25 MED ORDER — ONDANSETRON HCL 4 MG/2ML IJ SOLN
4.0000 mg | Freq: Four times a day (QID) | INTRAMUSCULAR | Status: DC | PRN
  Administered 2021-08-26 – 2021-09-02 (×5): 4 mg via INTRAVENOUS
  Filled 2021-08-25 (×4): qty 2

## 2021-08-25 MED ORDER — HYDROMORPHONE HCL 1 MG/ML IJ SOLN
0.5000 mg | Freq: Once | INTRAMUSCULAR | Status: AC
  Administered 2021-08-25: 0.5 mg via INTRAVENOUS
  Filled 2021-08-25: qty 1

## 2021-08-25 MED ORDER — HEPARIN BOLUS VIA INFUSION
3000.0000 [IU] | Freq: Once | INTRAVENOUS | Status: AC
  Administered 2021-08-25: 3000 [IU] via INTRAVENOUS

## 2021-08-25 MED ORDER — ALPRAZOLAM 0.5 MG PO TABS
0.5000 mg | ORAL_TABLET | Freq: Every evening | ORAL | Status: DC | PRN
  Administered 2021-08-26 – 2021-09-04 (×4): 0.5 mg via ORAL
  Filled 2021-08-25 (×5): qty 1

## 2021-08-25 NOTE — Progress Notes (Signed)
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   Medical History: Past Medical History:  Diagnosis Date   Cancer ([place]) 10/21/2017   rectal cancer   Depression    HOH (hard of hearing)    Rectal adenocarcinoma ([place]) 11/07/2017   Stage III, radiation and chemo 2019   Wears dentures    full upper and lower   Wears hearing aid in both ears     Medications:  Medications Prior to Admission  Medication Sig Dispense Refill Last Dose   ALPRAZolam (XANAX) 0.5 MG tablet Take 1 tablet (0.5 mg total) by mouth at bedtime as needed for anxiety. (Patient not taking: Reported on 05/11/2020) 30 tablet 1    potassium chloride SA (K-DUR) 20 MEQ tablet Take 1 tablet (20 mEq total) by mouth daily. (Patient not taking: Reported on 05/11/2020) 14 tablet 0    zolpidem (AMBIEN) 5 MG tablet Take 1-2 tablets (5-10 mg total) by mouth at bedtime as needed for sleep. 15 tablet 0     Assessment: Pharmacy consulted to dose heparin in patient with possible mesenteric stenosis/embolism. Patient is not on anticoagulation prior to admission.  CBC WNL HL 0.27- heparin level drawn early  Goal of Therapy:  Heparin level 0.3-0.7 units/ml Monitor platelets by anticoagulation protocol: Yes   Plan:  Heparin rebolus 900 units x 1 Increase heparin infusion to 1100 units/hr Check anti-Xa level in 8 hours  and daily. Continue to monitor H&H and platelets.  Margot Ables, PharmD Clinical Pharmacist 08/25/2021 5:14 PM

## 2021-08-25 NOTE — Consult Note (Signed)
[facility] Consult  Reason for Consult: Ischemic colitis?  Referring Physician: Dr. Manuella Ghazi / Dr. Sedonia Small   Chief Complaint   Abdominal Pain     HPI: Wesley [name] is a 75 y.o. male with a history of rectal cancer s/p radiation and refusal of any surgery. He has been having worsening lower abdominal pain and says he has had issues with diarrhea and then constipation. He has been having decreased appetite and diarrhea for weeks. He follows with Dr. [facility] and had a repeat CT with possible metastatic lesion in the liver and occluded external right iliac with reconstitution of the artery in August.  He had contrast in the colon an rectum and the wall was overall normal in appearance.   He says he was suppose to have a colonoscopy and he took the medications but did not have a bowel movement. CT today demonstrates a thickened sigmoid colon and a stenosis of the inferior mesenteric vessel at the origin.  No obvious embolus.  The patient is adamant about not wanting any surgery. I talked with him and his sister Maurine about the potential need for surgery if he does not improve and colostomy.  He has all along said he did not surgery or a colostomy bag since he was diagnosed.   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, radiation and chemo 2019   Wears dentures    full upper and lower   Wears hearing aid in both ears     Past Surgical History:  Procedure Laterality Date   CATARACT EXTRACTION W/PHACO Left 05/17/2020   Procedure: CATARACT EXTRACTION PHACO AND INTRAOCULAR LENS PLACEMENT (IOC) LEFT 8.43  00:57.1  14.7%;  Surgeon: Leandrew Koyanagi, MD;  Location: [facility];  Service: Ophthalmology;  Laterality: Left;   CATARACT EXTRACTION W/PHACO Right 07/19/2020   Procedure: CATARACT EXTRACTION PHACO AND INTRAOCULAR LENS  PLACEMENT ([place]) RIGHT MALYUGIN;  Surgeon: Leandrew Koyanagi, MD;  Location: [facility];  Service: Ophthalmology;  Laterality: Right;  12.59, 01:23.4, 15.1%   CHOLECYSTECTOMY     EUS N/A 10/31/2017   Procedure: LOWER ENDOSCOPIC ULTRASOUND (EUS);  Surgeon: Carol Ada, MD;  Location: [facility];  Service: Endoscopy;  Laterality: N/A;    Family History  Problem Relation Age of Onset   Cancer Mother        Kidney   Diabetes Mother    Cancer Paternal Uncle        colon   Cancer Paternal Aunt 93       breast cancer   Hyperlipidemia Neg Hx    Hypertension Neg Hx    Stroke Neg Hx     Social History   Tobacco Use   Smoking status: Every Day    Packs/day: 1.00    Years: 56.00    Pack years: 56.00    Types: Cigarettes   Smokeless tobacco: Never   Tobacco comments:    started smoking age 73  Vaping Use   Vaping Use: Never used  Substance Use Topics   Alcohol use: No    Alcohol/week: 0.0 standard drinks   Drug use: No    Medications: I have reviewed the patient's current medications. Prior to Admission: (Not in a hospital admission)  Scheduled: Continuous:  sodium chloride 1,000 mL (08/25/21 0801)   heparin 1,000 Units/hr (08/25/21 0921)  piperacillin-tazobactam (ZOSYN)  IV     BDZ:HGDJMEQASTMHD **OR** acetaminophen, ALPRAZolam, HYDROmorphone (DILAUDID) injection, ondansetron **OR** ondansetron (ZOFRAN) IV  No Known Allergies   ROS:  A comprehensive review of systems was negative except for: Gastrointestinal: positive for abdominal pain, constipation, and diarrhea  Blood pressure 114/63, pulse 78, temperature 98.1 F (36.7 C), temperature source Oral, resp. rate (!) 21, height 5\' 6"  (1.676 m), weight 60 kg, SpO2 91 %. Physical Exam Vitals reviewed.  Constitutional:      Appearance: He is well-developed.  HENT:     Head: Normocephalic.  Eyes:     Extraocular Movements: Extraocular movements intact.  Cardiovascular:     Rate and Rhythm: Normal rate  and regular rhythm.  Pulmonary:     Effort: Pulmonary effort is normal.  Abdominal:     General: There is no distension.     Palpations: Abdomen is soft.     Tenderness: There is abdominal tenderness in the suprapubic area. There is no guarding or rebound.  Skin:    General: Skin is warm.  Neurological:     General: No focal deficit present.     Mental Status: He is alert and oriented to person, place, and time.  Psychiatric:        Mood and Affect: Mood normal.        Behavior: Behavior normal.    Results: Results for orders placed or performed during the hospital encounter of 08/24/21 (from the past 48 hour(s))  CBC     Status: Abnormal   Collection Time: 08/25/21  1:00 AM  Result Value Ref Range   WBC 15.8 (H) 4.0 - 10.5 K/uL   RBC 4.84 4.22 - 5.81 MIL/uL   Hemoglobin 15.1 13.0 - 17.0 g/dL   HCT 44.3 39.0 - 52.0 %   MCV 91.5 80.0 - 100.0 fL   MCH 31.2 26.0 - 34.0 pg   MCHC 34.1 30.0 - 36.0 g/dL   RDW 13.7 11.5 - 15.5 %   Platelets 198 150 - 400 K/uL   nRBC 0.0 0.0 - 0.2 %    Comment: Performed at [facility], [address] [place], [place] [address]  Comprehensive metabolic panel     Status: Abnormal   Collection Time: 08/25/21  1:00 AM  Result Value Ref Range   Sodium 135 135 - 145 mmol/L   Potassium 3.6 3.5 - 5.1 mmol/L   Chloride 101 98 - 111 mmol/L   CO2 26 22 - 32 mmol/L   Glucose, Bld 109 (H) 70 - 99 mg/dL    Comment: Glucose reference range applies only to samples taken after fasting for at least 8 hours.   BUN 11 8 - 23 mg/dL   Creatinine, Ser 0.77 0.61 - 1.24 mg/dL   Calcium 9.0 8.9 - 10.3 mg/dL   Total Protein 7.2 6.5 - 8.1 g/dL   Albumin 3.8 3.5 - 5.0 g/dL   AST 19 15 - 41 U/L   ALT 13 0 - 44 U/L   Alkaline Phosphatase 63 38 - 126 U/L   Total Bilirubin 1.1 0.3 - 1.2 mg/dL   GFR, Estimated >60 >60 mL/min    Comment: (NOTE) Calculated using the CKD-EPI Creatinine Equation (2021)    Anion gap 8 5 - 15    Comment: Performed at [facility] [place],  [address], [place], [place] [address]  Lipase, blood     Status: None   Collection Time: 08/25/21  1:00 AM  Result Value Ref Range   Lipase 21  11 - 51 U/L    Comment: Performed at [facility], [address] [place], [place] [address]  Urinalysis, Routine w reflex microscopic Urine, Clean Catch     Status: Abnormal   Collection Time: 08/25/21  5:00 AM  Result Value Ref Range   Color, Urine YELLOW YELLOW   APPearance CLEAR CLEAR   Specific Gravity, Urine <1.005 (L) 1.005 - 1.030   pH 5.0 5.0 - 8.0   Glucose, UA NEGATIVE NEGATIVE mg/dL   Hgb urine dipstick SMALL (A) NEGATIVE   Bilirubin Urine NEGATIVE NEGATIVE   Ketones, ur NEGATIVE NEGATIVE mg/dL   Protein, ur NEGATIVE NEGATIVE mg/dL   Nitrite NEGATIVE NEGATIVE   Leukocytes,Ua NEGATIVE NEGATIVE    Comment: Performed at [facility], [address], [place], [place] [address]  Urinalysis, Microscopic (reflex)     Status: Abnormal   Collection Time: 08/25/21  5:00 AM  Result Value Ref Range   RBC / HPF 6-10 0 - 5 RBC/hpf   WBC, UA 0-5 0 - 5 WBC/hpf   Bacteria, UA RARE (A) NONE SEEN   Squamous Epithelial / LPF 0-5 0 - 5    Comment: Performed at [facility], [address], [place],  [address]  Resp Panel by RT-PCR (Flu A&B, Covid) Nasopharyngeal Swab     Status: None   Collection Time: 08/25/21  6:06 AM   Specimen: Nasopharyngeal Swab; Nasopharyngeal(NP) swabs in vial transport medium  Result Value Ref Range   SARS Coronavirus 2 by RT PCR NEGATIVE NEGATIVE    Comment: (NOTE) SARS-CoV-2 target nucleic acids are NOT DETECTED.  The SARS-CoV-2 RNA is generally detectable in upper respiratory specimens during the acute phase of infection. The lowest concentration of SARS-CoV-2 viral copies this assay can detect is 138 copies/mL. A negative result does not preclude SARS-Cov-2 infection and should not be used as the sole basis for treatment or other patient management decisions. A negative result may occur with  improper specimen  collection/handling, submission of specimen other than nasopharyngeal swab, presence of viral mutation(s) within the areas targeted by this assay, and inadequate number of viral copies(<138 copies/mL). A negative result must be combined with clinical observations, patient history, and epidemiological information. The expected result is Negative.  Fact Sheet for Patients:  EntrepreneurPulse.com.au  Fact Sheet for Healthcare Providers:  IncredibleEmployment.be  This test is no t yet approved or cleared by the [place] FDA and  has been authorized for detection and/or diagnosis of SARS-CoV-2 by FDA under an Emergency Use Authorization (EUA). This EUA will remain  in effect (meaning this test can be used) for the duration of the COVID-19 declaration under Section 564(b)(1) of the Act, 21 U.S.C.section 360bbb-3(b)(1), unless the authorization is terminated  or revoked sooner.       Influenza A by PCR NEGATIVE NEGATIVE   Influenza B by PCR NEGATIVE NEGATIVE    Comment: (NOTE) The Xpert Xpress SARS-CoV-2/FLU/RSV plus assay is intended as an aid in the diagnosis of influenza from Nasopharyngeal swab specimens and should not be used as a sole basis for treatment. Nasal washings and aspirates are unacceptable for Xpert Xpress SARS-CoV-2/FLU/RSV testing.  Fact Sheet for Patients: EntrepreneurPulse.com.au  Fact Sheet for Healthcare Providers: IncredibleEmployment.be  This test is not yet approved or cleared by the [place] FDA and has been authorized for detection and/or diagnosis of SARS-CoV-2 by FDA under an Emergency Use Authorization (EUA). This EUA will remain in effect (meaning this test can be used) for the duration of the COVID-19 declaration  under Section 564(b)(1) of the Act, 21 U.S.C. section 360bbb-3(b)(1), unless the authorization is terminated or revoked.  Performed at [facility],  [address] [place], [place] [address]   Lactic acid, plasma     Status: None   Collection Time: 08/25/21  7:52 AM  Result Value Ref Range   Lactic Acid, Venous 1.1 0.5 - 1.9 mmol/L    Comment: Performed at [facility], [address] [place], [place] [address]   Personally reviewed- signs of thickened sigmoid colon and colitis, no free air, compared to CT 07/2021 bowel wall looks thicker, has prior iliac occlusion and high grade stenosis of the IMA CT ABDOMEN PELVIS W CONTRAST  Result Date: 08/25/2021 CLINICAL DATA:  Lower abdominal pain, diarrhea. Stage II colon cancer. EXAM: CT ABDOMEN AND PELVIS WITH CONTRAST TECHNIQUE: Multidetector CT imaging of the abdomen and pelvis was performed using the standard protocol following bolus administration of intravenous contrast. CONTRAST:  1110mL OMNIPAQUE IOHEXOL 300 MG/ML  SOLN COMPARISON:  07/12/2021 FINDINGS: Lower chest: Bibasilar atelectasis. Mild coronary artery calcification. Global cardiac size within normal limits. Hepatobiliary: Status post cholecystectomy. Mild intra and extrahepatic biliary ductal dilation may simply represent post cholecystectomy change. Liver otherwise unremarkable. Pancreas: Unremarkable Spleen: Unremarkable Adrenals/Urinary Tract: The kidneys are normal in size and position. Simple cortical cyst arises exophytically from the lateral interpolar region of the right kidney. Stable 3 mm nonobstructing calculus within the interpolar region of the left kidney. The kidneys are otherwise unremarkable. The bladder is unremarkable. Stomach/Bowel: There is a roughly 5 cm length segment of ischemic bowel involving the terminal sigmoid colon with mucosal non enhancement, best seen on image # 68/2. There is no associated pneumatosis or extraluminal gas to suggest perforation. The remainder of the small and large bowel demonstrates normal enhancement. Given its focality and somewhat unusual location, differential should include the sequela of  arterial embolization or radiation colitis in the appropriate clinical setting. The stomach, small bowel, and large bowel are otherwise unremarkable. No evidence of obstruction. Appendix normal. No free intraperitoneal gas or fluid. Vascular/Lymphatic: There is extensive mixed atherosclerotic plaque throughout the infrarenal abdominal aorta and iliofemoral arterial vasculature. There is a high-grade focal stenosis of the inferior mesenteric artery at its origin, best seen on sagittal image # 47/5. There is segmental occlusion of the right common and proximal external iliac arteries with reconstitution of the right common femoral artery via the epigastric arcade. Left-sided IVC present. No pathologic adenopathy within the abdomen and pelvis. Reproductive: Prostate is unremarkable. Other: No abdominal wall hernia. Musculoskeletal: No acute bone abnormality. No lytic or blastic bone lesion. IMPRESSION: Short-segment ischemic colitis involving the terminal sigmoid colon. Given the relative focality and unusual location, additional considerations should include arterial embolism and radiation colitis in the appropriate clinical setting (i.e. Was this segment of bowel within the treatment zone). Mild coronary artery calcification. Minimal nonobstructing left nephrolithiasis. Status post cholecystectomy with probable post cholecystectomy change involving the biliary tree. Variant anatomy with left-sided IVC. Peripheral vascular disease with high-grade stenosis of the inferior mesenteric artery and segmental occlusion of the right lower extremity arterial inflow with reconstitution via the epigastric arcade. Aortic Atherosclerosis (ICD10-I70.0). Electronically Signed   By: Fidela Salisbury M.D.   On: 08/25/2021 02:51     Assessment & Plan:  SIMCHA SPEIR is a 75 y.o. male with stenosis of the IMA, signs of what is likely ischemic colitis given that he had rectal cancer and radiation and this is higher and CT from  07/2021 there  is more normal wall thickness. He really does not want to do any surgery if needed initially. Discussed with him and his sister that if he worsened and needed surgery that if he opted not proceed with colectomy and colostomy, it would ultimately lead to death. Discussed that if he did not want any surgery given the potential need in the upcoming days if things worsen, then he will need to be DNR and plans for comfort care.   He is going to talk to his sister and make a decision if he would accept surgery if needed. Discussed potential need for colonoscopy.  Have sent IR a message regarding any stent or angioplasty that could be possible for the stenosis, no obvious embolus, he has been having issues with diarrhea and I wonder if this could be subacute process  Heparin gtt started Antibiotics NPO   All questions were answered to the satisfaction of the patient and family. Discussed with team.   Labs reassuring with normal lactic acid.     Virl Cagey 08/25/2021, 9:33 AM

## 2021-08-25 NOTE — Progress Notes (Signed)
[facility]  Abdomen still soft, tender in LLQ, suprapubic, no rebound or guarding. Sister at bedside.  If he had to get surgery he would and understands that would be colostomy.  Sent IR a message about IMA stenosis, awaiting to hear back. A lot of people have occluded IMA and are fine with collateralization, so I am not sure if they would be willing or able to do anything.   Curlene Labrum, MD [facility] [address] [place],  [address] [phone] (office)

## 2021-08-25 NOTE — Progress Notes (Signed)
Pharmacy Antibiotic Note  Wesley [name] is a 75 y.o. male admitted on 08/24/2021 with  intra-abdominal infection .  Pharmacy has been consulted for Zosyn dosing.  Plan: Zosyn 3.375g IV q8h (4 hour infusion).  Height: 5\' 6"  (167.6 cm) Weight: 60 kg (132 lb 4.4 oz) IBW/kg (Calculated) : 63.8  Temp (24hrs), Avg:98.1 F (36.7 C), Min:98.1 F (36.7 C), Max:98.1 F (36.7 C)  Recent Labs  Lab 08/25/21 0100  WBC 15.8*  CREATININE 0.77    Estimated Creatinine Clearance: 67.7 mL/min (by C-G formula based on SCr of 0.77 mg/dL).    No Known Allergies  Antimicrobials this admission: Zosyn 9/24 >>     Microbiology results: None pending  Thank you for allowing pharmacy to be a part of this patient's care.  Ramond Craver 08/25/2021 8:13 AM

## 2021-08-25 NOTE — Progress Notes (Signed)
ANTICOAGULATION CONSULT NOTE - Initial Consult  Pharmacy Consult for heparin Indication: possible meseternic stenosis, embolism, ischemic colitis  No Known Allergies  Patient Measurements: Height: 5\' 6"  (167.6 cm) Weight: 60 kg (132 lb 4.4 oz) IBW/kg (Calculated) : 63.8 Heparin Dosing Weight: 60 kg  Vital Signs: BP: 114/63 (09/24 0630) Pulse Rate: 78 (09/24 0630)  Labs: Recent Labs    08/25/21 0100  HGB 15.1  HCT 44.3  PLT 198  CREATININE 0.77    Estimated Creatinine Clearance: 67.7 mL/min (by C-G formula based on SCr of 0.77 mg/dL).   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 Patient is not on anticoagulation prior to admission.  CBC WNL  Goal of Therapy:  Heparin level 0.3-0.7 units/ml Monitor platelets by anticoagulation protocol: Yes   Plan:  Give 3000 units bolus x 1 Start heparin infusion at 1000 units/hr Check anti-Xa level in 8 hours and daily while on heparin Continue to monitor H&H and platelets  Margot Ables, PharmD Clinical Pharmacist 08/25/2021 8:51 AM

## 2021-08-25 NOTE — H&P (Signed)
History and Physical    SEIBERT KEETER KKX:[id number] DOB: 12/13/1945 DOA: 08/24/2021  PCP: Jearld Fenton, NP   Patient coming from: Home  Chief Complaint: Lower abdominal pain  HPI: Wesley [name] is a 75 y.o. male with medical history significant for rectal adenocarcinoma with prior chemoradiation, ongoing tobacco abuse, and anxiety disorder who presented to the ED with complaints of sudden onset lower abdominal pain that began approximately 2 days ago after he had a bowel movement.  He denies any particular alleviating or aggravating factors.  He describes the pain as dull, achy, and constant.  He states that over the last 1 week he has had a greater amount of rectal bleeding than usual when he goes to wipe.  He denies any nausea, vomiting, change in bowel habit, fevers, or chills.   ED Course: Stable vital signs noted and he is noted to have leukocytosis of 15,800.  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  EDP has discussed case with general surgery with recommendations to continue n.p.o. status with IV fluid and Zosyn for now.  Review of Systems: Reviewed as noted above, otherwise negative.  Past Medical History:  Diagnosis Date   Cancer ([place]) 10/21/2017   rectal cancer   Depression    HOH (hard of hearing)    Rectal adenocarcinoma ([place]) 11/07/2017   Stage III, radiation and chemo 2019   Wears dentures    full upper and lower   Wears hearing aid in both ears     Past Surgical History:  Procedure Laterality Date   CATARACT EXTRACTION W/PHACO Left 05/17/2020   Procedure: CATARACT EXTRACTION PHACO AND INTRAOCULAR LENS PLACEMENT (IOC) LEFT 8.43  00:57.1  14.7%;  Surgeon: Leandrew Koyanagi, MD;  Location: [place];  Service: Ophthalmology;  Laterality: Left;   CATARACT EXTRACTION W/PHACO Right 07/19/2020   Procedure: CATARACT EXTRACTION PHACO AND INTRAOCULAR LENS PLACEMENT ([place]) RIGHT  MALYUGIN;  Surgeon: Leandrew Koyanagi, MD;  Location: [place];  Service: Ophthalmology;  Laterality: Right;  12.59, 01:23.4, 15.1%   CHOLECYSTECTOMY     EUS N/A 10/31/2017   Procedure: LOWER ENDOSCOPIC ULTRASOUND (EUS);  Surgeon: Carol Ada, MD;  Location: [facility];  Service: Endoscopy;  Laterality: N/A;     reports that he has been smoking cigarettes. He has a 56.00 pack-year smoking history. He has never used smokeless tobacco. He reports that he does not drink alcohol and does not use drugs.  No Known Allergies  Family History  Problem Relation Age of Onset   Cancer Mother        Kidney   Diabetes Mother    Cancer Paternal Uncle        colon   Cancer Paternal Aunt 63       breast cancer   Hyperlipidemia Neg Hx    Hypertension Neg Hx    Stroke Neg Hx     Prior to Admission medications   Medication Sig Start Date End Date Taking? Authorizing Provider  ALPRAZolam Duanne Moron) 0.5 MG tablet Take 1 tablet (0.5 mg total) by mouth at bedtime as needed for anxiety. Patient not taking: Reported on 05/11/2020 06/11/19   Truitt Merle, MD  potassium chloride SA (K-DUR) 20 MEQ tablet Take 1 tablet (20 mEq total) by mouth daily. Patient not taking: Reported on 05/11/2020 06/11/19   Truitt Merle, MD  zolpidem (AMBIEN) 5 MG tablet Take 1-2 tablets (5-10 mg total) by mouth at bedtime as needed for sleep. 01/31/21  Truitt Merle, MD    Physical Exam: Vitals:   08/25/21 0500 08/25/21 0530 08/25/21 0600 08/25/21 0630  BP: 114/74 (!) 114/57 (!) 101/48 114/63  Pulse: 65 (!) 58 71 78  Resp: (!) 24 (!) 21 17 (!) 21  Temp:      TempSrc:      SpO2: 94% 93% 91% 91%  Weight:      Height:        Constitutional: NAD, calm, comfortable, hard of hearing Vitals:   08/25/21 0500 08/25/21 0530 08/25/21 0600 08/25/21 0630  BP: 114/74 (!) 114/57 (!) 101/48 114/63  Pulse: 65 (!) 58 71 78  Resp: (!) 24 (!) 21 17 (!) 21  Temp:      TempSrc:      SpO2: 94% 93% 91% 91%  Weight:      Height:        Eyes: lids and conjunctivae normal Neck: normal, supple Respiratory: clear to auscultation bilaterally. Normal respiratory effort. No accessory muscle use.  Cardiovascular: Regular rate and rhythm, no murmurs. Abdomen: Mild tenderness to palpation over the upper quadrants and significant tenderness to palpation over the lower, no distention. Bowel sounds positive.  Musculoskeletal:  No edema. Skin: no rashes, lesions, ulcers.  Psychiatric: Flat affect  Labs on Admission: I have personally reviewed following labs and imaging studies  CBC: Recent Labs  Lab 08/25/21 0100  WBC 15.8*  HGB 15.1  HCT 44.3  MCV 91.5  PLT 387   Basic Metabolic Panel: Recent Labs  Lab 08/25/21 0100  NA 135  K 3.6  CL 101  CO2 26  GLUCOSE 109*  BUN 11  CREATININE 0.77  CALCIUM 9.0   GFR: Estimated Creatinine Clearance: 67.7 mL/min (by C-G formula based on SCr of 0.77 mg/dL). Liver Function Tests: Recent Labs  Lab 08/25/21 0100  AST 19  ALT 13  ALKPHOS 63  BILITOT 1.1  PROT 7.2  ALBUMIN 3.8   Recent Labs  Lab 08/25/21 0100  LIPASE 21   No results for input(s): AMMONIA in the last 168 hours. Coagulation Profile: No results for input(s): INR, PROTIME in the last 168 hours. Cardiac Enzymes: No results for input(s): CKTOTAL, CKMB, CKMBINDEX, TROPONINI in the last 168 hours. BNP (last 3 results) No results for input(s): PROBNP in the last 8760 hours. HbA1C: No results for input(s): HGBA1C in the last 72 hours. CBG: No results for input(s): GLUCAP in the last 168 hours. Lipid Profile: No results for input(s): CHOL, HDL, LDLCALC, TRIG, CHOLHDL, LDLDIRECT in the last 72 hours. Thyroid Function Tests: No results for input(s): TSH, T4TOTAL, FREET4, T3FREE, THYROIDAB in the last 72 hours. Anemia Panel: No results for input(s): VITAMINB12, FOLATE, FERRITIN, TIBC, IRON, RETICCTPCT in the last 72 hours. Urine analysis:    Component Value Date/Time   COLORURINE YELLOW 08/25/2021 0500    APPEARANCEUR CLEAR 08/25/2021 0500   LABSPEC <1.005 (L) 08/25/2021 0500   PHURINE 5.0 08/25/2021 0500   GLUCOSEU NEGATIVE 08/25/2021 0500   HGBUR SMALL (A) 08/25/2021 0500   BILIRUBINUR NEGATIVE 08/25/2021 0500   KETONESUR NEGATIVE 08/25/2021 0500   PROTEINUR NEGATIVE 08/25/2021 0500   NITRITE NEGATIVE 08/25/2021 0500   LEUKOCYTESUR NEGATIVE 08/25/2021 0500    Radiological Exams on Admission: CT ABDOMEN PELVIS W CONTRAST  Result Date: 08/25/2021 CLINICAL DATA:  Lower abdominal pain, diarrhea. Stage II colon cancer. EXAM: CT ABDOMEN AND PELVIS WITH CONTRAST TECHNIQUE: Multidetector CT imaging of the abdomen and pelvis was performed using the standard protocol following bolus administration of intravenous contrast.  CONTRAST:  112mL OMNIPAQUE IOHEXOL 300 MG/ML  SOLN COMPARISON:  07/12/2021 FINDINGS: Lower chest: Bibasilar atelectasis. Mild coronary artery calcification. Global cardiac size within normal limits. Hepatobiliary: Status post cholecystectomy. Mild intra and extrahepatic biliary ductal dilation may simply represent post cholecystectomy change. Liver otherwise unremarkable. Pancreas: Unremarkable Spleen: Unremarkable Adrenals/Urinary Tract: The kidneys are normal in size and position. Simple cortical cyst arises exophytically from the lateral interpolar region of the right kidney. Stable 3 mm nonobstructing calculus within the interpolar region of the left kidney. The kidneys are otherwise unremarkable. The bladder is unremarkable. Stomach/Bowel: There is a roughly 5 cm length segment of ischemic bowel involving the terminal sigmoid colon with mucosal non enhancement, best seen on image # 68/2. There is no associated pneumatosis or extraluminal gas to suggest perforation. The remainder of the small and large bowel demonstrates normal enhancement. Given its focality and somewhat unusual location, differential should include the sequela of arterial embolization or radiation colitis in the  appropriate clinical setting. The stomach, small bowel, and large bowel are otherwise unremarkable. No evidence of obstruction. Appendix normal. No free intraperitoneal gas or fluid. Vascular/Lymphatic: There is extensive mixed atherosclerotic plaque throughout the infrarenal abdominal aorta and iliofemoral arterial vasculature. There is a high-grade focal stenosis of the inferior mesenteric artery at its origin, best seen on sagittal image # 47/5. There is segmental occlusion of the right common and proximal external iliac arteries with reconstitution of the right common femoral artery via the epigastric arcade. Left-sided IVC present. No pathologic adenopathy within the abdomen and pelvis. Reproductive: Prostate is unremarkable. Other: No abdominal wall hernia. Musculoskeletal: No acute bone abnormality. No lytic or blastic bone lesion. IMPRESSION: Short-segment ischemic colitis involving the terminal sigmoid colon. Given the relative focality and unusual location, additional considerations should include arterial embolism and radiation colitis in the appropriate clinical setting (i.e. Was this segment of bowel within the treatment zone). Mild coronary artery calcification. Minimal nonobstructing left nephrolithiasis. Status post cholecystectomy with probable post cholecystectomy change involving the biliary tree. Variant anatomy with left-sided IVC. Peripheral vascular disease with high-grade stenosis of the inferior mesenteric artery and segmental occlusion of the right lower extremity arterial inflow with reconstitution via the epigastric arcade. Aortic Atherosclerosis (ICD10-I70.0). Electronically Signed   By: Fidela Salisbury M.D.   On: 08/25/2021 02:51    EKG: Independently reviewed. SR 73bpm.  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 status until further general surgery evaluation -Continue to monitor leukocytosis with repeat labs -Lactic acid ordered -Continue IV fluid -IV Zosyn  Tobacco abuse -Currently 1 pack/day -Nicotine patch offered -Counseled on cessation  Anxiety disorder/insomnia -Xanax at bedtime  History of rectal adenocarcinoma -Currently under surveillance   DVT prophylaxis: Lovenox Code Status: Full Family Communication: None at bedside Disposition Plan:Admit for GS evaluation Consults called:GS Admission status: Inpatient, MedSurg  Menaal Russum D Alexsia Klindt DO [facility]  If 7PM-7AM, please contact night-coverage [email or web address]  08/25/2021, 7:45 AM

## 2021-08-25 NOTE — ED Provider Notes (Signed)
[facility] Emergency Department Provider Note MRN:  [id number]  Arrival date & time: 08/25/21     Chief Complaint   Abdominal Pain   History of Present Illness   Wesley [name] is a 75 y.o. year-old male with a history of colon cancer presenting to the ED with chief complaint of abdominal pain.  Location: Diffuse Duration: 2 or 3 days Onset: Gradual Timing: Constant pain Description: Dull Severity: Moderate to severe Exacerbating/Alleviating Factors: None Associated Symptoms: Nausea, decreased bowel movements Pertinent Negatives: No fever, no vomiting, no chest pain, shortness of breath  Additional History: Patient's cancer doctors recommended a colostomy but he refused  Review of Systems  A complete 10 system review of systems was obtained and all systems are negative except as noted in the HPI and PMH.   Patient's Health History    Past Medical History:  Diagnosis Date   Cancer ([place]) 10/21/2017   rectal cancer   Depression    HOH (hard of hearing)    Rectal adenocarcinoma ([place]) 11/07/2017   Stage III, radiation and chemo 2019   Wears dentures    full upper and lower   Wears hearing aid in both ears     Past Surgical History:  Procedure Laterality Date   CATARACT EXTRACTION W/PHACO Left 05/17/2020   Procedure: CATARACT EXTRACTION PHACO AND INTRAOCULAR LENS PLACEMENT (IOC) LEFT 8.43  00:57.1  14.7%;  Surgeon: Leandrew Koyanagi, MD;  Location: [place];  Service: Ophthalmology;  Laterality: Left;   CATARACT EXTRACTION W/PHACO Right 07/19/2020   Procedure: CATARACT EXTRACTION PHACO AND INTRAOCULAR LENS PLACEMENT ([place]) RIGHT MALYUGIN;  Surgeon: Leandrew Koyanagi, MD;  Location: [place];  Service: Ophthalmology;  Laterality: Right;  12.59, 01:23.4, 15.1%   CHOLECYSTECTOMY     EUS N/A 10/31/2017   Procedure: LOWER ENDOSCOPIC ULTRASOUND (EUS);  Surgeon: Carol Ada, MD;  Location: [facility];  Service: Endoscopy;   Laterality: N/A;    Family History  Problem Relation Age of Onset   Cancer Mother        Kidney   Diabetes Mother    Cancer Paternal Uncle        colon   Cancer Paternal Aunt 67       breast cancer   Hyperlipidemia Neg Hx    Hypertension Neg Hx    Stroke Neg Hx     Social History   Socioeconomic History   Marital status: Widowed    Spouse name: Not on file   Number of children: 3   Years of education: Not on file   Highest education level: Not on file  Occupational History   Occupation: truck driver  Tobacco Use   Smoking status: Every Day    Packs/day: 1.00    Years: 56.00    Pack years: 56.00    Types: Cigarettes   Smokeless tobacco: Never   Tobacco comments:    started smoking age 30  Vaping Use   Vaping Use: Never used  Substance and Sexual Activity   Alcohol use: No    Alcohol/week: 0.0 standard drinks   Drug use: No   Sexual activity: Not Currently  Other Topics Concern   Not on file  Social History Narrative   Not on file   Social Determinants of Health   Financial Resource Strain: Not on file  Food Insecurity: Not on file  Transportation Needs: Not on file  Physical Activity: Not on file  Stress: Not on file  Social Connections: Not on file  Intimate Partner Violence: Not on file     Physical Exam   Vitals:   08/25/21 0600 08/25/21 0630  BP: (!) 101/48 114/63  Pulse: 71 78  Resp: 17 (!) 21  Temp:    SpO2: 91% 91%    CONSTITUTIONAL: Well-appearing, NAD NEURO:  Alert and oriented x 3, no focal deficits EYES:  eyes equal and reactive ENT/NECK:  no LAD, no JVD CARDIO: Regular rate, well-perfused, normal S1 and S2 PULM:  CTAB no wheezing or rhonchi GI/GU:  normal bowel sounds, non-distended, non-tender MSK/SPINE:  No gross deformities, no edema SKIN:  no rash, atraumatic PSYCH:  Appropriate speech and behavior  *Additional and/or pertinent findings included in MDM below  Diagnostic and Interventional Summary    EKG  Interpretation  Date/Time:    Ventricular Rate:    PR Interval:    QRS Duration:   QT Interval:    QTC Calculation:   R Axis:     Text Interpretation:         Labs Reviewed  CBC - Abnormal; Notable for the following components:      Result Value   WBC 15.8 (*)    All other components within normal limits  COMPREHENSIVE METABOLIC PANEL - Abnormal; Notable for the following components:   Glucose, Bld 109 (*)    All other components within normal limits  URINALYSIS, ROUTINE W REFLEX MICROSCOPIC - Abnormal; Notable for the following components:   Specific Gravity, Urine <1.005 (*)    Hgb urine dipstick SMALL (*)    All other components within normal limits  URINALYSIS, MICROSCOPIC (REFLEX) - Abnormal; Notable for the following components:   Bacteria, UA RARE (*)    All other components within normal limits  RESP PANEL BY RT-PCR (FLU A&B, COVID) ARPGX2  LIPASE, BLOOD    CT ABDOMEN PELVIS W CONTRAST  Final Result      Medications  sodium chloride 0.9 % bolus 500 mL (0 mLs Intravenous Stopped 08/25/21 0226)  HYDROmorphone (DILAUDID) injection 0.5 mg (0.5 mg Intravenous Given 08/25/21 0059)  ondansetron (ZOFRAN) injection 4 mg (4 mg Intravenous Given 08/25/21 0059)  iohexol (OMNIPAQUE) 300 MG/ML solution 100 mL (100 mLs Intravenous Contrast Given 08/25/21 0204)  piperacillin-tazobactam (ZOSYN) IVPB 3.375 g (0 g Intravenous Stopped 08/25/21 0604)  sodium chloride 0.9 % bolus 1,000 mL (1,000 mLs Intravenous New Bag/Given 08/25/21 0603)     Procedures  /  Critical Care .Critical Care Performed by: Maudie Flakes, MD Authorized by: Maudie Flakes, MD   Critical care provider statement:    Critical care time (minutes):  45   Critical care was necessary to treat or prevent imminent or life-threatening deterioration of the following conditions: ischemic colitis.   Critical care was time spent personally by me on the following activities:  Discussions with consultants, evaluation of  patient's response to treatment, examination of patient, ordering and performing treatments and interventions, ordering and review of laboratory studies, ordering and review of radiographic studies, pulse oximetry, re-evaluation of patient's condition, obtaining history from patient or surrogate and review of old charts  ED Course and Medical Decision Making  I have reviewed the triage vital signs, the nursing notes, and pertinent available records from the EMR.  Listed above are laboratory and imaging tests that I personally ordered, reviewed, and interpreted and then considered in my medical decision making (see below for details).  Concern for obstruction or complication related to underlying colon cancer.  Also considering perforated viscus, appendicitis, diverticulitis.  Labs, CT  pending.     CT concerning for ischemic colitis, could also be due to radiation colitis.  Discussed case with Dr. Constance Haw of general surgery, providing fluids, IV Zosyn, n.p.o., will admit to medicine.  Barth Kirks. Sedonia Small, [place] [email or web address]  Final Clinical Impressions(s) / ED Diagnoses     ICD-10-CM   1. Ischemic colitis ([place])  K55.9       ED Discharge Orders     None        Discharge Instructions Discussed with and Provided to Patient:   Discharge Instructions   None       Maudie Flakes, MD 08/25/21 [phone]

## 2021-08-25 NOTE — Progress Notes (Signed)
Patient has made mention of wanting to "be done with life" and has said "I wish you would give me enough pain medicine so I could go to sleep and never wake up" in front of this nurse. Patient's sister and daughter spoke with this nurse and also expressed concern for patient. They state that he has been negative since he was a child, but has gotten much worse since the unexpected death of his wife. Patient is not on any antidepressant. Concerned for patient's mental health, this RN reached out to Dr. Manuella Ghazi and requested a psych consult at this time.

## 2021-08-25 NOTE — H&P (View-Only) (Signed)
[facility] Consult  Reason for Consult: Ischemic colitis?  Referring Physician: Dr. Manuella Ghazi / Dr. Sedonia Small   Chief Complaint   Abdominal Pain     HPI: Wesley [name] is a 75 y.o. male with a history of rectal cancer s/p radiation and refusal of any surgery. He has been having worsening lower abdominal pain and says he has had issues with diarrhea and then constipation. He has been having decreased appetite and diarrhea for weeks. He follows with Dr. [facility] and had a repeat CT with possible metastatic lesion in the liver and occluded external right iliac with reconstitution of the artery in August.  He had contrast in the colon an rectum and the wall was overall normal in appearance.   He says he was suppose to have a colonoscopy and he took the medications but did not have a bowel movement. CT today demonstrates a thickened sigmoid colon and a stenosis of the inferior mesenteric vessel at the origin.  No obvious embolus.  The patient is adamant about not wanting any surgery. I talked with him and his sister Wesley [name] about the potential need for surgery if he does not improve and colostomy.  He has all along said he did not surgery or a colostomy bag since he was diagnosed.   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, radiation and chemo 2019   Wears dentures    full upper and lower   Wears hearing aid in both ears     Past Surgical History:  Procedure Laterality Date   CATARACT EXTRACTION W/PHACO Left 05/17/2020   Procedure: CATARACT EXTRACTION PHACO AND INTRAOCULAR LENS PLACEMENT (IOC) LEFT 8.43  00:57.1  14.7%;  Surgeon: Leandrew Koyanagi, MD;  Location: [place];  Service: Ophthalmology;  Laterality: Left;   CATARACT EXTRACTION W/PHACO Right 07/19/2020   Procedure: CATARACT EXTRACTION PHACO AND INTRAOCULAR LENS  PLACEMENT ([place]) RIGHT MALYUGIN;  Surgeon: Leandrew Koyanagi, MD;  Location: [place];  Service: Ophthalmology;  Laterality: Right;  12.59, 01:23.4, 15.1%   CHOLECYSTECTOMY     EUS N/A 10/31/2017   Procedure: LOWER ENDOSCOPIC ULTRASOUND (EUS);  Surgeon: Carol Ada, MD;  Location: [facility];  Service: Endoscopy;  Laterality: N/A;    Family History  Problem Relation Age of Onset   Cancer Mother        Kidney   Diabetes Mother    Cancer Paternal Uncle        colon   Cancer Paternal Aunt 43       breast cancer   Hyperlipidemia Neg Hx    Hypertension Neg Hx    Stroke Neg Hx     Social History   Tobacco Use   Smoking status: Every Day    Packs/day: 1.00    Years: 56.00    Pack years: 56.00    Types: Cigarettes   Smokeless tobacco: Never   Tobacco comments:    started smoking age 1  Vaping Use   Vaping Use: Never used  Substance Use Topics   Alcohol use: No    Alcohol/week: 0.0 standard drinks   Drug use: No    Medications: I have reviewed the patient's current medications. Prior to Admission: (Not in a hospital admission)  Scheduled: Continuous:  sodium chloride 1,000 mL (08/25/21 0801)   heparin 1,000 Units/hr (08/25/21 0921)  piperacillin-tazobactam (ZOSYN)  IV     PJA:[id number] **OR** acetaminophen, ALPRAZolam, HYDROmorphone (DILAUDID) injection, ondansetron **OR** ondansetron (ZOFRAN) IV  No Known Allergies   ROS:  A comprehensive review of systems was negative except for: Gastrointestinal: positive for abdominal pain, constipation, and diarrhea  Blood pressure 114/63, pulse 78, temperature 98.1 F (36.7 C), temperature source Oral, resp. rate (!) 21, height 5\' 6"  (1.676 m), weight 60 kg, SpO2 91 %. Physical Exam Vitals reviewed.  Constitutional:      Appearance: He is well-developed.  HENT:     Head: Normocephalic.  Eyes:     Extraocular Movements: Extraocular movements intact.  Cardiovascular:     Rate and Rhythm: Normal rate  and regular rhythm.  Pulmonary:     Effort: Pulmonary effort is normal.  Abdominal:     General: There is no distension.     Palpations: Abdomen is soft.     Tenderness: There is abdominal tenderness in the suprapubic area. There is no guarding or rebound.  Skin:    General: Skin is warm.  Neurological:     General: No focal deficit present.     Mental Status: He is alert and oriented to person, place, and time.  Psychiatric:        Mood and Affect: Mood normal.        Behavior: Behavior normal.    Results: Results for orders placed or performed during the hospital encounter of 08/24/21 (from the past 48 hour(s))  CBC     Status: Abnormal   Collection Time: 08/25/21  1:00 AM  Result Value Ref Range   WBC 15.8 (H) 4.0 - 10.5 K/uL   RBC 4.84 4.22 - 5.81 MIL/uL   Hemoglobin 15.1 13.0 - 17.0 g/dL   HCT 44.3 39.0 - 52.0 %   MCV 91.5 80.0 - 100.0 fL   MCH 31.2 26.0 - 34.0 pg   MCHC 34.1 30.0 - 36.0 g/dL   RDW 13.7 11.5 - 15.5 %   Platelets 198 150 - 400 K/uL   nRBC 0.0 0.0 - 0.2 %    Comment: Performed at [facility], [address] [place], [place] [address]  Comprehensive metabolic panel     Status: Abnormal   Collection Time: 08/25/21  1:00 AM  Result Value Ref Range   Sodium 135 135 - 145 mmol/L   Potassium 3.6 3.5 - 5.1 mmol/L   Chloride 101 98 - 111 mmol/L   CO2 26 22 - 32 mmol/L   Glucose, Bld 109 (H) 70 - 99 mg/dL    Comment: Glucose reference range applies only to samples taken after fasting for at least 8 hours.   BUN 11 8 - 23 mg/dL   Creatinine, Ser 0.77 0.61 - 1.24 mg/dL   Calcium 9.0 8.9 - 10.3 mg/dL   Total Protein 7.2 6.5 - 8.1 g/dL   Albumin 3.8 3.5 - 5.0 g/dL   AST 19 15 - 41 U/L   ALT 13 0 - 44 U/L   Alkaline Phosphatase 63 38 - 126 U/L   Total Bilirubin 1.1 0.3 - 1.2 mg/dL   GFR, Estimated >60 >60 mL/min    Comment: (NOTE) Calculated using the CKD-EPI Creatinine Equation (2021)    Anion gap 8 5 - 15    Comment: Performed at [facility],  [address], [place], [place] [address]  Lipase, blood     Status: None   Collection Time: 08/25/21  1:00 AM  Result Value Ref Range   Lipase 21  11 - 51 U/L    Comment: Performed at [facility], [address] [place], [place] [address]  Urinalysis, Routine w reflex microscopic Urine, Clean Catch     Status: Abnormal   Collection Time: 08/25/21  5:00 AM  Result Value Ref Range   Color, Urine YELLOW YELLOW   APPearance CLEAR CLEAR   Specific Gravity, Urine <1.005 (L) 1.005 - 1.030   pH 5.0 5.0 - 8.0   Glucose, UA NEGATIVE NEGATIVE mg/dL   Hgb urine dipstick SMALL (A) NEGATIVE   Bilirubin Urine NEGATIVE NEGATIVE   Ketones, ur NEGATIVE NEGATIVE mg/dL   Protein, ur NEGATIVE NEGATIVE mg/dL   Nitrite NEGATIVE NEGATIVE   Leukocytes,Ua NEGATIVE NEGATIVE    Comment: Performed at [facility], [address], [place], [place] [address]  Urinalysis, Microscopic (reflex)     Status: Abnormal   Collection Time: 08/25/21  5:00 AM  Result Value Ref Range   RBC / HPF 6-10 0 - 5 RBC/hpf   WBC, UA 0-5 0 - 5 WBC/hpf   Bacteria, UA RARE (A) NONE SEEN   Squamous Epithelial / LPF 0-5 0 - 5    Comment: Performed at [facility], [address] [place], [place] [address]  Resp Panel by RT-PCR (Flu A&B, Covid) Nasopharyngeal Swab     Status: None   Collection Time: 08/25/21  6:06 AM   Specimen: Nasopharyngeal Swab; Nasopharyngeal(NP) swabs in vial transport medium  Result Value Ref Range   SARS Coronavirus 2 by RT PCR NEGATIVE NEGATIVE    Comment: (NOTE) SARS-CoV-2 target nucleic acids are NOT DETECTED.  The SARS-CoV-2 RNA is generally detectable in upper respiratory specimens during the acute phase of infection. The lowest concentration of SARS-CoV-2 viral copies this assay can detect is 138 copies/mL. A negative result does not preclude SARS-Cov-2 infection and should not be used as the sole basis for treatment or other patient management decisions. A negative result may occur with  improper specimen  collection/handling, submission of specimen other than nasopharyngeal swab, presence of viral mutation(s) within the areas targeted by this assay, and inadequate number of viral copies(<138 copies/mL). A negative result must be combined with clinical observations, patient history, and epidemiological information. The expected result is Negative.  Fact Sheet for Patients:  EntrepreneurPulse.com.au  Fact Sheet for Healthcare Providers:  IncredibleEmployment.be  This test is no t yet approved or cleared by the [place] FDA and  has been authorized for detection and/or diagnosis of SARS-CoV-2 by FDA under an Emergency Use Authorization (EUA). This EUA will remain  in effect (meaning this test can be used) for the duration of the COVID-19 declaration under Section 564(b)(1) of the Act, 21 U.S.C.section 360bbb-3(b)(1), unless the authorization is terminated  or revoked sooner.       Influenza A by PCR NEGATIVE NEGATIVE   Influenza B by PCR NEGATIVE NEGATIVE    Comment: (NOTE) The Xpert Xpress SARS-CoV-2/FLU/RSV plus assay is intended as an aid in the diagnosis of influenza from Nasopharyngeal swab specimens and should not be used as a sole basis for treatment. Nasal washings and aspirates are unacceptable for Xpert Xpress SARS-CoV-2/FLU/RSV testing.  Fact Sheet for Patients: EntrepreneurPulse.com.au  Fact Sheet for Healthcare Providers: IncredibleEmployment.be  This test is not yet approved or cleared by the [place] FDA and has been authorized for detection and/or diagnosis of SARS-CoV-2 by FDA under an Emergency Use Authorization (EUA). This EUA will remain in effect (meaning this test can be used) for the duration of the COVID-19 declaration  under Section 564(b)(1) of the Act, 21 U.S.C. section 360bbb-3(b)(1), unless the authorization is terminated or revoked.  Performed at [facility],  [address], [place], [place] [address]   Lactic acid, plasma     Status: None   Collection Time: 08/25/21  7:52 AM  Result Value Ref Range   Lactic Acid, Venous 1.1 0.5 - 1.9 mmol/L    Comment: Performed at [facility], [address], [place], [place] [address]   Personally reviewed- signs of thickened sigmoid colon and colitis, no free air, compared to CT 07/2021 bowel wall looks thicker, has prior iliac occlusion and high grade stenosis of the IMA CT ABDOMEN PELVIS W CONTRAST  Result Date: 08/25/2021 CLINICAL DATA:  Lower abdominal pain, diarrhea. Stage II colon cancer. EXAM: CT ABDOMEN AND PELVIS WITH CONTRAST TECHNIQUE: Multidetector CT imaging of the abdomen and pelvis was performed using the standard protocol following bolus administration of intravenous contrast. CONTRAST:  191mL OMNIPAQUE IOHEXOL 300 MG/ML  SOLN COMPARISON:  07/12/2021 FINDINGS: Lower chest: Bibasilar atelectasis. Mild coronary artery calcification. Global cardiac size within normal limits. Hepatobiliary: Status post cholecystectomy. Mild intra and extrahepatic biliary ductal dilation may simply represent post cholecystectomy change. Liver otherwise unremarkable. Pancreas: Unremarkable Spleen: Unremarkable Adrenals/Urinary Tract: The kidneys are normal in size and position. Simple cortical cyst arises exophytically from the lateral interpolar region of the right kidney. Stable 3 mm nonobstructing calculus within the interpolar region of the left kidney. The kidneys are otherwise unremarkable. The bladder is unremarkable. Stomach/Bowel: There is a roughly 5 cm length segment of ischemic bowel involving the terminal sigmoid colon with mucosal non enhancement, best seen on image # 68/2. There is no associated pneumatosis or extraluminal gas to suggest perforation. The remainder of the small and large bowel demonstrates normal enhancement. Given its focality and somewhat unusual location, differential should include the sequela of  arterial embolization or radiation colitis in the appropriate clinical setting. The stomach, small bowel, and large bowel are otherwise unremarkable. No evidence of obstruction. Appendix normal. No free intraperitoneal gas or fluid. Vascular/Lymphatic: There is extensive mixed atherosclerotic plaque throughout the infrarenal abdominal aorta and iliofemoral arterial vasculature. There is a high-grade focal stenosis of the inferior mesenteric artery at its origin, best seen on sagittal image # 47/5. There is segmental occlusion of the right common and proximal external iliac arteries with reconstitution of the right common femoral artery via the epigastric arcade. Left-sided IVC present. No pathologic adenopathy within the abdomen and pelvis. Reproductive: Prostate is unremarkable. Other: No abdominal wall hernia. Musculoskeletal: No acute bone abnormality. No lytic or blastic bone lesion. IMPRESSION: Short-segment ischemic colitis involving the terminal sigmoid colon. Given the relative focality and unusual location, additional considerations should include arterial embolism and radiation colitis in the appropriate clinical setting (i.e. Was this segment of bowel within the treatment zone). Mild coronary artery calcification. Minimal nonobstructing left nephrolithiasis. Status post cholecystectomy with probable post cholecystectomy change involving the biliary tree. Variant anatomy with left-sided IVC. Peripheral vascular disease with high-grade stenosis of the inferior mesenteric artery and segmental occlusion of the right lower extremity arterial inflow with reconstitution via the epigastric arcade. Aortic Atherosclerosis (ICD10-I70.0). Electronically Signed   By: Fidela Salisbury M.D.   On: 08/25/2021 02:51     Assessment & Plan:  JOSIEL GAHM is a 75 y.o. male with stenosis of the IMA, signs of what is likely ischemic colitis given that he had rectal cancer and radiation and this is higher and CT from  07/2021 there  is more normal wall thickness. He really does not want to do any surgery if needed initially. Discussed with him and his sister that if he worsened and needed surgery that if he opted not proceed with colectomy and colostomy, it would ultimately lead to death. Discussed that if he did not want any surgery given the potential need in the upcoming days if things worsen, then he will need to be DNR and plans for comfort care.   He is going to talk to his sister and make a decision if he would accept surgery if needed. Discussed potential need for colonoscopy.  Have sent IR a message regarding any stent or angioplasty that could be possible for the stenosis, no obvious embolus, he has been having issues with diarrhea and I wonder if this could be subacute process  Heparin gtt started Antibiotics NPO   All questions were answered to the satisfaction of the patient and family. Discussed with team.   Labs reassuring with normal lactic acid.     Virl Cagey 08/25/2021, 9:33 AM

## 2021-08-26 DIAGNOSIS — Z515 Encounter for palliative care: Secondary | ICD-10-CM | POA: Diagnosis not present

## 2021-08-26 DIAGNOSIS — Z7189 Other specified counseling: Secondary | ICD-10-CM | POA: Diagnosis not present

## 2021-08-26 DIAGNOSIS — K559 Vascular disorder of intestine, unspecified: Secondary | ICD-10-CM | POA: Diagnosis not present

## 2021-08-26 DIAGNOSIS — K55039 Acute (reversible) ischemia of large intestine, extent unspecified: Secondary | ICD-10-CM | POA: Diagnosis not present

## 2021-08-26 DIAGNOSIS — F0631 Mood disorder due to known physiological condition with depressive features: Secondary | ICD-10-CM | POA: Diagnosis not present

## 2021-08-26 LAB — COMPREHENSIVE METABOLIC PANEL
ALT: 12 U/L (ref 0–44)
AST: 18 U/L (ref 15–41)
Albumin: 3 g/dL — ABNORMAL LOW (ref 3.5–5.0)
Alkaline Phosphatase: 55 U/L (ref 38–126)
Anion gap: 7 (ref 5–15)
BUN: 12 mg/dL (ref 8–23)
CO2: 22 mmol/L (ref 22–32)
Calcium: 7.7 mg/dL — ABNORMAL LOW (ref 8.9–10.3)
Chloride: 102 mmol/L (ref 98–111)
Creatinine, Ser: 0.79 mg/dL (ref 0.61–1.24)
GFR, Estimated: >60 mL/min (ref >60)
Glucose, Bld: 82 mg/dL (ref 70–99)
Potassium: 3.5 mmol/L (ref 3.5–5.1)
Sodium: 131 mmol/L — ABNORMAL LOW (ref 135–145)
Total Bilirubin: 1.1 mg/dL (ref 0.3–1.2)
Total Protein: 6.1 g/dL — ABNORMAL LOW (ref 6.5–8.1)

## 2021-08-26 LAB — CBC
HCT: 38.3 % — ABNORMAL LOW (ref 39.0–52.0)
Hemoglobin: 12.9 g/dL — ABNORMAL LOW (ref 13.0–17.0)
MCH: 31 pg (ref 26.0–34.0)
MCHC: 33.7 g/dL (ref 30.0–36.0)
MCV: 92.1 fL (ref 80.0–100.0)
Platelets: 177 10*3/uL (ref 150–400)
RBC: 4.16 MIL/uL — ABNORMAL LOW (ref 4.22–5.81)
RDW: 13.7 % (ref 11.5–15.5)
WBC: 12.4 10*3/uL — ABNORMAL HIGH (ref 4.0–10.5)
nRBC: 0 % (ref 0.0–0.2)

## 2021-08-26 LAB — HEPARIN LEVEL (UNFRACTIONATED): Heparin Unfractionated: 0.55 IU/mL (ref 0.30–0.70)

## 2021-08-26 LAB — MAGNESIUM: Magnesium: 1.8 mg/dL (ref 1.7–2.4)

## 2021-08-26 MED ORDER — SERTRALINE HCL 50 MG PO TABS
25.0000 mg | ORAL_TABLET | Freq: Every day | ORAL | Status: DC
  Administered 2021-08-26 – 2021-09-05 (×10): 25 mg via ORAL
  Filled 2021-08-26 (×10): qty 1

## 2021-08-26 MED ORDER — CHLORHEXIDINE GLUCONATE CLOTH 2 % EX PADS
6.0000 | MEDICATED_PAD | Freq: Every day | CUTANEOUS | Status: DC
  Administered 2021-08-26 – 2021-09-04 (×10): 6 via TOPICAL

## 2021-08-26 MED ORDER — SERTRALINE HCL 20 MG/ML PO CONC: 25.0000 mg | Freq: Every day | ORAL | Status: DC

## 2021-08-26 NOTE — Progress Notes (Signed)
PROGRESS NOTE    Wesley [name]  CNO:[id number] DOB: Jun 13, 1946 DOA: 08/24/2021 PCP: Jearld Fenton, NP   Brief Narrative:   Wesley [name] is a 75 y.o. male with medical history significant for rectal adenocarcinoma with prior chemoradiation, ongoing tobacco abuse, and anxiety disorder who presented to the ED with complaints of sudden onset lower abdominal pain that began approximately 2 days ago after he had a bowel movement.  He was noted to have stenosis of the IMA with likely ischemic colitis.  He has been started on heparin drip as well as Zosyn and is currently NPO.  Further plans per general surgery pending.  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 status until further general surgery evaluation -Continue to monitor leukocytosis with repeat labs -Lactic acid within normal limits -Continue IV fluid -IV Zosyn   Tobacco abuse -Currently 1 pack/day -Nicotine patch offered -Counseled on cessation   Anxiety disorder/insomnia -Xanax at bedtime  Depression -TTS evaluation pending   History of rectal adenocarcinoma -Currently under surveillance   DVT prophylaxis: Heparin drip Code Status: DNR Family Communication: Sister at bedside 9/25 Disposition Plan:  Status is: Inpatient  Remains inpatient appropriate because:IV treatments appropriate due to intensity of illness or inability to take PO and Inpatient level of care appropriate due to severity of illness  Dispo: The patient is from: Home              Anticipated d/c is to: Home              Patient currently is not medically stable to d/c.   Difficult to place patient No   Consultants:  General surgery TTS IR  Procedures:  See below  Antimicrobials:  Anti-infectives (From admission, onward)     Start     Dose/Rate Route Frequency Ordered Stop   08/25/21 1200  piperacillin-tazobactam (ZOSYN) IVPB 3.375 g        3.375 g 12.5 mL/hr over 240 Minutes Intravenous Every 8 hours 08/25/21 0813     08/25/21 0430  piperacillin-tazobactam (ZOSYN) IVPB 3.375 g        3.375 g 100 mL/hr over 30 Minutes Intravenous  Once 08/25/21 0427 08/25/21 0604       Subjective: Patient seen and evaluated today with ongoing significant abdominal pain noted this morning.  Objective: Vitals:   08/25/21 1100 08/25/21 2200 08/26/21 0213 08/26/21 0607  BP: (!) 126/59 110/60 (!) 106/48 (!) 114/53  Pulse: 74 75 73 71  Resp: 18 18 18 18   Temp: (!) 97.3 F (36.3 C) 98.1 F (36.7 C) (!) 97.3 F (36.3 C) 98.4 F (36.9 C)  TempSrc: Oral Oral Oral Oral  SpO2: 97% 93% 94% 93%  Weight:      Height:        Intake/Output Summary (Last 24 hours) at 08/26/2021 1132 Last data filed at 08/26/2021 0500 Gross per 24 hour  Intake 949.64 ml  Output 500 ml  Net 449.64 ml   Filed Weights   08/24/21 2043  Weight: 60 kg    Examination:  General exam: Appears uncomfortable Respiratory system: Clear to auscultation. Respiratory effort normal. Cardiovascular system: S1 & S2 heard, RRR.  Gastrointestinal system: Abdomen is tender to palpation Central nervous system: Alert and awake Extremities: No edema Skin: No significant lesions noted Psychiatry: Flat  affect, with depression    Data Reviewed: I have personally reviewed following labs and imaging studies  CBC: Recent Labs  Lab 08/25/21 0100 08/26/21 0118  WBC 15.8* 12.4*  HGB 15.1 12.9*  HCT 44.3 38.3*  MCV 91.5 92.1  PLT 198 195   Basic Metabolic Panel: Recent Labs  Lab 08/25/21 0100 08/26/21 0118  NA 135 131*  K 3.6 3.5  CL 101 102  CO2 26 22  GLUCOSE 109* 82  BUN 11 12  CREATININE 0.77 0.79  CALCIUM 9.0 7.7*  MG  --  1.8   GFR: Estimated Creatinine Clearance: 67.7 mL/min (by C-G formula based on SCr of 0.79 mg/dL). Liver  Function Tests: Recent Labs  Lab 08/25/21 0100 08/26/21 0118  AST 19 18  ALT 13 12  ALKPHOS 63 55  BILITOT 1.1 1.1  PROT 7.2 6.1*  ALBUMIN 3.8 3.0*   Recent Labs  Lab 08/25/21 0100  LIPASE 21   No results for input(s): AMMONIA in the last 168 hours. Coagulation Profile: No results for input(s): INR, PROTIME in the last 168 hours. Cardiac Enzymes: No results for input(s): CKTOTAL, CKMB, CKMBINDEX, TROPONINI in the last 168 hours. BNP (last 3 results) No results for input(s): PROBNP in the last 8760 hours. HbA1C: No results for input(s): HGBA1C in the last 72 hours. CBG: No results for input(s): GLUCAP in the last 168 hours. Lipid Profile: No results for input(s): CHOL, HDL, LDLCALC, TRIG, CHOLHDL, LDLDIRECT in the last 72 hours. Thyroid Function Tests: No results for input(s): TSH, T4TOTAL, FREET4, T3FREE, THYROIDAB in the last 72 hours. Anemia Panel: No results for input(s): VITAMINB12, FOLATE, FERRITIN, TIBC, IRON, RETICCTPCT in the last 72 hours. Sepsis Labs: Recent Labs  Lab 08/25/21 0752 08/25/21 0958  LATICACIDVEN 1.1 1.0    Recent Results (from the past 240 hour(s))  Resp Panel by RT-PCR (Flu A&B, Covid) Nasopharyngeal Swab     Status: None   Collection Time: 08/25/21  6:06 AM   Specimen: Nasopharyngeal Swab; Nasopharyngeal(NP) swabs in vial transport medium  Result Value Ref Range Status   SARS Coronavirus 2 by RT PCR NEGATIVE NEGATIVE Final    Comment: (NOTE) SARS-CoV-2 target nucleic acids are NOT DETECTED.  The SARS-CoV-2 RNA is generally detectable in upper respiratory specimens during the acute phase of infection. The lowest concentration of SARS-CoV-2 viral copies this assay can detect is 138 copies/mL. A negative result does not preclude SARS-Cov-2 infection and should not be used as the sole basis for treatment or other patient management decisions. A negative result may occur with  improper specimen collection/handling, submission of specimen  other than nasopharyngeal swab, presence of viral mutation(s) within the areas targeted by this assay, and inadequate number of viral copies(<138 copies/mL). A negative result must be combined with clinical observations, patient history, and epidemiological information. The expected result is Negative.  Fact Sheet for Patients:  EntrepreneurPulse.com.au  Fact Sheet for Healthcare Providers:  IncredibleEmployment.be  This test is no t yet approved or cleared by the [place] FDA and  has been authorized for detection and/or diagnosis of SARS-CoV-2 by FDA under an Emergency Use Authorization (EUA). This EUA will remain  in effect (meaning this test can be used) for the duration of the COVID-19 declaration under Section 564(b)(1) of the Act, 21 U.S.C.section 360bbb-3(b)(1), unless the authorization is terminated  or revoked sooner.       Influenza A by PCR NEGATIVE NEGATIVE Final   Influenza B by PCR NEGATIVE NEGATIVE Final    Comment: (  NOTE) The Xpert Xpress SARS-CoV-2/FLU/RSV plus assay is intended as an aid in the diagnosis of influenza from Nasopharyngeal swab specimens and should not be used as a sole basis for treatment. Nasal washings and aspirates are unacceptable for Xpert Xpress SARS-CoV-2/FLU/RSV testing.  Fact Sheet for Patients: EntrepreneurPulse.com.au  Fact Sheet for Healthcare Providers: IncredibleEmployment.be  This test is not yet approved or cleared by the [place] FDA and has been authorized for detection and/or diagnosis of SARS-CoV-2 by FDA under an Emergency Use Authorization (EUA). This EUA will remain in effect (meaning this test can be used) for the duration of the COVID-19 declaration under Section 564(b)(1) of the Act, 21 U.S.C. section 360bbb-3(b)(1), unless the authorization is terminated or revoked.  Performed at [facility], [address] [place], [place]  [address]          Radiology Studies: CT ABDOMEN PELVIS W CONTRAST  Result Date: 08/25/2021 CLINICAL DATA:  Lower abdominal pain, diarrhea. Stage II colon cancer. EXAM: CT ABDOMEN AND PELVIS WITH CONTRAST TECHNIQUE: Multidetector CT imaging of the abdomen and pelvis was performed using the standard protocol following bolus administration of intravenous contrast. CONTRAST:  160mL OMNIPAQUE IOHEXOL 300 MG/ML  SOLN COMPARISON:  07/12/2021 FINDINGS: Lower chest: Bibasilar atelectasis. Mild coronary artery calcification. Global cardiac size within normal limits. Hepatobiliary: Status post cholecystectomy. Mild intra and extrahepatic biliary ductal dilation may simply represent post cholecystectomy change. Liver otherwise unremarkable. Pancreas: Unremarkable Spleen: Unremarkable Adrenals/Urinary Tract: The kidneys are normal in size and position. Simple cortical cyst arises exophytically from the lateral interpolar region of the right kidney. Stable 3 mm nonobstructing calculus within the interpolar region of the left kidney. The kidneys are otherwise unremarkable. The bladder is unremarkable. Stomach/Bowel: There is a roughly 5 cm length segment of ischemic bowel involving the terminal sigmoid colon with mucosal non enhancement, best seen on image # 68/2. There is no associated pneumatosis or extraluminal gas to suggest perforation. The remainder of the small and large bowel demonstrates normal enhancement. Given its focality and somewhat unusual location, differential should include the sequela of arterial embolization or radiation colitis in the appropriate clinical setting. The stomach, small bowel, and large bowel are otherwise unremarkable. No evidence of obstruction. Appendix normal. No free intraperitoneal gas or fluid. Vascular/Lymphatic: There is extensive mixed atherosclerotic plaque throughout the infrarenal abdominal aorta and iliofemoral arterial vasculature. There is a high-grade focal stenosis of  the inferior mesenteric artery at its origin, best seen on sagittal image # 47/5. There is segmental occlusion of the right common and proximal external iliac arteries with reconstitution of the right common femoral artery via the epigastric arcade. Left-sided IVC present. No pathologic adenopathy within the abdomen and pelvis. Reproductive: Prostate is unremarkable. Other: No abdominal wall hernia. Musculoskeletal: No acute bone abnormality. No lytic or blastic bone lesion. IMPRESSION: Short-segment ischemic colitis involving the terminal sigmoid colon. Given the relative focality and unusual location, additional considerations should include arterial embolism and radiation colitis in the appropriate clinical setting (i.e. Was this segment of bowel within the treatment zone). Mild coronary artery calcification. Minimal nonobstructing left nephrolithiasis. Status post cholecystectomy with probable post cholecystectomy change involving the biliary tree. Variant anatomy with left-sided IVC. Peripheral vascular disease with high-grade stenosis of the inferior mesenteric artery and segmental occlusion of the right lower extremity arterial inflow with reconstitution via the epigastric arcade. Aortic Atherosclerosis (ICD10-I70.0). Electronically Signed   By: Fidela Salisbury M.D.   On: 08/25/2021 02:51        Scheduled Meds:  Chlorhexidine Gluconate Cloth  6 each Topical Daily   Continuous Infusions:  sodium chloride 1,000 mL (08/25/21 0801)   heparin 1,100 Units/hr (08/25/21 1809)   piperacillin-tazobactam (ZOSYN)  IV 3.375 g (08/26/21 0443)     LOS: 1 day    Time spent: 35 minutes    Bryant Saye D Manuella Ghazi, DO [facility]  If 7PM-7AM, please contact night-coverage [email or web address] 08/26/2021, 11:32 AM

## 2021-08-26 NOTE — Consult Note (Signed)
Telepsych Consultation   Reason for Consult:  severe depression..suicidal ideation evaluation Referring Physician:  Heath Lark, MD Location of Patient:  [facility]337[facility] Location of Provider: [place] Department  Patient Identification: Wesley [name] MRN:  [id number] Principal Diagnosis: <principal problem not specified> Diagnosis:  Active Problems:   Acute ischemic colitis ([place])  1058: Patient hard of hearing, partially sedated; unable to participate in assessment.   Total Time spent with patient: 20 minutes  Subjective:   Wesley [name] is a 75 y.o. male patient admitted with ischemic colitis.  1429: Patient presents alert, hard of hearing, daughter at the bedside to assist in translating questions to patient due to virtual setting of assessment. Daughter states dad has remote history of depression; not consistent with medications (Xanax, Zoloft) in the past to see any results. States patient does have previously hospitalized for possibly depression related illnes >20 years ago in [place]. States current problem as, "He just has a very negative outlook on life. Never positive" per baseline. Endorses poor sleep, anhedonia, feelings of hopelessness, amotivation, and "dwells on things in life". Denies any history or active thoughts of suicidal or homicidal, auditory or visual hallucinations. "Has some confusion due to hard hearing. It's just his outlook on life, I grew up with my dad being a negative, never positive person". Denies any safety concerns. States he is willing to restart anti-depressant medication. Discussed anti-depressant medication Zoloft; both patient and daughter expressed an understanding. Medication ordered; patient to be psychiatrically cleared.  Per RN note 08/26/21 0659 "At 0500, pt was anxious, concerned about his health, family situation, and future. Patient complained of nausea and pain like a "knife in the stomach". RN gave PRN Dilaudid, Zofran,  Xanax. Patient resting comfortably. Patient drank 236 mL Ginger Ale overnight."  Per RN note 08/25/21 7902 "Patient has made mention of wanting to "be done with life" and has said "I wish you would give me enough pain medicine so I could go to sleep and never wake up" in front of this nurse. Patient's sister and daughter spoke with this nurse and also expressed concern for patient. They state that he has been negative since he was a child, but has gotten much worse since the unexpected death of his wife. Patient is not on any antidepressant. Concerned for patient's mental health, this RN reached out to Dr. 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, radiation and chemo 2019   Wears dentures    full upper and lower   Wears hearing aid in both ears     Past Surgical History:  Procedure Laterality Date   CATARACT EXTRACTION W/PHACO Left 05/17/2020   Procedure: CATARACT EXTRACTION PHACO AND INTRAOCULAR LENS PLACEMENT (IOC) LEFT 8.43  00:57.1  14.7%;  Surgeon: Leandrew Koyanagi, MD;  Location: [facility];  Service: Ophthalmology;  Laterality: Left;   CATARACT EXTRACTION W/PHACO Right 07/19/2020   Procedure: CATARACT EXTRACTION PHACO AND INTRAOCULAR LENS PLACEMENT ([place]) RIGHT MALYUGIN;  Surgeon: Leandrew Koyanagi, MD;  Location: [facility];  Service: Ophthalmology;  Laterality: Right;  12.59, 01:23.4, 15.1%  CHOLECYSTECTOMY     EUS N/A 10/31/2017   Procedure: LOWER ENDOSCOPIC ULTRASOUND (EUS);  Surgeon: Carol Ada, MD;  Location: [facility];  Service: Endoscopy;  Laterality: N/A;   Family History:   Family History  Problem Relation Age of Onset   Cancer Mother        Kidney   Diabetes Mother    Cancer Paternal Uncle        colon   Cancer Paternal Aunt 36       breast cancer   Hyperlipidemia Neg Hx    Hypertension Neg Hx    Stroke Neg Hx    Family Psychiatric  History: not noted Social History:  Social History   Substance and Sexual Activity  Alcohol Use No   Alcohol/week: 0.0 standard drinks     Social History   Substance and Sexual Activity  Drug Use No    Social History   Socioeconomic History   Marital status: Widowed    Spouse name: Not on file   Number of children: 3   Years of education: Not on file   Highest education level: Not on file  Occupational History   Occupation: truck driver  Tobacco Use   Smoking status: Every Day    Packs/day: 1.00    Years: 56.00    Pack years: 56.00    Types: Cigarettes   Smokeless tobacco: Never   Tobacco comments:    started smoking age 12  Vaping Use   Vaping Use: Never used  Substance and Sexual Activity   Alcohol use: No    Alcohol/week: 0.0 standard drinks   Drug use: No   Sexual activity: Not Currently  Other Topics Concern   Not on file  Social History Narrative   Not on file   Social Determinants of Health   Financial Resource Strain: Not on file  Food Insecurity: Not on file  Transportation Needs: Not on file  Physical Activity: Not on file  Stress: Not on file  Social Connections: Not on file   Additional Social History:    Allergies:  No Known Allergies  Labs:  Results for orders placed or performed during the hospital encounter of 08/24/21 (from the past 48 hour(s))  CBC     Status: Abnormal   Collection Time: 08/25/21  1:00 AM  Result Value Ref Range   WBC 15.8 (H) 4.0 - 10.5 K/uL   RBC 4.84 4.22 - 5.81 MIL/uL   Hemoglobin 15.1 13.0 - 17.0 g/dL   HCT 44.3 39.0 - 52.0 %   MCV 91.5 80.0 - 100.0 fL   MCH 31.2 26.0 - 34.0 pg   MCHC 34.1 30.0 - 36.0 g/dL   RDW 13.7 11.5 - 15.5 %    Platelets 198 150 - 400 K/uL   nRBC 0.0 0.0 - 0.2 %    Comment: Performed at [facility], [address] [place], [place] [address]  Comprehensive metabolic panel     Status: Abnormal   Collection Time: 08/25/21  1:00 AM  Result Value Ref Range   Sodium 135 135 - 145 mmol/L   Potassium 3.6 3.5 - 5.1 mmol/L   Chloride 101 98 - 111 mmol/L   CO2 26 22 - 32 mmol/L   Glucose, Bld 109 (H) 70 - 99 mg/dL    Comment: Glucose reference range applies only to samples taken after fasting for at least 8 hours.   BUN 11 8 - 23 mg/dL   Creatinine, Ser 0.77 0.61 - 1.24 mg/dL  Calcium 9.0 8.9 - 10.3 mg/dL   Total Protein 7.2 6.5 - 8.1 g/dL   Albumin 3.8 3.5 - 5.0 g/dL   AST 19 15 - 41 U/L   ALT 13 0 - 44 U/L   Alkaline Phosphatase 63 38 - 126 U/L   Total Bilirubin 1.1 0.3 - 1.2 mg/dL   GFR, Estimated >60 >60 mL/min    Comment: (NOTE) Calculated using the CKD-EPI Creatinine Equation (2021)    Anion gap 8 5 - 15    Comment: Performed at [facility], [address] [place], [place] [address]  Lipase, blood     Status: None   Collection Time: 08/25/21  1:00 AM  Result Value Ref Range   Lipase 21 11 - 51 U/L    Comment: Performed at [facility], [address], [place], [place] [address]  Urinalysis, Routine w reflex microscopic Urine, Clean Catch     Status: Abnormal   Collection Time: 08/25/21  5:00 AM  Result Value Ref Range   Color, Urine YELLOW YELLOW   APPearance CLEAR CLEAR   Specific Gravity, Urine <1.005 (L) 1.005 - 1.030   pH 5.0 5.0 - 8.0   Glucose, UA NEGATIVE NEGATIVE mg/dL   Hgb urine dipstick SMALL (A) NEGATIVE   Bilirubin Urine NEGATIVE NEGATIVE   Ketones, ur NEGATIVE NEGATIVE mg/dL   Protein, ur NEGATIVE NEGATIVE mg/dL   Nitrite NEGATIVE NEGATIVE   Leukocytes,Ua NEGATIVE NEGATIVE    Comment: Performed at [facility], [address] [place], [place] [address]  Urinalysis, Microscopic (reflex)     Status: Abnormal   Collection Time: 08/25/21  5:00 AM  Result Value Ref  Range   RBC / HPF 6-10 0 - 5 RBC/hpf   WBC, UA 0-5 0 - 5 WBC/hpf   Bacteria, UA RARE (A) NONE SEEN   Squamous Epithelial / LPF 0-5 0 - 5    Comment: Performed at [facility], [address] [place], [place] [address]  Resp Panel by RT-PCR (Flu A&B, Covid) Nasopharyngeal Swab     Status: None   Collection Time: 08/25/21  6:06 AM   Specimen: Nasopharyngeal Swab; Nasopharyngeal(NP) swabs in vial transport medium  Result Value Ref Range   SARS Coronavirus 2 by RT PCR NEGATIVE NEGATIVE    Comment: (NOTE) SARS-CoV-2 target nucleic acids are NOT DETECTED.  The SARS-CoV-2 RNA is generally detectable in upper respiratory specimens during the acute phase of infection. The lowest concentration of SARS-CoV-2 viral copies this assay can detect is 138 copies/mL. A negative result does not preclude SARS-Cov-2 infection and should not be used as the sole basis for treatment or other patient management decisions. A negative result may occur with  improper specimen collection/handling, submission of specimen other than nasopharyngeal swab, presence of viral mutation(s) within the areas targeted by this assay, and inadequate number of viral copies(<138 copies/mL). A negative result must be combined with clinical observations, patient history, and epidemiological information. The expected result is Negative.  Fact Sheet for Patients:  EntrepreneurPulse.com.au  Fact Sheet for Healthcare Providers:  IncredibleEmployment.be  This test is no t yet approved or cleared by the [place] FDA and  has been authorized for detection and/or diagnosis of SARS-CoV-2 by FDA under an Emergency Use Authorization (EUA). This EUA will remain  in effect (meaning this test can be used) for the duration of the COVID-19 declaration under Section 564(b)(1) of the Act, 21 U.S.C.section 360bbb-3(b)(1), unless the authorization is terminated  or revoked sooner.  Influenza A  by PCR NEGATIVE NEGATIVE   Influenza B by PCR NEGATIVE NEGATIVE    Comment: (NOTE) The Xpert Xpress SARS-CoV-2/FLU/RSV plus assay is intended as an aid in the diagnosis of influenza from Nasopharyngeal swab specimens and should not be used as a sole basis for treatment. Nasal washings and aspirates are unacceptable for Xpert Xpress SARS-CoV-2/FLU/RSV testing.  Fact Sheet for Patients: EntrepreneurPulse.com.au  Fact Sheet for Healthcare Providers: IncredibleEmployment.be  This test is not yet approved or cleared by the [place] FDA and has been authorized for detection and/or diagnosis of SARS-CoV-2 by FDA under an Emergency Use Authorization (EUA). This EUA will remain in effect (meaning this test can be used) for the duration of the COVID-19 declaration under Section 564(b)(1) of the Act, 21 U.S.C. section 360bbb-3(b)(1), unless the authorization is terminated or revoked.  Performed at [facility], [address], [place], [place] [address]   Lactic acid, plasma     Status: None   Collection Time: 08/25/21  7:52 AM  Result Value Ref Range   Lactic Acid, Venous 1.1 0.5 - 1.9 mmol/L    Comment: Performed at [facility], [address], [place], [place] [address]  Lactic acid, plasma     Status: None   Collection Time: 08/25/21  9:58 AM  Result Value Ref Range   Lactic Acid, Venous 1.0 0.5 - 1.9 mmol/L    Comment: Performed at [facility], [address] [place], [place] [address]  Heparin level (unfractionated)     Status: Abnormal   Collection Time: 08/25/21  3:15 PM  Result Value Ref Range   Heparin Unfractionated 0.27 (L) 0.30 - 0.70 IU/mL    Comment: (NOTE) The clinical reportable range upper limit is being lowered to >1.10 to align with the FDA approved guidance for the current laboratory assay.  If heparin results are below expected values, and patient dosage has  been confirmed, suggest follow up testing of antithrombin III  levels. Performed at [facility], [address], [place], [place] [address]   Magnesium     Status: None   Collection Time: 08/26/21  1:18 AM  Result Value Ref Range   Magnesium 1.8 1.7 - 2.4 mg/dL    Comment: Performed at [facility], [address], [place], [place] [address]  Comprehensive metabolic panel     Status: Abnormal   Collection Time: 08/26/21  1:18 AM  Result Value Ref Range   Sodium 131 (L) 135 - 145 mmol/L   Potassium 3.5 3.5 - 5.1 mmol/L   Chloride 102 98 - 111 mmol/L   CO2 22 22 - 32 mmol/L   Glucose, Bld 82 70 - 99 mg/dL    Comment: Glucose reference range applies only to samples taken after fasting for at least 8 hours.   BUN 12 8 - 23 mg/dL   Creatinine, Ser 0.79 0.61 - 1.24 mg/dL   Calcium 7.7 (L) 8.9 - 10.3 mg/dL   Total Protein 6.1 (L) 6.5 - 8.1 g/dL   Albumin 3.0 (L) 3.5 - 5.0 g/dL   AST 18 15 - 41 U/L   ALT 12 0 - 44 U/L   Alkaline Phosphatase 55 38 - 126 U/L   Total Bilirubin 1.1 0.3 - 1.2 mg/dL   GFR, Estimated >60 >60 mL/min    Comment: (NOTE) Calculated using the CKD-EPI Creatinine Equation (2021)    Anion gap 7 5 - 15    Comment: Performed at [facility], [address], [place], [place] [address]  CBC     Status: Abnormal   Collection Time: 08/26/21  1:18 AM  Result Value Ref Range   WBC 12.4 (H) 4.0 - 10.5 K/uL   RBC 4.16 (L) 4.22 - 5.81 MIL/uL   Hemoglobin 12.9 (L) 13.0 - 17.0 g/dL   HCT 38.3 (L) 39.0 - 52.0 %   MCV 92.1 80.0 - 100.0 fL   MCH 31.0 26.0 - 34.0 pg   MCHC 33.7 30.0 - 36.0 g/dL   RDW 13.7 11.5 - 15.5 %   Platelets 177 150 - 400 K/uL   nRBC 0.0 0.0 - 0.2 %    Comment: Performed at [facility], [address], [place], [place] [address]  Heparin level (unfractionated)     Status: None   Collection Time: 08/26/21  1:18 AM  Result Value Ref Range   Heparin Unfractionated 0.55 0.30 - 0.70 IU/mL    Comment: (NOTE) The clinical reportable range upper limit is being lowered to >1.10 to align with the FDA approved guidance for  the current laboratory assay.  If heparin results are below expected values, and patient dosage has  been confirmed, suggest follow up testing of antithrombin III levels. Performed at [facility], [address] [place], [place] [address]     Medications:  Current Facility-Administered Medications  Medication Dose Route Frequency Provider Last Rate Last Admin   0.9 %  sodium chloride infusion   Intravenous Continuous Heath Lark D, DO 75 mL/hr at 08/26/21 1213 New Bag at 08/26/21 1213   acetaminophen (TYLENOL) tablet 650 mg  650 mg Oral Q6H PRN Heath Lark D, DO       Or   acetaminophen (TYLENOL) suppository 650 mg  650 mg Rectal Q6H PRN Manuella Ghazi, Pratik D, DO       ALPRAZolam Duanne Moron) tablet 0.5 mg  0.5 mg Oral QHS PRN Manuella Ghazi, Pratik D, DO   0.5 mg at 08/26/21 0510   Chlorhexidine Gluconate Cloth 2 % PADS 6 each  6 each Topical Daily Heath Lark D, DO   6 each at 08/26/21 1204   heparin ADULT infusion 100 units/mL (25000 units/292mL)  1,100 Units/hr Intravenous Continuous Heath Lark D, DO 11 mL/hr at 08/25/21 1809 1,100 Units/hr at 08/25/21 1809   HYDROmorphone (DILAUDID) injection 0.5-1 mg  0.5-1 mg Intravenous Q2H PRN Heath Lark D, DO   1 mg at 08/26/21 1536   ondansetron (ZOFRAN) tablet 4 mg  4 mg Oral Q6H PRN Manuella Ghazi, Pratik D, DO       Or   ondansetron (ZOFRAN) injection 4 mg  4 mg Intravenous Q6H PRN Manuella Ghazi, Pratik D, DO   4 mg at 08/26/21 2440   piperacillin-tazobactam (ZOSYN) IVPB 3.375 g  3.375 g Intravenous Q8H Shah, Pratik D, DO 12.5 mL/hr at 08/26/21 1211 3.375 g at 08/26/21 1211   sertraline (ZOLOFT) tablet 25 mg  25 mg Oral Daily Leevy-Johnson, Zahli Vetsch A, NP        Musculoskeletal: Strength & Muscle Tone: decreased Gait & Station:  not assessed Patient leans:  patient sitting up in bed  Psychiatric Specialty Exam:  Presentation  General Appearance:  Appropriate for Environment Eye Contact: Minimal Speech: Other (comment) (low volume; pt is HOH) Speech  Volume: Decreased Handedness: No data recorded  Mood and Affect  Mood: Dysphoric Affect: Flat  Thought Process  Thought Processes: Coherent Descriptions of Associations:Intact Orientation:Partial Thought Content:Logical History of Schizophrenia/Schizoaffective disorder:No data recorded Duration of Psychotic Symptoms:No data recorded Hallucinations:Hallucinations: None Ideas of Reference:None Suicidal Thoughts:Suicidal Thoughts: No Homicidal Thoughts:Homicidal  Thoughts: No  Sensorium  Memory: Immediate Fair; Recent Fair; Remote Fair Judgment: Other (comment) (situational) Insight: Shallow  Executive Functions  Concentration: Fair Attention Span: Fair Recall: [employer] of Knowledge: Fair Language: Fair  Psychomotor Activity  Psychomotor Activity: Psychomotor Activity: Normal  Assets  Assets: Catering manager; Housing; Resilience; Social Support  Sleep  Sleep: Sleep: Poor   Physical Exam: Physical Exam Vitals and nursing note reviewed.  Constitutional:      Appearance: He is ill-appearing.  HENT:     Head: Normocephalic.     Ears:     Comments: Hard of hearing    Nose: Nose normal.  Eyes:     Pupils: Pupils are equal, round, and reactive to light.  Cardiovascular:     Rate and Rhythm: Normal rate.  Pulmonary:     Effort: Pulmonary effort is normal.  Musculoskeletal:        General: Normal range of motion.  Neurological:     Mental Status: He is alert. Mental status is at baseline.  Psychiatric:        Attention and Perception: Perception normal.        Mood and Affect: Mood is depressed. Affect is flat.        Speech: Speech normal.        Behavior: Behavior is withdrawn.        Thought Content: Thought content is not paranoid or delusional. Thought content does not include homicidal or suicidal ideation. Thought content does not include homicidal or suicidal plan.        Cognition and Memory: Cognition normal.      Comments: Impaired hearing   Review of Systems  Psychiatric/Behavioral:  Positive for depression. Negative for hallucinations and suicidal ideas. The patient has insomnia.   All other systems reviewed and are negative. Blood pressure (!) 113/57, pulse 67, temperature 97.8 F (36.6 C), temperature source Oral, resp. rate 20, height 5\' 6"  (1.676 m), weight 60 kg, SpO2 94 %. Body mass index is 21.35 kg/m.  Treatment Plan Summary: Plan Order for Zoloft 25 mg daily for depressive symptoms placed.  Disposition:  Patient assessed and recommended medications ordered.  This service was provided via telemedicine using a 2-way, interactive audio and video technology.  Names of all persons participating in this telemedicine service and their role in this encounter. Name: Oneida Alar Role: PMHNP  Name: Hampton Abbot Role: Attending MD  Name: Wesley [name] Role: patient  Name:  Role: daughter    Inda Merlin, NP 08/26/2021 3:42 PM

## 2021-08-26 NOTE — Progress Notes (Signed)
[facility] Progress Note     Subjective: Discussed case with Dr. Pascal Lux IR and discussed IMA being partially occluded likely chronic. Discussed that if something needed to be tried they could but would need definitive evidence of ischemia. Radiation colitis could be possible versus under distention of the colon.   Still with some pain and soreness. Daughter at bedside.   Objective: Vital signs in last 24 hours: Temp:  [97.3 F (36.3 C)-98.4 F (36.9 C)] 97.8 F (36.6 C) (09/25 1247) Pulse Rate:  [67-75] 67 (09/25 1247) Resp:  [18-20] 20 (09/25 1247) BP: (106-114)/(48-60) 113/57 (09/25 1247) SpO2:  [93 %-94 %] 94 % (09/25 1247)    Intake/Output from previous day: 09/24 0701 - 09/25 0700 In: 1949.6 [P.O.:236; I.V.:663.6; IV Piggyback:1050] Out: 500 [Urine:500] Intake/Output this shift: No intake/output data recorded.  General appearance: alert and no distress GI: soft, tender LLQ, no rebound or guarding  Lab Results:  Recent Labs    08/25/21 0100 08/26/21 0118  WBC 15.8* 12.4*  HGB 15.1 12.9*  HCT 44.3 38.3*  PLT 198 177   BMET Recent Labs    08/25/21 0100 08/26/21 0118  NA 135 131*  K 3.6 3.5  CL 101 102  CO2 26 22  GLUCOSE 109* 82  BUN 11 12  CREATININE 0.77 0.79  CALCIUM 9.0 7.7*   PT/INR No results for input(s): LABPROT, INR in the last 72 hours.  Studies/Results: CT ABDOMEN PELVIS W CONTRAST  Result Date: 08/25/2021 CLINICAL DATA:  Lower abdominal pain, diarrhea. Stage II colon cancer. EXAM: CT ABDOMEN AND PELVIS WITH CONTRAST TECHNIQUE: Multidetector CT imaging of the abdomen and pelvis was performed using the standard protocol following bolus administration of intravenous contrast. CONTRAST:  139mL OMNIPAQUE IOHEXOL 300 MG/ML  SOLN COMPARISON:  07/12/2021 FINDINGS: Lower chest: Bibasilar atelectasis. Mild coronary artery calcification. Global cardiac size within normal limits. Hepatobiliary: Status post cholecystectomy. Mild intra  and extrahepatic biliary ductal dilation may simply represent post cholecystectomy change. Liver otherwise unremarkable. Pancreas: Unremarkable Spleen: Unremarkable Adrenals/Urinary Tract: The kidneys are normal in size and position. Simple cortical cyst arises exophytically from the lateral interpolar region of the right kidney. Stable 3 mm nonobstructing calculus within the interpolar region of the left kidney. The kidneys are otherwise unremarkable. The bladder is unremarkable. Stomach/Bowel: There is a roughly 5 cm length segment of ischemic bowel involving the terminal sigmoid colon with mucosal non enhancement, best seen on image # 68/2. There is no associated pneumatosis or extraluminal gas to suggest perforation. The remainder of the small and large bowel demonstrates normal enhancement. Given its focality and somewhat unusual location, differential should include the sequela of arterial embolization or radiation colitis in the appropriate clinical setting. The stomach, small bowel, and large bowel are otherwise unremarkable. No evidence of obstruction. Appendix normal. No free intraperitoneal gas or fluid. Vascular/Lymphatic: There is extensive mixed atherosclerotic plaque throughout the infrarenal abdominal aorta and iliofemoral arterial vasculature. There is a high-grade focal stenosis of the inferior mesenteric artery at its origin, best seen on sagittal image # 47/5. There is segmental occlusion of the right common and proximal external iliac arteries with reconstitution of the right common femoral artery via the epigastric arcade. Left-sided IVC present. No pathologic adenopathy within the abdomen and pelvis. Reproductive: Prostate is unremarkable. Other: No abdominal wall hernia. Musculoskeletal: No acute bone abnormality. No lytic or blastic bone lesion. IMPRESSION: Short-segment ischemic colitis involving the terminal sigmoid colon. Given the relative focality and unusual location, additional  considerations should include  arterial embolism and radiation colitis in the appropriate clinical setting (i.e. Was this segment of bowel within the treatment zone). Mild coronary artery calcification. Minimal nonobstructing left nephrolithiasis. Status post cholecystectomy with probable post cholecystectomy change involving the biliary tree. Variant anatomy with left-sided IVC. Peripheral vascular disease with high-grade stenosis of the inferior mesenteric artery and segmental occlusion of the right lower extremity arterial inflow with reconstitution via the epigastric arcade. Aortic Atherosclerosis (ICD10-I70.0). Electronically Signed   By: Fidela Salisbury M.D.   On: 08/25/2021 02:51    Anti-infectives: Anti-infectives (From admission, onward)    Start     Dose/Rate Route Frequency Ordered Stop   08/25/21 1200  piperacillin-tazobactam (ZOSYN) IVPB 3.375 g        3.375 g 12.5 mL/hr over 240 Minutes Intravenous Every 8 hours 08/25/21 0813     08/25/21 0430  piperacillin-tazobactam (ZOSYN) IVPB 3.375 g        3.375 g 100 mL/hr over 30 Minutes Intravenous  Once 08/25/21 0427 08/25/21 0604       Assessment/Plan: Wesley [name] is a 75 yo with acute / on chronic ischemic colitis versus radiation changes as he has been having some issues for several weeks. He had a BM yesterday. Pain continues. NPO for now IV antibiotics IV heparin gtt Prob need to get flex sig to document what is going on given issues and findings on CT   Discussed with Dr. Manuella Ghazi.    LOS: 1 day    Wesley [name] 08/26/2021

## 2021-08-26 NOTE — Progress Notes (Signed)
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   08/24/2021   Loperamide HCl (IMODIUM A-D PO) Take 1 tablet by mouth daily as needed (loose stools).   08/24/2021   ALPRAZolam (XANAX) 0.5 MG tablet Take 1 tablet (0.5 mg total) by mouth at bedtime as needed for anxiety. (Patient not taking: No sig reported) 30 tablet 1 Not Taking   potassium chloride SA (K-DUR) 20 MEQ tablet Take 1 tablet (20 mEq total) by mouth daily. (Patient not taking: No sig reported) 14 tablet 0 Not Taking   zolpidem (AMBIEN) 5 MG tablet Take 1-2 tablets (5-10 mg total) by mouth at bedtime as needed for sleep. 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 Patient is not on anticoagulation prior to admission.  CBC WNL  Heparin level came back therapeutic this AM.   Goal of Therapy:  Heparin level 0.3-0.7 units/ml Monitor platelets by anticoagulation protocol: Yes   Plan:   Cont heparin infusion 1100 units/hr Check anti-Xa level daily. Continue to monitor H&H and platelets.  Onnie Boer, PharmD, BCIDP, AAHIVP, CPP Infectious Disease Pharmacist 08/26/2021 2:28 AM

## 2021-08-26 NOTE — Progress Notes (Signed)
Placed 14 french foley per MD order. Patient tolerated well, sterile technique maintained. Nurse tech assisted this nurse with insertion. 600 of clear yellow urine returned.

## 2021-08-26 NOTE — Progress Notes (Signed)
At 1100 patient unable to empty bladder completely. This nurse bladder scanned patient. Bladder scan showed greater than 200. MD made aware awaiting for response.

## 2021-08-26 NOTE — Progress Notes (Signed)
At 0500, pt was anxious, concerned about his health, family situation, and future. Patient complained of nausea and pain like a "knife in the stomach". RN gave PRN Dilaudid, Zofran, Xanax. Patient resting comfortably. Patient drank 236 mL Ginger Ale overnight.

## 2021-08-27 ENCOUNTER — Inpatient Hospital Stay (HOSPITAL_COMMUNITY): Payer: Medicare HMO | Admitting: Anesthesiology

## 2021-08-27 ENCOUNTER — Encounter (HOSPITAL_COMMUNITY): Payer: Self-pay | Admitting: Internal Medicine

## 2021-08-27 ENCOUNTER — Encounter (HOSPITAL_COMMUNITY)
Admission: EM | Disposition: A | Discharge status: Skilled Nursing Facility | Payer: Self-pay | Source: Home / Self Care | Attending: Family Medicine

## 2021-08-27 DIAGNOSIS — Z515 Encounter for palliative care: Secondary | ICD-10-CM

## 2021-08-27 DIAGNOSIS — K55039 Acute (reversible) ischemia of large intestine, extent unspecified: Secondary | ICD-10-CM | POA: Diagnosis not present

## 2021-08-27 DIAGNOSIS — K921 Melena: Secondary | ICD-10-CM

## 2021-08-27 DIAGNOSIS — F0631 Mood disorder due to known physiological condition with depressive features: Secondary | ICD-10-CM

## 2021-08-27 DIAGNOSIS — Z7189 Other specified counseling: Secondary | ICD-10-CM | POA: Diagnosis not present

## 2021-08-27 HISTORY — PX: FLEXIBLE SIGMOIDOSCOPY: SHX5431

## 2021-08-27 HISTORY — PX: BIOPSY: SHX5522

## 2021-08-27 LAB — GLUCOSE, CAPILLARY
Glucose-Capillary: 57 mg/dL — ABNORMAL LOW (ref 70–99)
Glucose-Capillary: 115 mg/dL — ABNORMAL HIGH (ref 70–99)
Glucose-Capillary: 122 mg/dL — ABNORMAL HIGH (ref 70–99)
Glucose-Capillary: 99 mg/dL (ref 70–99)
Glucose-Capillary: 89 mg/dL (ref 70–99)

## 2021-08-27 LAB — COMPREHENSIVE METABOLIC PANEL
ALT: 12 U/L (ref 0–44)
AST: 18 U/L (ref 15–41)
Albumin: 2.5 g/dL — ABNORMAL LOW (ref 3.5–5.0)
Alkaline Phosphatase: 54 U/L (ref 38–126)
Anion gap: 11 (ref 5–15)
BUN: 10 mg/dL (ref 8–23)
CO2: 16 mmol/L — ABNORMAL LOW (ref 22–32)
Calcium: 7.7 mg/dL — ABNORMAL LOW (ref 8.9–10.3)
Chloride: 106 mmol/L (ref 98–111)
Creatinine, Ser: 0.81 mg/dL (ref 0.61–1.24)
GFR, Estimated: >60 mL/min (ref >60)
Glucose, Bld: 64 mg/dL — ABNORMAL LOW (ref 70–99)
Potassium: 3.5 mmol/L (ref 3.5–5.1)
Sodium: 133 mmol/L — ABNORMAL LOW (ref 135–145)
Total Bilirubin: 1.1 mg/dL (ref 0.3–1.2)
Total Protein: 5.3 g/dL — ABNORMAL LOW (ref 6.5–8.1)

## 2021-08-27 LAB — CBC
HCT: 35 % — ABNORMAL LOW (ref 39.0–52.0)
Hemoglobin: 11.4 g/dL — ABNORMAL LOW (ref 13.0–17.0)
MCH: 30 pg (ref 26.0–34.0)
MCHC: 32.6 g/dL (ref 30.0–36.0)
MCV: 92.1 fL (ref 80.0–100.0)
Platelets: 170 10*3/uL (ref 150–400)
RBC: 3.8 MIL/uL — ABNORMAL LOW (ref 4.22–5.81)
RDW: 13.5 % (ref 11.5–15.5)
WBC: 10.7 10*3/uL — ABNORMAL HIGH (ref 4.0–10.5)
nRBC: 0 % (ref 0.0–0.2)

## 2021-08-27 LAB — MAGNESIUM: Magnesium: 1.8 mg/dL (ref 1.7–2.4)

## 2021-08-27 LAB — HEPARIN LEVEL (UNFRACTIONATED): Heparin Unfractionated: 0.33 IU/mL (ref 0.30–0.70)

## 2021-08-27 SURGERY — SIGMOIDOSCOPY, FLEXIBLE
Anesthesia: General

## 2021-08-27 MED ORDER — SODIUM CHLORIDE 0.9 % IV SOLN: INTRAVENOUS | Status: DC

## 2021-08-27 MED ORDER — PHENYLEPHRINE 40 MCG/ML (10ML) SYRINGE FOR IV PUSH (FOR BLOOD PRESSURE SUPPORT)
PREFILLED_SYRINGE | INTRAVENOUS | Status: DC | PRN
  Administered 2021-08-27 (×2): 80 ug via INTRAVENOUS

## 2021-08-27 MED ORDER — METOCLOPRAMIDE HCL 5 MG/ML IJ SOLN
INTRAMUSCULAR | Status: AC
  Filled 2021-08-27: qty 2

## 2021-08-27 MED ORDER — FENTANYL CITRATE PF 50 MCG/ML IJ SOSY
PREFILLED_SYRINGE | INTRAMUSCULAR | Status: AC
  Administered 2021-08-27: 50 ug
  Filled 2021-08-27: qty 1

## 2021-08-27 MED ORDER — LIDOCAINE HCL (CARDIAC) PF 100 MG/5ML IV SOSY
PREFILLED_SYRINGE | INTRAVENOUS | Status: DC | PRN
  Administered 2021-08-27: 50 mg via INTRAVENOUS

## 2021-08-27 MED ORDER — DEXTROSE 50 % IV SOLN
INTRAVENOUS | Status: AC
  Administered 2021-08-27: 50 mL
  Filled 2021-08-27: qty 50

## 2021-08-27 MED ORDER — METOCLOPRAMIDE HCL 5 MG/ML IJ SOLN
10.0000 mg | Freq: Once | INTRAMUSCULAR | Status: AC
  Administered 2021-08-27: 10 mg via INTRAVENOUS

## 2021-08-27 MED ORDER — FENTANYL CITRATE (PF) 100 MCG/2ML IJ SOLN
50.0000 ug | Freq: Once | INTRAMUSCULAR | Status: DC
Start: 2021-08-27 — End: 2021-08-27

## 2021-08-27 MED ORDER — PROPOFOL 10 MG/ML IV BOLUS
INTRAVENOUS | Status: DC | PRN
Start: 2021-08-27 — End: 2021-08-27
  Administered 2021-08-27: 30 mg via INTRAVENOUS
  Administered 2021-08-27: 70 mg via INTRAVENOUS

## 2021-08-27 MED ORDER — FENTANYL CITRATE (PF) 100 MCG/2ML IJ SOLN: 50.0000 ug | Freq: Once | INTRAMUSCULAR | Status: DC

## 2021-08-27 MED ORDER — LACTATED RINGERS IV SOLN: INTRAVENOUS | Status: DC

## 2021-08-27 MED ORDER — DEXTROSE-NACL 5-0.9 % IV SOLN: INTRAVENOUS | Status: DC

## 2021-08-27 MED ORDER — FENTANYL CITRATE PF 50 MCG/ML IJ SOSY
50.0000 ug | PREFILLED_SYRINGE | Freq: Once | INTRAMUSCULAR | Status: AC
Start: 2021-08-27 — End: 2021-08-27

## 2021-08-27 NOTE — Consult Note (Signed)
Telepsych Consultation   Reason for Consult:  severe depression..suicidal ideation evaluation Referring Physician:  Heath Lark, MD Location of Patient:  [facility] A337-1 Location of Provider: [place] Department  Patient Identification: Wesley [name] MRN:  [id number] Principal Diagnosis: <principal problem not specified> Diagnosis:  Active Problems:   Acute ischemic colitis [facility])   Total Time spent with patient: 20 minutes  Subjective:   Wesley [name] is a 75 y.o. male patient admitted with ischemic colitis.   Patient presents alert, hard of hearing, RN at the bedside to assist in translating questions to patient due to virtual setting of assessment. Has remote history of depression; not consistent with medications (Xanax, Zoloft) in the past to see any results. States patient does have previously hospitalized for possibly depression related illnes >20 years ago in [facility]. Denies any history or active thoughts of suicidal or homicidal, auditory or visual hallucinations.  Denies any safety concerns. States he is willing to restart anti-depressant medication. Was started recently on anti-depressant medication Zoloft. Per notes from this morning was seen by medical team and will be having a flexible sigmoidoscopy planned for further evaluation and to guide decisions with general surgery/gastroenterology. Has been requested to have a capacity evaluation.  Per RN note 08/26/21 0659 "At 0500, pt was anxious, concerned about his health, family situation, and future. Patient complained of nausea and pain like a "knife in the stomach". RN gave PRN Dilaudid, Zofran, Xanax. Patient resting comfortably. Patient drank 236 mL Ginger Ale overnight."  Per RN note 08/25/21 3570 "Patient has made mention of wanting to "be done with life" and has said "I wish you would give me enough pain medicine so I could go to sleep and never wake up" in front of this nurse. Patient's sister and  daughter spoke with this nurse and also expressed concern for patient. They state that he has been negative since he was a child, but has gotten much worse since the unexpected death of his wife. Patient is not on any antidepressant. Concerned for patient's mental health, this RN reached out to Dr. Manuella Ghazi and requested a psych consult at this time  Per assessment 08/27/21 to evaluate capacity to make medical decisions:  Assessment somewhat limited due to patient's problems with hearing. Patient reports that after talking to the medical team today he is agreeable to accepting treatment. His main concern is not "recovering well from surgery because I have been through that before. I really don't want to leave my children behind. They are all that I have now." He denies any acute suicidal ideation. He admits that he would rather "not live with pain." But denies any planned intent to harm himself. He is worried that "They don't have a cure for cancer. I just don't want to go through more misery having more procedures." He appears to have chosen to accept treatment for his medical problems.   He appears to understand the relevant information related to his healthcare. He stated "They are going to put a tube in my mouth to take a closer look at my stomach." He appeared to understand the risks and benefits of not having treatment. Stating "I am 75 years old. I have had a hard life. But I'm not ready to leave my children if they can help me to live."  He appeared to appreciate his medical condition and the likely consequences of treatment options. He appeared to be mainly concerned with the treatment not working and having continued symptoms that might worsen  over time. He also discussed feeling judged because of "being a smoker since I was 16. That is one of the few things I have had to enjoy."   After speaking with the medical tem earlier today patient appears to have accepted the recommended treatment. At this  time patient appears to have capacity for medical decision making. He is able to recognize that "my life may be cut short if I don't accept what the medical team can do. 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, radiation and chemo 2019   Wears dentures    full upper and lower   Wears hearing aid in both ears     Past Surgical History:  Procedure Laterality Date   CATARACT EXTRACTION W/PHACO Left 05/17/2020   Procedure: CATARACT EXTRACTION PHACO AND INTRAOCULAR LENS PLACEMENT (I[facility]) LEFT 8.43  00:57.1  14.7%;  Surgeon: Leandrew Koyanagi, MD;  Location: [facility];  Service: Ophthalmology;  Laterality: Left;   CATARACT EXTRACTION W/PHACO Right 07/19/2020   Procedure: CATARACT EXTRACTION PHACO AND INTRAOCULAR LENS PLACEMENT ([place]) RIGHT MALYUGIN;  Surgeon: Leandrew Koyanagi, MD;  Location: [facility];  Service: Ophthalmology;  Laterality: Right;  12.59, 01:23.4, 15.1%   CHOLECYSTECTOMY     EUS N/A 10/31/2017   Dr. Benson Norway: hypoechoic mass in rectum, partiall circumferential, tattoeed.   Family History:  Family History  Problem Relation Age of Onset   Cancer Mother        Kidney   Diabetes Mother    Cancer Paternal Uncle        colon   Cancer Paternal Aunt 2       breast cancer   Hyperlipidemia Neg Hx    Hypertension Neg Hx    Stroke Neg Hx    Family Psychiatric  History: not noted Social History:  Social History   Substance and Sexual Activity  Alcohol Use No   Alcohol/week: 0.0 standard drinks     Social History   Substance and Sexual  Activity  Drug Use No    Social History   Socioeconomic History   Marital status: Widowed    Spouse name: Not on file   Number of children: 3   Years of education: Not on file   Highest education level: Not on file  Occupational History   Occupation: truck driver  Tobacco Use   Smoking status: Every Day    Packs/day: 1.00    Years: 56.00    Pack years: 56.00    Types: Cigarettes   Smokeless tobacco: Never   Tobacco comments:    started smoking age 71  Vaping Use   Vaping Use: Never used  Substance and Sexual Activity   Alcohol use: No    Alcohol/week: 0.0 standard drinks   Drug use: No   Sexual activity: Not Currently  Other Topics Concern   Not on file  Social History Narrative   Not on file   Social Determinants of Health   Financial Resource Strain: Not on file  Food Insecurity: Not  on file  Transportation Needs: Not on file  Physical Activity: Not on file  Stress: Not on file  Social Connections: Not on file   Additional Social History:    Allergies:  No Known Allergies  Labs:  Results for orders placed or performed during the hospital encounter of 08/24/21 (from the past 48 hour(s))  Heparin level (unfractionated)     Status: Abnormal   Collection Time: 08/25/21  3:15 PM  Result Value Ref Range   Heparin Unfractionated 0.27 (L) 0.30 - 0.70 IU/mL    Comment: (NOTE) The clinical reportable range upper limit is being lowered to >1.10 to align with the FDA approved guidance for the current laboratory assay.  If heparin results are below expected values, and patient dosage has  been confirmed, suggest follow up testing of antithrombin III levels. Performed at [facility], [address], [place], [place] [address]   Magnesium     Status: None   Collection Time: 08/26/21  1:18 AM  Result Value Ref Range   Magnesium 1.8 1.7 - 2.4 mg/dL    Comment: Performed at [facility], [address], [place], [place] [address]  Comprehensive metabolic panel      Status: Abnormal   Collection Time: 08/26/21  1:18 AM  Result Value Ref Range   Sodium 131 (L) 135 - 145 mmol/L   Potassium 3.5 3.5 - 5.1 mmol/L   Chloride 102 98 - 111 mmol/L   CO2 22 22 - 32 mmol/L   Glucose, Bld 82 70 - 99 mg/dL    Comment: Glucose reference range applies only to samples taken after fasting for at least 8 hours.   BUN 12 8 - 23 mg/dL   Creatinine, Ser 0.79 0.61 - 1.24 mg/dL   Calcium 7.7 (L) 8.9 - 10.3 mg/dL   Total Protein 6.1 (L) 6.5 - 8.1 g/dL   Albumin 3.0 (L) 3.5 - 5.0 g/dL   AST 18 15 - 41 U/L   ALT 12 0 - 44 U/L   Alkaline Phosphatase 55 38 - 126 U/L   Total Bilirubin 1.1 0.3 - 1.2 mg/dL   GFR, Estimated >60 >60 mL/min    Comment: (NOTE) Calculated using the CKD-EPI Creatinine Equation (2021)    Anion gap 7 5 - 15    Comment: Performed at [facility], [address] [place] [address], [place], [place] [address]  CBC     Status: Abnormal   Collection Time: 08/26/21  1:18 AM  Result Value Ref Range   WBC 12.4 (H) 4.0 - 10.5 K/uL   RBC 4.16 (L) 4.22 - 5.81 MIL/uL   Hemoglobin 12.9 (L) 13.0 - 17.0 g/dL   HCT 38.3 (L) 39.0 - 52.0 %   MCV 92.1 80.0 - 100.0 fL   MCH 31.0 26.0 - 34.0 pg   MCHC 33.7 30.0 - 36.0 g/dL   RDW 13.7 11.5 - 15.5 %   Platelets 177 150 - 400 K/uL   nRBC 0.0 0.0 - 0.2 %    Comment: Performed at [facility], [address] [place], [place] [address]  Heparin level (unfractionated)     Status: None   Collection Time: 08/26/21  1:18 AM  Result Value Ref Range   Heparin Unfractionated 0.55 0.30 - 0.70 IU/mL    Comment: (NOTE) The clinical reportable range upper limit is being lowered to >1.10 to align with the FDA approved guidance for the current laboratory assay.  If heparin results are below expected values, and patient dosage has  been confirmed,  suggest follow up testing of antithrombin III levels. Performed at [facility], [address] [place], [place] [address]   Heparin level (unfractionated)     Status: None   Collection Time:  08/27/21  6:02 AM  Result Value Ref Range   Heparin Unfractionated 0.33 0.30 - 0.70 IU/mL    Comment: (NOTE) The clinical reportable range upper limit is being lowered to >1.10 to align with the FDA approved guidance for the current laboratory assay.  If heparin results are below expected values, and patient dosage has  been confirmed, suggest follow up testing of antithrombin III levels. Performed at [facility], [address] [place], [place] [address]   CBC     Status: Abnormal   Collection Time: 08/27/21  6:02 AM  Result Value Ref Range   WBC 10.7 (H) 4.0 - 10.5 K/uL   RBC 3.80 (L) 4.22 - 5.81 MIL/uL   Hemoglobin 11.4 (L) 13.0 - 17.0 g/dL   HCT 35.0 (L) 39.0 - 52.0 %   MCV 92.1 80.0 - 100.0 fL   MCH 30.0 26.0 - 34.0 pg   MCHC 32.6 30.0 - 36.0 g/dL   RDW 13.5 11.5 - 15.5 %   Platelets 170 150 - 400 K/uL   nRBC 0.0 0.0 - 0.2 %    Comment: Performed at [facility], [address], [place], [place] [address]  Comprehensive metabolic panel     Status: Abnormal   Collection Time: 08/27/21  6:02 AM  Result Value Ref Range   Sodium 133 (L) 135 - 145 mmol/L   Potassium 3.5 3.5 - 5.1 mmol/L   Chloride 106 98 - 111 mmol/L   CO2 16 (L) 22 - 32 mmol/L   Glucose, Bld 64 (L) 70 - 99 mg/dL    Comment: Glucose reference range applies only to samples taken after fasting for at least 8 hours.   BUN 10 8 - 23 mg/dL   Creatinine, Ser 0.81 0.61 - 1.24 mg/dL   Calcium 7.7 (L) 8.9 - 10.3 mg/dL   Total Protein 5.3 (L) 6.5 - 8.1 g/dL   Albumin 2.5 (L) 3.5 - 5.0 g/dL   AST 18 15 - 41 U/L   ALT 12 0 - 44 U/L   Alkaline Phosphatase 54 38 - 126 U/L   Total Bilirubin 1.1 0.3 - 1.2 mg/dL   GFR, Estimated >60 >60 mL/min    Comment: (NOTE) Calculated using the CKD-EPI Creatinine Equation (2021)    Anion gap 11 5 - 15    Comment: Performed at [facility], [address], [place], [place] [address]  Magnesium     Status: None   Collection Time: 08/27/21  6:02 AM  Result Value Ref Range    Magnesium 1.8 1.7 - 2.4 mg/dL    Comment: Performed at [facility], [address], [place], [place] [address]  Glucose, capillary     Status: Abnormal   Collection Time: 08/27/21  9:43 AM  Result Value Ref Range   Glucose-Capillary 57 (L) 70 - 99 mg/dL    Comment: Glucose reference range applies only to samples taken after fasting for at least 8 hours.  Glucose, capillary     Status: Abnormal   Collection Time: 08/27/21 11:33 AM  Result Value Ref Range   Glucose-Capillary 122 (H) 70 - 99 mg/dL    Comment: Glucose reference range applies only to samples taken after fasting for at least 8 hours.  Glucose, capillary     Status: Abnormal   Collection  Time: 08/27/21 11:47 AM  Result Value Ref Range   Glucose-Capillary 115 (H) 70 - 99 mg/dL    Comment: Glucose reference range applies only to samples taken after fasting for at least 8 hours.    Medications:  Current Facility-Administered Medications  Medication Dose Route Frequency Provider Last Rate Last Admin   acetaminophen (TYLENOL) tablet 650 mg  650 mg Oral Q6H PRN Manuella Ghazi, Pratik D, DO       Or   acetaminophen (TYLENOL) suppository 650 mg  650 mg Rectal Q6H PRN Manuella Ghazi, Pratik D, DO       ALPRAZolam Duanne Moron) tablet 0.5 mg  0.5 mg Oral QHS PRN Manuella Ghazi, Pratik D, DO   0.5 mg at 08/26/21 0510   Chlorhexidine Gluconate Cloth 2 % PADS 6 each  6 each Topical Daily Heath Lark D, DO   6 each at 08/27/21 0904   dextrose 5 %-0.9 % sodium chloride infusion   Intravenous Continuous Heath Lark D, DO 75 mL/hr at 08/27/21 1127 New Bag at 08/27/21 1127   heparin ADULT infusion 100 units/mL (25000 units/249mL)  1,100 Units/hr Intravenous Continuous Manuella Ghazi, Pratik D, DO   Stopped at 08/27/21 1045   HYDROmorphone (DILAUDID) injection 0.5-1 mg  0.5-1 mg Intravenous Q2H PRN Manuella Ghazi, Pratik D, DO   1 mg at 08/27/21 3790   ondansetron (ZOFRAN) tablet 4 mg  4 mg Oral Q6H PRN Manuella Ghazi, Pratik D, DO       Or   ondansetron [facility]) injection 4 mg  4 mg Intravenous Q6H PRN  Manuella Ghazi, Pratik D, DO   4 mg at 08/26/21 2409   piperacillin-tazobactam (ZOSYN) IVPB 3.375 g  3.375 g Intravenous Q8H Shah, Pratik D, DO 12.5 mL/hr at 08/27/21 1130 3.375 g at 08/27/21 1130   sertraline (ZOLOFT) tablet 25 mg  25 mg Oral Daily Leevy-Johnson, Brooke A, NP   25 mg at 08/27/21 0903    Musculoskeletal: Strength & Muscle Tone: decreased Gait & Station:  not assessed Patient leans:  patient sitting up in bed  Psychiatric Specialty Exam:  Presentation  General Appearance:  Appropriate for Environment Eye Contact: Minimal Speech: Other (comment) (low volume; pt is HOH) Speech Volume: Decreased Handedness: No data recorded  Mood and Affect  Mood: Dysphoric Affect: Flat  Thought Process  Thought Processes: Coherent Descriptions of Associations:Intact Orientation:Partial Thought Content:Logical History of Schizophrenia/Schizoaffective disorder:No data recorded Duration of Psychotic Symptoms:No data recorded Hallucinations:Hallucinations: None Ideas of Reference:None Suicidal Thoughts:Suicidal Thoughts: No Homicidal Thoughts:Homicidal Thoughts: No  Sensorium  Memory: Immediate Fair; Recent Fair; Remote Fair Judgment: Other (comment) (situational) Insight: Shallow  Executive Functions  Concentration: Fair Attention Span: Fair Recall: [employer] of Knowledge: Fair Language: Fair  Psychomotor Activity  Psychomotor Activity: Psychomotor Activity: Normal  Assets  Assets: Financial Resources/Insurance; Housing; Resilience; Social Support  Sleep  Sleep: Sleep: Poor   Physical Exam: Physical Exam Vitals and nursing note reviewed.  Constitutional:      Appearance: He is ill-appearing.  HENT:     Head: Normocephalic.     Ears:     Comments: Hard of hearing    Nose: Nose normal.  Eyes:     Pupils: Pupils are equal, round, and reactive to light.  Cardiovascular:     Rate and Rhythm: Normal rate.  Pulmonary:     Effort: Pulmonary effort is  normal.  Musculoskeletal:        General: Normal range of motion.  Neurological:     Mental Status: He is alert. Mental status is at baseline.  Psychiatric:        Attention and Perception: Perception normal.        Mood and Affect: Mood is depressed. Affect is flat.        Speech: Speech normal.        Behavior: Behavior is withdrawn.        Thought Content: Thought content is not paranoid or delusional. Thought content does not include homicidal or suicidal ideation. Thought content does not include homicidal or suicidal plan.        Cognition and Memory: Cognition normal.     Comments: Impaired hearing   Review of Systems  Psychiatric/Behavioral:  Positive for depression. Negative for hallucinations and suicidal ideas. The patient has insomnia.   All other systems reviewed and are negative. Blood pressure (!) 93/53, pulse 76, temperature 98.2 F (36.8 C), temperature source Oral, resp. rate 18, height 5\' 6"  (1.676 m), weight 60 kg, SpO2 91 %. Body mass index is 21.35 kg/m.  Treatment Plan Summary: Continue Zoloft 25 mg daily for depressive symptoms placed. Recently started by psychiatry 08/26/21.   Disposition: Patient does not meet criteria for psychiatric inpatient admission. Supportive therapy provided about ongoing stressors. Discussed crisis plan, support from social network, calling 911, coming to the Emergency Department, and calling Suicide Hotline.  This service was provided via telemedicine using a 2-way, interactive audio and video technology.  Names of all persons participating in this telemedicine service and their role in this encounter. Name: Elmarie Shiley  Role: PMHNP  Name:  Role:   Name: Wesley [name] Role: patient  Name:  Role:    Elmarie Shiley, NP 08/27/2021 1:02 PM

## 2021-08-27 NOTE — Anesthesia Postprocedure Evaluation (Signed)
Anesthesia Post Note  Patient: Wesley [name]  Procedure(s) Performed: FLEXIBLE SIGMOIDOSCOPY WITH PROPOFOL BIOPSY  Patient location during evaluation: PACU Anesthesia Type: General Level of consciousness: awake and alert and oriented Pain management: pain level controlled Vital Signs Assessment: post-procedure vital signs reviewed and stable Respiratory status: spontaneous breathing and respiratory function stable Cardiovascular status: blood pressure returned to baseline and stable Postop Assessment: no apparent nausea or vomiting Anesthetic complications: no   No notable events documented.   Last Vitals:  Vitals:   08/27/21 1536 08/27/21 1545  BP: (!) 91/45 (!) 98/47  Pulse: 74 75  Resp: 10 14  Temp: 36.9 C   SpO2: 94% 96%    Last Pain:  Vitals:   08/27/21 1545  TempSrc:   PainSc: 0-No pain                 Suha Schoenbeck C Oliverio Cho

## 2021-08-27 NOTE — Plan of Care (Signed)
  Problem: Education: Goal: Knowledge of General Education information will improve Description: Including pain rating scale, medication(s)/side effects and non-pharmacologic comfort measures Outcome: Progressing   Problem: Clinical Measurements: Goal: Ability to maintain clinical measurements within normal limits will improve Outcome: Progressing   

## 2021-08-27 NOTE — Transfer of Care (Signed)
Immediate Anesthesia Transfer of Care Note  Patient: Wesley [name]  Procedure(s) Performed: FLEXIBLE SIGMOIDOSCOPY WITH PROPOFOL BIOPSY  Patient Location: PACU  Anesthesia Type:General  Level of Consciousness: drowsy  Airway & Oxygen Therapy: Patient Spontanous Breathing and Patient connected to nasal cannula oxygen  Post-op Assessment: Report given to RN and Post -op Vital signs reviewed and stable  Post vital signs: Reviewed and stable  Last Vitals:  Vitals Value Taken Time  BP 91/45 08/27/21 1536  Temp    Pulse 74 08/27/21 1538  Resp 14 08/27/21 1538  SpO2 94 % 08/27/21 1538  Vitals shown include unvalidated device data.  Last Pain:  Vitals:   08/27/21 1504  TempSrc: Oral  PainSc: 8       Patients Stated Pain Goal: 3 (29/51/88 4166)  Complications: No notable events documented.

## 2021-08-27 NOTE — Op Note (Signed)
[facility] Patient Name: Wesley [name] Procedure Date: 08/27/2021 2:35 PM MRN: [id number] Date of Birth: Mar 12, 1946 Attending MD: Norvel Richards , MD CSN: [id number] Age: 75 Admit Type: Inpatient Procedure:                Flexible Sigmoidoscopy Indications:              Hematochezia, Abnormal CT of the GI tract Providers:                Norvel Richards, MD, Caprice Kluver, Aram Candela Referring MD:              Medicines:                Propofol per Anesthesia Complications:            No immediate complications. Estimated Blood Loss:     Estimated blood loss was minimal. Procedure:                Pre-Anesthesia Assessment:                           - Prior to the procedure, a History and Physical                            was performed, and patient medications and                            allergies were reviewed. The patient's tolerance of                            previous anesthesia was also reviewed. The risks                            and benefits of the procedure and the sedation                            options and risks were discussed with the patient.                            All questions were answered, and informed consent                            was obtained. Prior Anticoagulants: The patient                            last took heparin on the day of the procedure. ASA                            Grade Assessment: IV - A patient with severe                            systemic disease that is a constant threat to life.                            After reviewing the risks and benefits, the patient  was deemed in satisfactory condition to undergo the                            procedure.                           After obtaining informed consent, the scope was                            passed under direct vision. The [phone])                            scope was introduced through the anus and advanced                             to the the rectosigmoid junction. The flexible                            sigmoidoscopy was accomplished without difficulty.                            The patient tolerated the procedure well. The                            quality of the bowel preparation was adequate. Scope In: 3:22:51 PM Scope Out: 3:30:59 PM Total Procedure Duration: 0 hours 8 minutes 8 seconds  Findings:      Friable rectal mucosa with prominent neovascular changes from the anal       verge to 10 cm; from 10 cm to 20 cm from the anal verge with       circumferential deep ulceration of the rectal mucosa; mucosa at this       level appeared dusky and ischemic.      At 20 cm from the anal verge, there was a 4 mm tight stenosis through       which I could not admit the scope. 2 biopsies from the proximal rectal       segment were taken for histologic study. Impression:               -                           Sigmoidoscopy to 20 cm.; rectal mucosa - distal 10                            cm appeared friable with prominent neovascular                            changes; rectal mucosa to distal sigmoid (10-20 cm                            from the anal verge) appeared exuberantly inflamed                            with deep circumferential ulceration with  underlying dusky appearing mucosa. 4 mm stricture                            at 20 cm from anal verge precluded further                            advancement of the scope. Biopsies taken.                           -. I suspect rectosigmoid findings are result of                            both radiation and ischemia effect. There is no                            good medical therapy for this finding. Moderate Sedation:      Moderate (conscious) sedation was personally administered by an       anesthesia professional. The following parameters were monitored: oxygen       saturation, heart rate, blood pressure, respiratory  rate, EKG, adequacy       of pulmonary ventilation, and response to care. Recommendation:           - Clear liquid diet. Discussed with Dr. Constance Haw.                            Agree with plans for procto-colectomy with                            permanent colostomy. At patient request, I called                            his sister, Wesley [name], at [phone]. Call rolled                            to voicemail. I left a detailed message with my                            findings and recommendations. Procedure Code(s):        --- Professional ---                           [phone], 52, Sigmoidoscopy, flexible; diagnostic,                            including collection of specimen(s) by brushing or                            washing, when performed (separate procedure) Diagnosis Code(s):        --- Professional ---                           K92.1, Melena (includes Hematochezia)                           R93.3, Abnormal  findings on diagnostic imaging of                            other parts of digestive tract CPT copyright 2019 American Medical Association. All rights reserved. The codes documented in this report are preliminary and upon coder review may  be revised to meet current compliance requirements. Cristopher Estimable. Ameliyah Sarno, MD Norvel Richards, MD 08/27/2021 3:59:11 PM This report has been signed electronically. Number of Addenda: 0

## 2021-08-27 NOTE — Anesthesia Procedure Notes (Signed)
Date/Time: 08/27/2021 3:24 PM Performed by: Orlie Dakin, CRNA Pre-anesthesia Checklist: Patient identified, Emergency Drugs available, Suction available and Patient being monitored Patient Re-evaluated:Patient Re-evaluated prior to induction Oxygen Delivery Method: Nasal cannula Induction Type: IV induction Placement Confirmation: positive ETCO2

## 2021-08-27 NOTE — Progress Notes (Signed)
ANTICOAGULATION CONSULT NOTE -  Pharmacy Consult for heparin Indication: possible meseternic stenosis, embolism, ischemic colitis  No Known Allergies  Patient Measurements: Height: 5\' 6"  (167.6 cm) Weight: 60 kg (132 lb 4.4 oz) IBW/kg (Calculated) : 63.8 Heparin Dosing Weight: 60 kg  Vital Signs: Temp: 98.2 F (36.8 C) (09/26 0547) Temp Source: Oral (09/26 0547) BP: 93/53 (09/26 0547) Pulse Rate: 76 (09/26 0547)  Labs: Recent Labs    08/25/21 0100 08/25/21 1515 08/26/21 0118 08/27/21 0602  HGB 15.1  --  12.9* 11.4*  HCT 44.3  --  38.3* 35.0*  PLT 198  --  177 170  HEPARINUNFRC  --  0.27* 0.55 0.33  CREATININE 0.77  --  0.79 0.81     Estimated Creatinine Clearance: 66.9 mL/min (by C-G formula based on SCr of 0.81 mg/dL).   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   08/24/2021   Loperamide HCl (IMODIUM A-D PO) Take 1 tablet by mouth daily as needed (loose stools).   08/24/2021   ALPRAZolam (XANAX) 0.5 MG tablet Take 1 tablet (0.5 mg total) by mouth at bedtime as needed for anxiety. (Patient not taking: No sig reported) 30 tablet 1 Not Taking   potassium chloride SA (K-DUR) 20 MEQ tablet Take 1 tablet (20 mEq total) by mouth daily. (Patient not taking: No sig reported) 14 tablet 0 Not Taking   zolpidem (AMBIEN) 5 MG tablet Take 1-2 tablets (5-10 mg total) by mouth at bedtime as needed for sleep. (Patient not taking: Reported on 08/25/2021) 15 tablet 0 Not Taking    Assessment: Pharmacy  consulted to dose heparin in patient with possible mesenteric stenosis/embolism. Patient is not on anticoagulation prior to admission.  CBC WNL  HL 0.33- therapeutic  Goal of Therapy:  Heparin level 0.3-0.7 units/ml Monitor platelets by anticoagulation protocol: Yes   Plan:  Cont heparin infusion 1100 units/hr Check anti-Xa level daily. Continue to monitor H&H and platelets.  Margot Ables, PharmD Clinical Pharmacist 08/27/2021 7:54 AM

## 2021-08-27 NOTE — Progress Notes (Signed)
PROGRESS NOTE    Wesley [name]  BHA:[id number] DOB: 29-Jan-1946 DOA: 08/24/2021 PCP: Jearld Fenton, NP    Brief Narrative: Wesley [name] is a 75 year old male the presents with sudden onset lower abd pain. He has a history of rectal adenocarcinoma, anxiety, tobacco use, previous chemo/radiation for his rectal adenocarcinoma. He reports that 2 days prior to his presentation, he had a bowel movement and subsequent unrelenting lower abdominal pain. He reports blood, dark red, in his stool when he did have a bowel movement. CT w contrast shows a section of ischemic bowel, terminal sigmoid colon, and focal stenosis of the IMA. Flexible sigmoidoscopy planned for further evaluation and to guide decisions with general surgery/gastroenterology.    Assessment & Plan:   Active Problems:   Acute ischemic colitis -ischemic portion of the sigmoid colon noted with ct w contrast -flexible sigmoidoscopy planned for establishing chronic vs acute -stenosis of IMA noted -heparin gtt started -discussion of next steps, including colostomy -pain control with hydromorphone -NPO -monitor h&h for signs of blood loss in stool -iv zosyn for potential for perforation  Anxiety & depression -alprazolam 0.5mg  qhs -Psychiatry involved      DVT prophylaxis: heparin gtt Code Status: DNR Family Communication: sister at bedside Disposition Plan: upon further input by gi and surgery as to treatment   Consultants:  General surgery Gastroenterology Psychiatry  Procedures:  Flexible sigmoidoscopy planned  Antimicrobials:  -zosyn 3.375g IV  Q 8hrs   Subjective: Patient is uncomfortable, not very communicative, hard of hearing, upset by his current condition  Objective: Vitals:   08/26/21 0607 08/26/21 1247 08/26/21 2117 08/27/21 0547  BP: (!) 114/53 (!) 113/57 (!) 107/57 (!) 93/53  Pulse: 71 67 77 76  Resp: 18 20 18 18   Temp: 98.4 F (36.9 C) 97.8 F (36.6 C) 98.1 F (36.7 C) 98.2 F  (36.8 C)  TempSrc: Oral Oral Oral Oral  SpO2: 93% 94% 94% 91%  Weight:      Height:        Intake/Output Summary (Last 24 hours) at 08/27/2021 1211 Last data filed at 08/27/2021 0600 Gross per 24 hour  Intake --  Output 900 ml  Net -900 ml   Filed Weights   08/24/21 2043  Weight: 60 kg    Examination: seated in bed, uncomfortable, difficult to engage in conversation  General exam: pink, warm, dry to the touch, alert, conscious, oriented to person, place, time, event.   Respiratory system: Clear to auscultation. Respiratory effort normal.  Cardiovascular system: S1 & S2 heard, RRR. No JVD, murmurs, rubs, gallops or clicks. No pedal edema.  Gastrointestinal system: Abdomen is diffusely tender to palpation, bowel sounds noted  Central nervous system: Alert and oriented. No focal neurological deficits.  Extremities: moves all extremities approriately  Skin: No obvious rashes or lesions  Psychiatry: Judgement and insight appear normal. Mood , unhappy with his current condition and difficult to engage    Data Reviewed: I have personally reviewed following labs and imaging studies  CBC: Recent Labs  Lab 08/25/21 0100 08/26/21 0118 08/27/21 0602  WBC 15.8* 12.4* 10.7*  HGB 15.1 12.9* 11.4*  HCT 44.3 38.3* 35.0*  MCV 91.5 92.1 92.1  PLT 198 177 973   Basic Metabolic Panel: Recent Labs  Lab 08/25/21 0100 08/26/21 0118 08/27/21 0602  NA 135 131* 133*  K 3.6 3.5 3.5  CL 101 102 106  CO2 26 22 16*  GLUCOSE 109* 82 64*  BUN 11 12 10   CREATININE 0.77  0.79 0.81  CALCIUM 9.0 7.7* 7.7*  MG  --  1.8 1.8   GFR: Estimated Creatinine Clearance: 66.9 mL/min (by C-G formula based on SCr of 0.81 mg/dL). Liver Function Tests: Recent Labs  Lab 08/25/21 0100 08/26/21 0118 08/27/21 0602  AST 19 18 18   ALT 13 12 12   ALKPHOS 63 55 54  BILITOT 1.1 1.1 1.1  PROT 7.2 6.1* 5.3*  ALBUMIN 3.8 3.0* 2.5*   Recent Labs  Lab 08/25/21 0100  LIPASE 21   No results for  input(s): AMMONIA in the last 168 hours. Coagulation Profile: No results for input(s): INR, PROTIME in the last 168 hours. Cardiac Enzymes: No results for input(s): CKTOTAL, CKMB, CKMBINDEX, TROPONINI in the last 168 hours. BNP (last 3 results) No results for input(s): PROBNP in the last 8760 hours. HbA1C: No results for input(s): HGBA1C in the last 72 hours. CBG: Recent Labs  Lab 08/27/21 0943 08/27/21 1133 08/27/21 1147  GLUCAP 57* 122* 115*   Lipid Profile: No results for input(s): CHOL, HDL, LDLCALC, TRIG, CHOLHDL, LDLDIRECT in the last 72 hours. Thyroid Function Tests: No results for input(s): TSH, T4TOTAL, FREET4, T3FREE, THYROIDAB in the last 72 hours. Anemia Panel: No results for input(s): VITAMINB12, FOLATE, FERRITIN, TIBC, IRON, RETICCTPCT in the last 72 hours. Urine analysis:    Component Value Date/Time   COLORURINE YELLOW 08/25/2021 0500   APPEARANCEUR CLEAR 08/25/2021 0500   LABSPEC <1.005 (L) 08/25/2021 0500   PHURINE 5.0 08/25/2021 0500   GLUCOSEU NEGATIVE 08/25/2021 0500   HGBUR SMALL (A) 08/25/2021 0500   BILIRUBINUR NEGATIVE 08/25/2021 0500   KETONESUR NEGATIVE 08/25/2021 0500   PROTEINUR NEGATIVE 08/25/2021 0500   NITRITE NEGATIVE 08/25/2021 0500   LEUKOCYTESUR NEGATIVE 08/25/2021 0500   Sepsis Labs: @LABRCNTIP (procalcitonin:4,lacticidven:4)  ) Recent Results (from the past 240 hour(s))  Resp Panel by RT-PCR (Flu A&B, Covid) Nasopharyngeal Swab     Status: None   Collection Time: 08/25/21  6:06 AM   Specimen: Nasopharyngeal Swab; Nasopharyngeal(NP) swabs in vial transport medium  Result Value Ref Range Status   SARS Coronavirus 2 by RT PCR NEGATIVE NEGATIVE Final    Comment: (NOTE) SARS-CoV-2 target nucleic acids are NOT DETECTED.  The SARS-CoV-2 RNA is generally detectable in upper respiratory specimens during the acute phase of infection. The lowest concentration of SARS-CoV-2 viral copies this assay can detect is 138 copies/mL. A negative  result does not preclude SARS-Cov-2 infection and should not be used as the sole basis for treatment or other patient management decisions. A negative result may occur with  improper specimen collection/handling, submission of specimen other than nasopharyngeal swab, presence of viral mutation(s) within the areas targeted by this assay, and inadequate number of viral copies(<138 copies/mL). A negative result must be combined with clinical observations, patient history, and epidemiological information. The expected result is Negative.  Fact Sheet for Patients:  EntrepreneurPulse.com.au  Fact Sheet for Healthcare Providers:  IncredibleEmployment.be  This test is no t yet approved or cleared by the [place] FDA and  has been authorized for detection and/or diagnosis of SARS-CoV-2 by FDA under an Emergency Use Authorization (EUA). This EUA will remain  in effect (meaning this test can be used) for the duration of the COVID-19 declaration under Section 564(b)(1) of the Act, 21 U.S.C.section 360bbb-3(b)(1), unless the authorization is terminated  or revoked sooner.       Influenza A by PCR NEGATIVE NEGATIVE Final   Influenza B by PCR NEGATIVE NEGATIVE Final    Comment: (NOTE) The Xpert  Xpress SARS-CoV-2/FLU/RSV plus assay is intended as an aid in the diagnosis of influenza from Nasopharyngeal swab specimens and should not be used as a sole basis for treatment. Nasal washings and aspirates are unacceptable for Xpert Xpress SARS-CoV-2/FLU/RSV testing.  Fact Sheet for Patients: EntrepreneurPulse.com.au  Fact Sheet for Healthcare Providers: IncredibleEmployment.be  This test is not yet approved or cleared by the [place] FDA and has been authorized for detection and/or diagnosis of SARS-CoV-2 by FDA under an Emergency Use Authorization (EUA). This EUA will remain in effect (meaning this test can be used)  for the duration of the COVID-19 declaration under Section 564(b)(1) of the Act, 21 U.S.C. section 360bbb-3(b)(1), unless the authorization is terminated or revoked.  Performed at [facility], [address] [place], [place] [address]          Radiology Studies: No results found.      Scheduled Meds:  Chlorhexidine Gluconate Cloth  6 each Topical Daily   sertraline  25 mg Oral Daily   Continuous Infusions:  dextrose 5 % and 0.9% NaCl 75 mL/hr at 08/27/21 1127   heparin Stopped (08/27/21 1045)   piperacillin-tazobactam (ZOSYN)  IV 3.375 g (08/27/21 1130)     LOS: 2 days        Ellie Lunch, student [facility] Pager   If 7PM-7AM, please contact night-coverage [email or web address] Password [facility] 08/27/2021, 12:11 PM

## 2021-08-27 NOTE — Consult Note (Signed)
Consultation Note Date: 08/27/2021   Patient Name: Wesley [name]  DOB: November 02, 1946  MRN: [id number]  Age / Sex: 75 y.o., male  PCP: Jearld Fenton, NP Referring Physician: Rodena Goldmann, DO  Reason for Consultation: Establishing goals of care and Psychosocial/spiritual support  HPI/Patient Profile: 75 y.o. male  with past medical history of rectal adenocarcinoma with prior chemoradiation, ongoing tobacco abuse, anxiety/depression, hearing loss admitted on 08/24/2021 with acute ischemic colitis.  Seen and cleared by psychiatry today.  Clinical Assessment and Goals of Care: I have reviewed medical records including EPIC notes, labs and imaging, received report from RN, assessed the patient.  Wesley [name] is sitting up in bed.  His sister, bedside nursing staff are present and telepsych is evaluating.  I share that I will return later.   I will return later in the afternoon.  Wesley [name] has returned from flex sigmoid.  He is sleeping soundly, sister, Wesley [name] is at bedside.  Wesley [name] and I meet to discuss diagnosis prognosis, GOC, EOL wishes, disposition and options.   I introduced Palliative Medicine as specialized medical care for people living with serious illness. It focuses on providing relief from the symptoms and stress of a serious illness. The goal is to improve quality of life for both the patient and the family.  We discussed a brief life review of the patient.  Wesley [name] shares that Vestal has been depressed most of his life.  He shares that they had a difficult childhood.  She shares that he lives next door to his daughter in a camper.  Wesley [name] tells me that she would like for Rance to return to her home after he has completed short-term rehab.  She shares that there is some family dynamics that may make this difficult.    We then focused on their current illness.  We talked about flex sig results,  sample sent for testing, anticipated permanent colostomy.  Wesley [name] shares that her uncle has had a colostomy for many years, and she understands that people can live a good quality with ostomy.  She asks if this will improve Wesley [name]'s pain.  I share that he will initially have postop pain but the goal is to reduce his overall pain.  We talked about awaiting tissue pathology results.  We talked about possible need for follow-up with oncology.  Wesley [name] shares that she has suspected for some time that [place] cancer has returned.  The natural disease trajectory and expectations at EOL were discussed.  Palliative Care services outpatient were explained and offered.  At this point, family is open to outpatient palliative services to continue goals of care discussions.  Lorene asks about short-term rehab.  She tells me that she feels that Pablo will need short-term rehab after surgery.  We briefly talked about benefits and how this works.  We also talked about social worker at short-term rehab assisting with at home needs.  Discussed the importance of continued conversation with family and the medical providers regarding overall plan of care and treatment options,  ensuring decisions are within the context of the patient's values and GOCs.  Questions and concerns were addressed.  The family was encouraged to call with questions or concerns.  PMT will continue to support holistically.   HCPOA   NEXT OF KIN    SUMMARY OF RECOMMENDATIONS   At this point continue to treat the treatable Agreeable to permanent ostomy, surgery scheduled for Wednesday Anticipate need for short-term rehab Agreeable to outpatient palliative services.   Code Status/Advance Care Planning: DNR  Symptom Management:  Per hospitalist, no additional needs at this time.   Palliative Prophylaxis:  Frequent Pain Assessment  Additional Recommendations (Limitations, Scope, Preferences): Continue to treat the treatable but no CPR  or intubation.  Psycho-social/Spiritual:  Desire for further Chaplaincy support:yes Additional Recommendations: Caregiving  Support/Resources and Education on Hospice  Prognosis:  Unable to determine, guarded at this time. Based on outcomes.   Discharge Planning:  Anticipate need for short-term rehab, agreeable to outpatient palliative.       Primary Diagnoses: Present on Admission:  Acute ischemic colitis ([place])   I have reviewed the medical record, interviewed the patient and family, and examined the patient. The following aspects are pertinent.  Past Medical History:  Diagnosis Date   Cancer ([place]) 10/21/2017   rectal cancer   Depression    GERD (gastroesophageal reflux disease)    HOH (hard of hearing)    Rectal adenocarcinoma ([place]) 11/07/2017   Stage III, radiation and chemo 2019   Wears dentures    full upper and lower   Wears hearing aid in both ears    Social History   Socioeconomic History   Marital status: Widowed    Spouse name: Not on file   Number of children: 3   Years of education: Not on file   Highest education level: Not on file  Occupational History   Occupation: truck driver  Tobacco Use   Smoking status: Every Day    Packs/day: 1.00    Years: 56.00    Pack years: 56.00    Types: Cigarettes   Smokeless tobacco: Never   Tobacco comments:    started smoking age 64  Vaping Use   Vaping Use: Never used  Substance and Sexual Activity   Alcohol use: No    Alcohol/week: 0.0 standard drinks   Drug use: No   Sexual activity: Not Currently  Other Topics Concern   Not on file  Social History Narrative   Not on file   Social Determinants of Health   Financial Resource Strain: Not on file  Food Insecurity: Not on file  Transportation Needs: Not on file  Physical Activity: Not on file  Stress: Not on file  Social Connections: Not on file   Family History  Problem Relation Age of Onset   Cancer Mother        Kidney   Diabetes Mother     Cancer Paternal Uncle        colon   Cancer Paternal Aunt 36       breast cancer   Hyperlipidemia Neg Hx    Hypertension Neg Hx    Stroke Neg Hx    Scheduled Meds:  [MAR Hold] Chlorhexidine Gluconate Cloth  6 each Topical Daily   [MAR Hold] sertraline  25 mg Oral Daily   Continuous Infusions:  sodium chloride     dextrose 5 % and 0.9% NaCl 75 mL/hr at 08/27/21 1127   heparin Stopped (08/27/21 1045)   lactated ringers 50 mL/hr at  08/27/21 1515   [MAR Hold] piperacillin-tazobactam (ZOSYN)  IV 3.375 g (08/27/21 1130)   PRN Meds:.[MAR Hold] acetaminophen **OR** [MAR Hold] acetaminophen, [MAR Hold] ALPRAZolam, [MAR Hold]  HYDROmorphone (DILAUDID) injection, [MAR Hold] ondansetron **OR** [MAR Hold] ondansetron (ZOFRAN) IV Medications Prior to Admission:  Prior to Admission medications   Medication Sig Start Date End Date Taking? Authorizing Provider  bismuth subsalicylate (PEPTO BISMOL) 262 MG/15ML suspension Take 30 mLs by mouth every 6 (six) hours as needed for diarrhea or loose stools.   Yes [provider]  Loperamide HCl (IMODIUM A-D PO) Take 1 tablet by mouth daily as needed (loose stools).   Yes [provider]  ALPRAZolam Duanne Moron) 0.5 MG tablet Take 1 tablet (0.5 mg total) by mouth at bedtime as needed for anxiety. Patient not taking: No sig reported 06/11/19   Truitt Merle, MD  potassium chloride SA (K-DUR) 20 MEQ tablet Take 1 tablet (20 mEq total) by mouth daily. Patient not taking: No sig reported 06/11/19   Truitt Merle, MD  zolpidem (AMBIEN) 5 MG tablet Take 1-2 tablets (5-10 mg total) by mouth at bedtime as needed for sleep. Patient not taking: Reported on 08/25/2021 01/31/21   Truitt Merle, MD   No Known Allergies Review of Systems  Unable to perform ROS: Other   Physical Exam Vitals and nursing note reviewed.  Constitutional:      General: He is not in acute distress.    Appearance: He is ill-appearing.  Neurological:     Mental Status: He is alert.    Vital  Signs: BP (!) 98/47   Pulse 75   Temp 98.5 F (36.9 C)   Resp 14   Ht 5\' 6"  (1.676 m)   Wt 60 kg   SpO2 96%   BMI 21.35 kg/m  Pain Scale: 0-10   Pain Score: 0-No pain   SpO2: SpO2: 96 % O2 Device:SpO2: 96 % O2 Flow Rate: .O2 Flow Rate (L/min): 2 L/min  IO: Intake/output summary:  Intake/Output Summary (Last 24 hours) at 08/27/2021 1604 Last data filed at 08/27/2021 1533 Gross per 24 hour  Intake 400 ml  Output 1400 ml  Net -1000 ml    LBM:   Baseline Weight: Weight: 60 kg Most recent weight: Weight: 60 kg     Palliative Assessment/Data:   Flowsheet Rows    Flowsheet Row Most Recent Value  Intake Tab   Referral Department Hospitalist  Unit at Time of Referral Med/Surg Unit  Palliative Care Primary Diagnosis Other (Comment)  Palliative Care Type New Palliative care  Reason for referral Clarify Goals of Care  Date of Admission 08/27/21  Date first seen by Palliative Care 08/27/21  Clinical Assessment   Palliative Performance Scale Score 40%  Pain Max last 24 hours Not able to report  Pain Min Last 24 hours Not able to report  Dyspnea Max Last 24 Hours Not able to report  Dyspnea Min Last 24 hours Not able to report  Psychosocial & Spiritual Assessment   Palliative Care Outcomes        Time In: 1500 Time Out: 1610 Time Total: 70 minutes  Greater than 50%  of this time was spent counseling and coordinating care related to the above assessment and plan.  Signed by: Drue Novel, NP   Please contact Palliative Medicine Team phone at [phone] for questions and concerns.  For individual provider: See Shea Evans

## 2021-08-27 NOTE — Progress Notes (Signed)
[facility] Progress Note     Subjective: Still with pain. Sister at bedside and says they just want surgery. Discussed with her and him that we cannot be sure this will get better or not recur. She says he wants surgery. He had psychiatry consult yesterday for SI and depression.   He says he just wants to be done and not suffer. He does not talk much.   Objective: Vital signs in last 24 hours: Temp:  [97.8 F (36.6 C)-98.2 F (36.8 C)] 98.2 F (36.8 C) (09/26 0547) Pulse Rate:  [67-77] 76 (09/26 0547) Resp:  [18-20] 18 (09/26 0547) BP: (93-113)/(53-57) 93/53 (09/26 0547) SpO2:  [91 %-94 %] 91 % (09/26 0547)    Intake/Output from previous day: 09/25 0701 - 09/26 0700 In: -  Out: 900 [Urine:900] Intake/Output this shift: No intake/output data recorded.  General appearance: alert and no distress GI: soft, tender LLQ, no rebound or guarding  Lab Results:  Recent Labs    08/26/21 0118 08/27/21 0602  WBC 12.4* 10.7*  HGB 12.9* 11.4*  HCT 38.3* 35.0*  PLT 177 170   BMET Recent Labs    08/26/21 0118 08/27/21 0602  NA 131* 133*  K 3.5 3.5  CL 102 106  CO2 22 16*  GLUCOSE 82 64*  BUN 12 10  CREATININE 0.79 0.81  CALCIUM 7.7* 7.7*   PT/INR No results for input(s): LABPROT, INR in the last 72 hours.  Studies/Results: No results found.  Anti-infectives: Anti-infectives (From admission, onward)    Start     Dose/Rate Route Frequency Ordered Stop   08/25/21 1200  piperacillin-tazobactam (ZOSYN) IVPB 3.375 g        3.375 g 12.5 mL/hr over 240 Minutes Intravenous Every 8 hours 08/25/21 0813     08/25/21 0430  piperacillin-tazobactam (ZOSYN) IVPB 3.375 g        3.375 g 100 mL/hr over 30 Minutes Intravenous  Once 08/25/21 0427 08/25/21 0604       Assessment/Plan: Patient with ischemic colitis subacute? Versus radiation colitis in setting of radiation for rectal cancer that had no surgery. Doing ok.  Discussed that he may ultimately require  colostomy but we need to see what the flex sig shows today before deciding anything, discussed that he does not seem to be eager to have the colostomy but was willing if it meant life or death Heparin gtt held for Flex sig If need to proceed with colostomy likely will be Wednesday pending no catastrophic findings on flex sig  Palliative also getting involved to discuss goals of care    LOS: 2 days    Virl Cagey 08/27/2021

## 2021-08-27 NOTE — Consult Note (Addendum)
Referring Provider: Dr. Manuella Ghazi  Primary Care Physician:  Jearld Fenton, NP Primary Gastroenterologist:  Dr. Benson Norway  Date of Admission: 08/24/21 Date of Consultation: 08/27/21  Reason for Consultation:  Consideration for flex sig in setting of possible ischemic colitis   HPI:  Wesley [name] is a 75 y.o. year old male with history of rectal adenocarcinoma Stage IIA, diagnosed in Nov 2018, undergoing chemo and radiation, declining surgical intervention, presenting this admission with acute lower abdominal pain with imaging via CT abd/pelvis with contrast noting short segment ischemic colitis involving the terminal sigmoid colon. 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 He was started on IV heparin drip. Surgery following. Consideration for flex sig was raised to further investigate findings on CT. Dr. Constance Haw spoke with IR, who believes IMA findings is likely chronic. GI consulted to consider flex sig to further sort out findings.  Per sister, patient is very hard of hearing. He does not read or write. He notes acute onset of pain, abdominal cramping, rectal bleeding starting on Friday. Lower abdominal pain improved since admission. No further rectal bleeding. Lower abdominal pain still present but improving. He has been NPO since admission.   He denies any chronic postprandial pain. However, he notes for many years he has postprandial loose stools. Poor appetite. "Keeps diarrhea". States this has been going on since age 70. Worsened after radiation. Doesn't want to eat due to looser stool. Weight loss noted over the past year, possibly 25-30lbs. Recommended to take Imodium by Oncology but still had diarrhea postprandially.   He has noted low-volume hematochezia over the years but "nothing like this admission". When seen by Oncology in Aug 2022, he had reported intermittent low-volume hematochezia, abdominal bloating. No definite evidence of residual cancer or metastatic disease. 5 mm lesion  on CT in August was indeterminate. Oncology recommended colonoscopy on last several visit but was not pursued by patient.      Past Medical History:  Diagnosis Date   Cancer ([place]) 10/21/2017   rectal cancer   Depression    HOH (hard of hearing)    Rectal adenocarcinoma ([place]) 11/07/2017   Stage III, radiation and chemo 2019   Wears dentures    full upper and lower   Wears hearing aid in both ears     Past Surgical History:  Procedure Laterality Date   CATARACT EXTRACTION W/PHACO Left 05/17/2020   Procedure: CATARACT EXTRACTION PHACO AND INTRAOCULAR LENS PLACEMENT (IOC) LEFT 8.43  00:57.1  14.7%;  Surgeon: Leandrew Koyanagi, MD;  Location: [facility];  Service: Ophthalmology;  Laterality: Left;   CATARACT EXTRACTION W/PHACO Right 07/19/2020   Procedure: CATARACT EXTRACTION PHACO AND INTRAOCULAR LENS PLACEMENT ([place]) RIGHT MALYUGIN;  Surgeon: Leandrew Koyanagi, MD;  Location: [facility];  Service: Ophthalmology;  Laterality: Right;  12.59, 01:23.4, 15.1%   CHOLECYSTECTOMY     EUS N/A 10/31/2017   Dr. Benson Norway: hypoechoic mass in rectum, partiall circumferential, tattoeed.    Prior to Admission medications   Medication Sig Start Date End Date Taking? Authorizing Provider  bismuth subsalicylate (PEPTO BISMOL) 262 MG/15ML suspension Take 30 mLs by mouth every 6 (six) hours as needed for diarrhea or loose stools.   Yes [provider]  Loperamide HCl (IMODIUM A-D PO) Take 1 tablet by mouth daily as needed (loose stools).   Yes [provider]  ALPRAZolam Duanne Moron) 0.5 MG tablet Take 1 tablet (0.5 mg total) by mouth at bedtime as needed for anxiety. Patient not taking: No sig reported 06/11/19  Truitt Merle, MD  potassium chloride SA (K-DUR) 20 MEQ tablet Take 1 tablet (20 mEq total) by mouth daily. Patient not taking: No sig reported 06/11/19   Truitt Merle, MD  zolpidem (AMBIEN) 5 MG tablet Take 1-2 tablets (5-10 mg total) by mouth at bedtime as needed for  sleep. Patient not taking: Reported on 08/25/2021 01/31/21   Truitt Merle, MD    Current Facility-Administered Medications  Medication Dose Route Frequency Provider Last Rate Last Admin   0.9 %  sodium chloride infusion   Intravenous Continuous Heath Lark D, DO 75 mL/hr at 08/26/21 1213 New Bag at 08/26/21 1213   acetaminophen (TYLENOL) tablet 650 mg  650 mg Oral Q6H PRN Heath Lark D, DO       Or   acetaminophen (TYLENOL) suppository 650 mg  650 mg Rectal Q6H PRN Manuella Ghazi, Pratik D, DO       ALPRAZolam Duanne Moron) tablet 0.5 mg  0.5 mg Oral QHS PRN Manuella Ghazi, Pratik D, DO   0.5 mg at 08/26/21 0510   Chlorhexidine Gluconate Cloth 2 % PADS 6 each  6 each Topical Daily Heath Lark D, DO   6 each at 08/27/21 1696   heparin ADULT infusion 100 units/mL (25000 units/254mL)  1,100 Units/hr Intravenous Continuous Heath Lark D, DO 11 mL/hr at 08/25/21 1809 1,100 Units/hr at 08/25/21 1809   HYDROmorphone (DILAUDID) injection 0.5-1 mg  0.5-1 mg Intravenous Q2H PRN Heath Lark D, DO   1 mg at 08/27/21 7893   ondansetron (ZOFRAN) tablet 4 mg  4 mg Oral Q6H PRN Heath Lark D, DO       Or   ondansetron [facility]) injection 4 mg  4 mg Intravenous Q6H PRN Manuella Ghazi, Pratik D, DO   4 mg at 08/26/21 8101   piperacillin-tazobactam (ZOSYN) IVPB 3.375 g  3.375 g Intravenous Q8H Shah, Pratik D, DO 12.5 mL/hr at 08/27/21 0439 3.375 g at 08/27/21 0439   sertraline (ZOLOFT) tablet 25 mg  25 mg Oral Daily Leevy-Johnson, Brooke A, NP   25 mg at 08/27/21 7510    Allergies as of 08/24/2021   (No Known Allergies)    Family History  Problem Relation Age of Onset   Cancer Mother        Kidney   Diabetes Mother    Cancer Paternal Uncle        colon   Cancer Paternal Aunt 82       breast cancer   Hyperlipidemia Neg Hx    Hypertension Neg Hx    Stroke Neg Hx     Social History   Socioeconomic History   Marital status: Widowed    Spouse name: Not on file   Number of children: 3   Years of education: Not on file   Highest  education level: Not on file  Occupational History   Occupation: truck driver  Tobacco Use   Smoking status: Every Day    Packs/day: 1.00    Years: 56.00    Pack years: 56.00    Types: Cigarettes   Smokeless tobacco: Never   Tobacco comments:    started smoking age 41  Vaping Use   Vaping Use: Never used  Substance and Sexual Activity   Alcohol use: No    Alcohol/week: 0.0 standard drinks   Drug use: No   Sexual activity: Not Currently  Other Topics Concern   Not on file  Social History Narrative   Not on file   Social Determinants of Radio broadcast assistant  Strain: Not on file  Food Insecurity: Not on file  Transportation Needs: Not on file  Physical Activity: Not on file  Stress: Not on file  Social Connections: Not on file  Intimate Partner Violence: Not on file    Review of Systems: Gen: see HPI CV: Denies chest pain, heart palpitations, syncope, edema  Resp: Denies shortness of breath with rest, cough, wheezing GI: see HPI GU : Denies urinary burning, urinary frequency, urinary incontinence.  MS: Denies joint pain,swelling, cramping Derm: Denies rash, itching, dry skin Psych: Denies depression, anxiety,confusion, or memory loss Heme: see HPI  Physical Exam: Vital signs in last 24 hours: Temp:  [97.8 F (36.6 C)-98.2 F (36.8 C)] 98.2 F (36.8 C) (09/26 0547) Pulse Rate:  [67-77] 76 (09/26 0547) Resp:  [18-20] 18 (09/26 0547) BP: (93-113)/(53-57) 93/53 (09/26 0547) SpO2:  [91 %-94 %] 91 % (09/26 0547)   General:   Alert,  chronically ill-appearing, flat affect Head:  Normocephalic and atraumatic. Eyes:  Sclera clear, no icterus.   Conjunctiva pink. Ears:  hard of hearing Lungs:  Clear throughout to auscultation.    Heart:  S1 S2 present without murmurs Abdomen:  Soft, mild TTP lower abdomen, suprapubic, and nondistended. No masses, hepatosplenomegaly or hernias noted. Normal bowel sounds, without guarding, and without rebound.   Rectal:   Deferred until flex sig Extremities:  Without edema. Neurologic:  Alert and  oriented x4 Psych:  Alert and cooperative. Flat affect  Intake/Output from previous day: 09/25 0701 - 09/26 0700 In: -  Out: 900 [Urine:900] Intake/Output this shift: No intake/output data recorded.  Lab Results: Recent Labs    08/25/21 0100 08/26/21 0118 08/27/21 0602  WBC 15.8* 12.4* 10.7*  HGB 15.1 12.9* 11.4*  HCT 44.3 38.3* 35.0*  PLT 198 177 170   BMET Recent Labs    08/25/21 0100 08/26/21 0118 08/27/21 0602  NA 135 131* 133*  K 3.6 3.5 3.5  CL 101 102 106  CO2 26 22 16*  GLUCOSE 109* 82 64*  BUN 11 12 10   CREATININE 0.77 0.79 0.81  CALCIUM 9.0 7.7* 7.7*   LFT Recent Labs    08/25/21 0100 08/26/21 0118 08/27/21 0602  PROT 7.2 6.1* 5.3*  ALBUMIN 3.8 3.0* 2.5*  AST 19 18 18   ALT 13 12 12   ALKPHOS 63 55 3  BILITOT 1.1 1.1 1.1     Impression:  75 y.o. year old male with history of rectal adenocarcinoma Stage IIA, diagnosed in Nov 2018 and undergoing chemo and radiation but declined surgical intervention, presenting this admission with acute lower abdominal pain with imaging via CT abd/pelvis with contrast noting short segment ischemic colitis involving the terminal sigmoid colon. 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 He was started on IV heparin drip. Surgery following. Consideration for flex sig was raised to further investigate findings on CT. Dr. Constance Haw spoke with IR, who believes IMA findings are likely chronic. GI consulted to consider flex sig to further sort out findings.  Clinically, presentation was consistent with an ischemic etiology. He does note chronic low-volume hematochezia and postprandial urgency/looser stool that has worsened since radiation. However, he also notes chronic postprandial loose stool dating back many years. Poor appetite and associated weight loss noted but denies outright postprandial abdominal pain. He doesn't outright report food  aversion but does not like to eat due to postprandial symptom of diarrhea. Query underlying chronic ischemic etiology.   CT imaging with 5 cm length segment of ischemic bowel involving sigmoid  colon with differentials including arterial embolism and radiation colitis. Flex sig will be pursued to further investigate.  Surgery is on board and following closely with subsequent plan to follow pending flex sig.   As of note, sister states he is hard of hearing and does not read or write.    Plan: Remain NPO 2. Flex sig with Propofol today. Discussed risks and benefits with patient and sister, who both state understanding.  3. Heparin drip has been paused (approximately 1030) and will be stopped officially per nursing staff: I and Dr. Constance Haw communicated this to nursing staff. 4. Tap water enema 5. Further recommendations to follow   Annitta Needs, PhD, ANP-BC [facility]     LOS: 2 days    08/27/2021, 9:34 AM

## 2021-08-27 NOTE — Anesthesia Preprocedure Evaluation (Addendum)
Anesthesia Evaluation  Patient identified by MRN, date of birth, ID band Patient awake    Reviewed: Allergy & Precautions, NPO status , Patient's Chart, lab work & pertinent test results  Airway Mallampati: II  TM Distance: >3 FB Neck ROM: Full    Dental  (+) Dental Advisory Given, Missing   Pulmonary neg pulmonary ROS, Current Smoker and Patient abstained from smoking.,    Pulmonary exam normal breath sounds clear to auscultation       Cardiovascular Exercise Tolerance: Poor Normal cardiovascular exam Rhythm:Regular Rate:Normal  25-Aug-2021 00:48:56 [facility] ROUTINE RECORD [id number] (77 yr) Male Caucasian Vent. rate 73 BPM PR interval 164 ms QRS duration 92 ms QT/QTcB 390/430 ms P-R-T axes 81 39 70 Sinus rhythm Abnormal R-wave progression, early transition ST elevation, consider inferior injury   Neuro/Psych PSYCHIATRIC DISORDERS Depression negative neurological ROS     GI/Hepatic Neg liver ROS, GERD  Medicated,Rectal cancer Ischemic colitis    Endo/Other  negative endocrine ROS  Renal/GU negative Renal ROS  negative genitourinary   Musculoskeletal negative musculoskeletal ROS (+)   Abdominal   Peds negative pediatric ROS (+)  Hematology  (+) anemia ,   Anesthesia Other Findings   Reproductive/Obstetrics negative OB ROS                           Anesthesia Physical Anesthesia Plan  ASA: 4  Anesthesia Plan: General   Post-op Pain Management:    Induction: Intravenous  PONV Risk Score and Plan: TIVA  Airway Management Planned: Nasal Cannula and Natural Airway  Additional Equipment:   Intra-op Plan:   Post-operative Plan: Possible Post-op intubation/ventilation  Informed Consent: I have reviewed the patients History and Physical, chart, labs and discussed the procedure including the risks, benefits and alternatives for the proposed anesthesia  with the patient or authorized representative who has indicated his/her understanding and acceptance.    Discussed DNR with patient and Continue DNR.     Plan Discussed with: CRNA and Surgeon  Anesthesia Plan Comments: (Agreed to get intubation and vent if vomit and aspirate during the case.)      Anesthesia Quick Evaluation

## 2021-08-28 ENCOUNTER — Encounter (HOSPITAL_COMMUNITY): Payer: Self-pay | Admitting: Internal Medicine

## 2021-08-28 DIAGNOSIS — K55039 Acute (reversible) ischemia of large intestine, extent unspecified: Secondary | ICD-10-CM | POA: Diagnosis not present

## 2021-08-28 DIAGNOSIS — Z7189 Other specified counseling: Secondary | ICD-10-CM | POA: Diagnosis not present

## 2021-08-28 DIAGNOSIS — F0631 Mood disorder due to known physiological condition with depressive features: Secondary | ICD-10-CM | POA: Diagnosis not present

## 2021-08-28 DIAGNOSIS — Z515 Encounter for palliative care: Secondary | ICD-10-CM | POA: Diagnosis not present

## 2021-08-28 LAB — HEPARIN LEVEL (UNFRACTIONATED)
Heparin Unfractionated: 0.19 IU/mL — ABNORMAL LOW (ref 0.30–0.70)
Heparin Unfractionated: 0.35 IU/mL (ref 0.30–0.70)

## 2021-08-28 LAB — CBC
HCT: 33.2 % — ABNORMAL LOW (ref 39.0–52.0)
Hemoglobin: 11.5 g/dL — ABNORMAL LOW (ref 13.0–17.0)
MCH: 30.6 pg (ref 26.0–34.0)
MCHC: 34.6 g/dL (ref 30.0–36.0)
MCV: 88.3 fL (ref 80.0–100.0)
Platelets: 185 10*3/uL (ref 150–400)
RBC: 3.76 MIL/uL — ABNORMAL LOW (ref 4.22–5.81)
RDW: 13.2 % (ref 11.5–15.5)
WBC: 7.8 10*3/uL (ref 4.0–10.5)
nRBC: 0 % (ref 0.0–0.2)

## 2021-08-28 LAB — ABO/RH: ABO/RH(D): A POS

## 2021-08-28 LAB — BASIC METABOLIC PANEL
Anion gap: 6 (ref 5–15)
BUN: 5 mg/dL — ABNORMAL LOW (ref 8–23)
CO2: 23 mmol/L (ref 22–32)
Calcium: 7.8 mg/dL — ABNORMAL LOW (ref 8.9–10.3)
Chloride: 105 mmol/L (ref 98–111)
Creatinine, Ser: 0.7 mg/dL (ref 0.61–1.24)
GFR, Estimated: >60 mL/min (ref >60)
Glucose, Bld: 110 mg/dL — ABNORMAL HIGH (ref 70–99)
Potassium: 3.3 mmol/L — ABNORMAL LOW (ref 3.5–5.1)
Sodium: 134 mmol/L — ABNORMAL LOW (ref 135–145)

## 2021-08-28 LAB — GLUCOSE, CAPILLARY
Glucose-Capillary: 115 mg/dL — ABNORMAL HIGH (ref 70–99)
Glucose-Capillary: 117 mg/dL — ABNORMAL HIGH (ref 70–99)
Glucose-Capillary: 122 mg/dL — ABNORMAL HIGH (ref 70–99)
Glucose-Capillary: 132 mg/dL — ABNORMAL HIGH (ref 70–99)

## 2021-08-28 LAB — TYPE AND SCREEN
ABO/RH(D): A POS
Antibody Screen: NEGATIVE

## 2021-08-28 LAB — SURGICAL PCR SCREEN
MRSA, PCR: NEGATIVE
Staphylococcus aureus: NEGATIVE

## 2021-08-28 LAB — MAGNESIUM: Magnesium: 1.7 mg/dL (ref 1.7–2.4)

## 2021-08-28 MED ORDER — HEPARIN BOLUS VIA INFUSION
1000.0000 [IU] | Freq: Once | INTRAVENOUS | Status: AC
  Administered 2021-08-28: 1000 [IU] via INTRAVENOUS
  Filled 2021-08-28: qty 1000

## 2021-08-28 MED ORDER — SODIUM CHLORIDE 0.9 % IV SOLN
2.0000 g | INTRAVENOUS | Status: AC
  Administered 2021-08-29: 2 g via INTRAVENOUS
  Filled 2021-08-28: qty 2

## 2021-08-28 MED ORDER — POTASSIUM CHLORIDE CRYS ER 20 MEQ PO TBCR
40.0000 meq | EXTENDED_RELEASE_TABLET | Freq: Two times a day (BID) | ORAL | Status: AC
  Administered 2021-08-28 (×2): 40 meq via ORAL
  Filled 2021-08-28 (×2): qty 2

## 2021-08-28 MED ORDER — CHLORHEXIDINE GLUCONATE CLOTH 2 % EX PADS
6.0000 | MEDICATED_PAD | Freq: Once | CUTANEOUS | Status: AC
  Administered 2021-08-28: 6 via TOPICAL

## 2021-08-28 NOTE — Progress Notes (Signed)
Palliative:  [name], [name], is resting quietly in bed eating jello.  He greets me, briefly making but not keeping eye contact.  He appears chronically ill and frail. He is alert and oriented, able to make his basic needs known,  His sister, Wesley [name], is at bedside.    We talked about Wesley [name]'s upcoming surgery for permanent colostomy tomorrow.  Wesley [name] shares that he is ready for surgery, and is hopeful for pain relief.  I share that he may experience several days of pain, but in the long run this should alleviate some of his pain and symptoms.  We talked about short-term rehab after discharge for wound care and ostomy training, strength and balance.  I encouraged Wesley [name] to stay a few weeks with his Wesley [name] for further support.  We talked about outpatient follow-ups including oncology and also palliative services for support.  Conference with attending, bedside nursing staff, transition of care team related to patient condition, needs, goals of care, disposition.  Plan: Permanent colostomy scheduled for 9/28.  Anticipate need for short-term rehab.  Outpatient palliative services to follow.  5 minutes  Wesley Axe, NP Palliative Medicine team  Team Phone [phone] Greater than 50% of this time was spent counseling and coordinating care related to the above assessment and plan.

## 2021-08-28 NOTE — Progress Notes (Signed)
ANTICOAGULATION CONSULT NOTE -  Pharmacy Consult for heparin Indication: possible meseternic stenosis, embolism, ischemic colitis  No Known Allergies  Patient Measurements: Height: 5\' 6"  (167.6 cm) Weight: 60 kg (132 lb 4.4 oz) IBW/kg (Calculated) : 63.8 Heparin Dosing Weight: 60 kg  Vital Signs: Temp: 98.6 F (37 C) (09/27 0451) Temp Source: Oral (09/27 0451) BP: 114/67 (09/27 0451) Pulse Rate: 72 (09/27 0451)  Labs: Recent Labs    08/26/21 0118 08/27/21 0602 08/28/21 0601  HGB 12.9* 11.4* 11.5*  HCT 38.3* 35.0* 33.2*  PLT 177 170 185  HEPARINUNFRC 0.55 0.33 0.19*  CREATININE 0.79 0.81 0.70     Estimated Creatinine Clearance: 67.7 mL/min (by C-G formula based on SCr of 0.7 mg/dL).   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   08/24/2021   Loperamide HCl (IMODIUM A-D PO) Take 1 tablet by mouth daily as needed (loose stools).   08/24/2021   ALPRAZolam (XANAX) 0.5 MG tablet Take 1 tablet (0.5 mg total) by mouth at bedtime as needed for anxiety. (Patient not taking: No sig reported) 30 tablet 1 Not Taking   potassium chloride SA (K-DUR) 20 MEQ tablet Take 1 tablet (20 mEq total) by mouth daily. (Patient not taking: No sig reported) 14 tablet 0 Not Taking   zolpidem (AMBIEN) 5 MG tablet Take 1-2 tablets (5-10 mg total) by mouth at bedtime as needed for sleep. (Patient not taking: Reported on 08/25/2021) 15 tablet 0 Not Taking    Assessment: Pharmacy  consulted to dose heparin in patient with possible mesenteric stenosis/embolism. Patient is not on anticoagulation prior to admission.  CBC WNL  HL 0.19- subtherapeutic. Confirmed with RN no issues  Goal of Therapy:  Heparin level 0.3-0.7 units/ml Monitor platelets by anticoagulation protocol: Yes   Plan:  Rebolus heparin 1000 units x 1 Increase heparin infusion to 1250 units/hr Check anti-Xa level in 8 hours and daily. Continue to monitor H&H and platelets.  Margot Ables, PharmD Clinical Pharmacist 08/28/2021 7:36 AM

## 2021-08-28 NOTE — Consult Note (Signed)
[facility] Nurse requested for preoperative stoma site marking  Discussed surgical procedure and stoma creation with patient.  Explained role of the [facility] nurse team.  Provided the patient with educational booklet and provided samples of pouching options.   Patient lives in [place] next to his daughter, limited support per patient.  However it is noted that a sister has been present in his meetings with palliative care.  I have requested that she be present for teaching session Thursday am.  Left my contact information for the same.   Examined patient lying, sitting, and standing in order to place the marking in the patient's visual field, away from any creases or abdominal contour issues and within the rectus muscle.  Attempted to mark below the patient's belt line.   Marked for colostomy in the LLQ  __6__ cm to the left of the umbilicus and __8__MQ above the umbilicus.  Patient's abdomen cleansed with CHG wipes at site markings, allowed to air dry prior to marking.Covered mark with thin film transparent dressing to preserve mark until date of surgery.   [facility] Nurse team will follow up with patient after surgery for continue ostomy care and teaching.  [place] MSN, [place], [place], [place]

## 2021-08-28 NOTE — Progress Notes (Signed)
[facility] Progress Note  1 Day Post-Op  Subjective: Still with pain, ischemic changes on colonoscopy and radiation changes. Overall doing ok and doing some clears today.   Objective: Vital signs in last 24 hours: Temp:  [98.1 F (36.7 C)-98.8 F (37.1 C)] 98.6 F (37 C) (09/27 0451) Pulse Rate:  [72-83] 72 (09/27 0451) Resp:  [10-18] 16 (09/27 0451) BP: (84-124)/(45-67) 114/67 (09/27 0451) SpO2:  [93 %-96 %] 95 % (09/27 0451) Weight:  [60 kg] 60 kg (09/26 1504) Last BM Date: 08/27/21  Intake/Output from previous day: 09/26 0701 - 09/27 0700 In: 1769.2 [P.O.:360; I.V.:1077.8; IV Piggyback:331.4] Out: 2250 [Urine:2250] Intake/Output this shift: Total I/O In: 236 [P.O.:236] Out: 700 [Urine:700]  General appearance: alert and no distress GI: soft, tender LLQ  Lab Results:  Recent Labs    08/27/21 0602 08/28/21 0601  WBC 10.7* 7.8  HGB 11.4* 11.5*  HCT 35.0* 33.2*  PLT 170 185   BMET Recent Labs    08/27/21 0602 08/28/21 0601  NA 133* 134*  K 3.5 3.3*  CL 106 105  CO2 16* 23  GLUCOSE 64* 110*  BUN 10 5*  CREATININE 0.81 0.70  CALCIUM 7.7* 7.8*   PT/INR No results for input(s): LABPROT, INR in the last 72 hours.  Studies/Results: No results found.  Anti-infectives: Anti-infectives (From admission, onward)    Start     Dose/Rate Route Frequency Ordered Stop   08/29/21 0600  cefoTEtan (CEFOTAN) 2 g in sodium chloride 0.9 % 100 mL IVPB        2 g 200 mL/hr over 30 Minutes Intravenous On call to O.R. 08/28/21 1306 08/30/21 0559   08/25/21 1200  piperacillin-tazobactam (ZOSYN) IVPB 3.375 g        3.375 g 12.5 mL/hr over 240 Minutes Intravenous Every 8 hours 08/25/21 0813     08/25/21 0430  piperacillin-tazobactam (ZOSYN) IVPB 3.375 g        3.375 g 100 mL/hr over 30 Minutes Intravenous  Once 08/25/21 0427 08/25/21 0604       Assessment/Plan: Mr. 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 Having pain and continued ischemic changes. No real improvement in symptoms in the last few days. Will proceed with colectomy and permanent end colostomy. Discussed with him and sister the risk of bleeding, infection, issues with colostomy, finding worsening cancer.  Discussed potential need for blood products. 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

## 2021-08-28 NOTE — Progress Notes (Signed)
PROGRESS NOTE    Wesley [name]  LZJ:[id number] DOB: 07/07/1946 DOA: 08/24/2021 PCP: Jearld Fenton, NP     Brief Narrative: Wesley [name], 81 yom, presented 08/24/21 with abdominal pain and bloody diarrhea. Hx of rectal adenocarcinoma, depression, gerd, tobacco use, hyperlipidemia. He reported lower abdominal pain with a bowel movement, reports blood in the stool, dark red in color. CT abd pelvis w contrast showed concern for IMA stenosis and an ischemic portion of sigmoid colon. Flexible sigmoidoscopy on 08/27/21 also shared impression of sigmoid colon ischemia. Patient had been resistant previously in discussions of colostomy, but is now open to 'anything to make this pain go away'.   Assessment & Plan:   Principal Problem:   Acute abdominal pain due to ischemic colitis -flexible sigmoidoscopy and ct both suggest ischemia of the sigmoid colon -heparin drip currently to prevent occlusion of already stenosed IMA -pain control, hydromorphone 0.5-1mg  q2hrs prn -iv zosyn in a pre-emptive manner, in the event of perforation of the ischemic bowel -diet is currently liquids -general surgery discussing procto-colectomy  Active problems:  Anxiety/depression -zoloft 25mg  was started 09/25 by psychiatry -plausible history of persistent, untreated depression, exacerbated by the loss of his wife and now acutely more so by his current health -continue alprazolam  0.5mg  qhs prn   History of rectal adenocarcinoma -currently under surveillance, prior history of radiation -GI plans for procto-colectomy  Hematochezia -pt reported dark red stool upon initial presentation -hemoglobin 9/23 15.1, current 9/27 11.5 -continue to monitor for further decrease  Leukocytosis -initial 9/23 wbc 15.8, current 9/27 wbc 7.8    DVT prophylaxis: heparin drip, 1250 units/hr Code Status:  dnr Family Communication: sister Disposition Plan:    Consultants:  General  surgery Gastroenterology Palliative Care Psychiatry  Procedures:  08/27/2021  Flexible Sigmoidoscopy for hematochezia and abnormal ct of GI tract Flexible Sigmoidoscopy to 20 cm.; rectal mucosa - distal 10 cm appeared friable with prominent neovascular changes; rectal mucosa to distal sigmoid (10-20 cm from the anal verge) appeared exuberantly inflamed with deep circumferential ulceration with underlying dusky appearing mucosa. 4 mm stricture at 20 cm from anal verge precluded further advancement of the scope. Biopsies taken. I suspect rectosigmoid findings are result of both radiation and ischemia effect. There is no good medical therapy for this finding.  Antimicrobials: zosyn 3.375g IV q8hrs   Subjective: Remains distressed by abd pain, does not wish to eat or drink to avoid provoking postprandial pain, liquid diet explained, agreeable to it. He reports continued pain, more understanding of his condition  Objective: Vitals:   08/27/21 2053 08/27/21 2100 08/27/21 2300 08/28/21 0451  BP: (!) 84/56 (!) 94/52 (!) 100/56 114/67  Pulse: 72   72  Resp: 16   16  Temp: 98.8 F (37.1 C)   98.6 F (37 C)  TempSrc: Oral   Oral  SpO2: 93%   95%  Weight:      Height:        Intake/Output Summary (Last 24 hours) at 08/28/2021 1027 Last data filed at 08/28/2021 0849 Gross per 24 hour  Intake 2005.2 ml  Output 2950 ml  Net -944.8 ml   Filed Weights   08/24/21 2043 08/27/21 1504  Weight: 60 kg 60 kg    Examination:  General exam: patient seated in chair, hard of hearing, uncomfortable due to persistent abdominal pain, pink, warm, dry to the touch.  Respiratory system: Clear to auscultation. Respiratory effort normal.  Cardiovascular system: S1 & S2 heard, RRR. No JVD, murmurs, rubs,  gallops or clicks. No pedal edema.  Gastrointestinal system: bowel sounds present, abd soft, lower abd tender to palpation  Central nervous system: Alert and oriented. No focal neurological  deficits.  Extremities: moves all extremities appropriately, no pedal edema  Skin: No rashes, lesions or ulcers  Psychiatry: he is distressed by his current condition, is willing to talk about it, is receptive to information, flat affect    Data Reviewed: I have personally reviewed following labs and imaging studies  CBC: Recent Labs  Lab 08/25/21 0100 08/26/21 0118 08/27/21 0602 08/28/21 0601  WBC 15.8* 12.4* 10.7* 7.8  HGB 15.1 12.9* 11.4* 11.5*  HCT 44.3 38.3* 35.0* 33.2*  MCV 91.5 92.1 92.1 88.3  PLT 198 177 170 782   Basic Metabolic Panel: Recent Labs  Lab 08/25/21 0100 08/26/21 0118 08/27/21 0602 08/28/21 0601  NA 135 131* 133* 134*  K 3.6 3.5 3.5 3.3*  CL 101 102 106 105  CO2 26 22 16* 23  GLUCOSE 109* 82 64* 110*  BUN 11 12 10  5*  CREATININE 0.77 0.79 0.81 0.70  CALCIUM 9.0 7.7* 7.7* 7.8*  MG  --  1.8 1.8 1.7   GFR: Estimated Creatinine Clearance: 67.7 mL/min (by C-G formula based on SCr of 0.7 mg/dL). Liver Function Tests: Recent Labs  Lab 08/25/21 0100 08/26/21 0118 08/27/21 0602  AST 19 18 18   ALT 13 12 12   ALKPHOS 63 55 54  BILITOT 1.1 1.1 1.1  PROT 7.2 6.1* 5.3*  ALBUMIN 3.8 3.0* 2.5*   Recent Labs  Lab 08/25/21 0100  LIPASE 21   No results for input(s): AMMONIA in the last 168 hours. Coagulation Profile: No results for input(s): INR, PROTIME in the last 168 hours. Cardiac Enzymes: No results for input(s): CKTOTAL, CKMB, CKMBINDEX, TROPONINI in the last 168 hours. BNP (last 3 results) No results for input(s): PROBNP in the last 8760 hours. HbA1C: No results for input(s): HGBA1C in the last 72 hours. CBG: Recent Labs  Lab 08/27/21 1147 08/27/21 1502 08/27/21 1718 08/28/21 0027 08/28/21 0539  GLUCAP 115* 99 89 117* 115*   Lipid Profile: No results for input(s): CHOL, HDL, LDLCALC, TRIG, CHOLHDL, LDLDIRECT in the last 72 hours. Thyroid Function Tests: No results for input(s): TSH, T4TOTAL, FREET4, T3FREE, THYROIDAB in the  last 72 hours. Anemia Panel: No results for input(s): VITAMINB12, FOLATE, FERRITIN, TIBC, IRON, RETICCTPCT in the last 72 hours. Urine analysis:    Component Value Date/Time   COLORURINE YELLOW 08/25/2021 0500   APPEARANCEUR CLEAR 08/25/2021 0500   LABSPEC <1.005 (L) 08/25/2021 0500   PHURINE 5.0 08/25/2021 0500   GLUCOSEU NEGATIVE 08/25/2021 0500   HGBUR SMALL (A) 08/25/2021 0500   BILIRUBINUR NEGATIVE 08/25/2021 0500   KETONESUR NEGATIVE 08/25/2021 0500   PROTEINUR NEGATIVE 08/25/2021 0500   NITRITE NEGATIVE 08/25/2021 0500   LEUKOCYTESUR NEGATIVE 08/25/2021 0500   Sepsis Labs: @LABRCNTIP (procalcitonin:4,lacticidven:4)  ) Recent Results (from the past 240 hour(s))  Resp Panel by RT-PCR (Flu A&B, Covid) Nasopharyngeal Swab     Status: None   Collection Time: 08/25/21  6:06 AM   Specimen: Nasopharyngeal Swab; Nasopharyngeal(NP) swabs in vial transport medium  Result Value Ref Range Status   SARS Coronavirus 2 by RT PCR NEGATIVE NEGATIVE Final    Comment: (NOTE) SARS-CoV-2 target nucleic acids are NOT DETECTED.  The SARS-CoV-2 RNA is generally detectable in upper respiratory specimens during the acute phase of infection. The lowest concentration of SARS-CoV-2 viral copies this assay can detect is 138 copies/mL. A negative result does  not preclude SARS-Cov-2 infection and should not be used as the sole basis for treatment or other patient management decisions. A negative result may occur with  improper specimen collection/handling, submission of specimen other than nasopharyngeal swab, presence of viral mutation(s) within the areas targeted by this assay, and inadequate number of viral copies(<138 copies/mL). A negative result must be combined with clinical observations, patient history, and epidemiological information. The expected result is Negative.  Fact Sheet for Patients:  EntrepreneurPulse.com.au  Fact Sheet for Healthcare Providers:   IncredibleEmployment.be  This test is no t yet approved or cleared by the [place] FDA and  has been authorized for detection and/or diagnosis of SARS-CoV-2 by FDA under an Emergency Use Authorization (EUA). This EUA will remain  in effect (meaning this test can be used) for the duration of the COVID-19 declaration under Section 564(b)(1) of the Act, 21 U.S.C.section 360bbb-3(b)(1), unless the authorization is terminated  or revoked sooner.       Influenza A by PCR NEGATIVE NEGATIVE Final   Influenza B by PCR NEGATIVE NEGATIVE Final    Comment: (NOTE) The Xpert Xpress SARS-CoV-2/FLU/RSV plus assay is intended as an aid in the diagnosis of influenza from Nasopharyngeal swab specimens and should not be used as a sole basis for treatment. Nasal washings and aspirates are unacceptable for Xpert Xpress SARS-CoV-2/FLU/RSV testing.  Fact Sheet for Patients: EntrepreneurPulse.com.au  Fact Sheet for Healthcare Providers: IncredibleEmployment.be  This test is not yet approved or cleared by the [place] FDA and has been authorized for detection and/or diagnosis of SARS-CoV-2 by FDA under an Emergency Use Authorization (EUA). This EUA will remain in effect (meaning this test can be used) for the duration of the COVID-19 declaration under Section 564(b)(1) of the Act, 21 U.S.C. section 360bbb-3(b)(1), unless the authorization is terminated or revoked.  Performed at [facility], [address] [place], [place] [address]          Radiology Studies: No results found.      Scheduled Meds:  Chlorhexidine Gluconate Cloth  6 each Topical Daily   potassium chloride  40 mEq Oral BID   sertraline  25 mg Oral Daily   Continuous Infusions:  dextrose 5 % and 0.9% NaCl 75 mL/hr at 08/28/21 0304   heparin 1,250 Units/hr (08/28/21 0744)   piperacillin-tazobactam (ZOSYN)  IV 3.375 g (08/28/21 0310)     LOS: 3 days        Ellie Lunch, student [facility] Pager [phone]  If 7PM-7AM, please contact night-coverage [email or web address] Password [facility] 08/28/2021, 10:27 AM

## 2021-08-28 NOTE — Progress Notes (Signed)
ANTICOAGULATION CONSULT NOTE -  Pharmacy Consult for heparin Indication: possible meseternic stenosis, embolism, ischemic colitis  No Known Allergies  Patient Measurements: Height: 5\' 6"  (167.6 cm) Weight: 60 kg (132 lb 4.4 oz) IBW/kg (Calculated) : 63.8 Heparin Dosing Weight: 60 kg  Vital Signs: Temp: 98.6 F (37 C) (09/27 0451) Temp Source: Oral (09/27 0451) BP: 114/67 (09/27 0451) Pulse Rate: 72 (09/27 0451)  Labs: Recent Labs    08/26/21 0118 08/27/21 0602 08/28/21 0601 08/28/21 1400  HGB 12.9* 11.4* 11.5*  --   HCT 38.3* 35.0* 33.2*  --   PLT 177 170 185  --   HEPARINUNFRC 0.55 0.33 0.19* 0.35  CREATININE 0.79 0.81 0.70  --      Estimated Creatinine Clearance: 67.7 mL/min (by C-G formula based on SCr of 0.7 mg/dL).   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   08/24/2021   Loperamide HCl (IMODIUM A-D PO) Take 1 tablet by mouth daily as needed (loose stools).   08/24/2021   ALPRAZolam (XANAX) 0.5 MG tablet Take 1 tablet (0.5 mg total) by mouth at bedtime as needed for anxiety. (Patient not taking: No sig reported) 30 tablet 1 Not Taking   potassium chloride SA (K-DUR) 20 MEQ tablet Take 1 tablet (20 mEq total) by mouth daily. (Patient not taking: No sig reported) 14 tablet 0 Not Taking   zolpidem (AMBIEN) 5 MG tablet Take 1-2 tablets (5-10 mg total) by mouth at bedtime as needed for sleep. (Patient not taking: Reported on 08/25/2021) 15 tablet  0 Not Taking    Assessment: Pharmacy consulted to dose heparin in patient with possible mesenteric stenosis/embolism. Patient is not on anticoagulation prior to admission.  CBC WNL  HL 0.35- therapeutic  Goal of Therapy:  Heparin level 0.3-0.7 units/ml Monitor platelets by anticoagulation protocol: Yes   Plan:  Continue heparin infusion at 1250 units/hr Hold heparin starting at midnight tonight for procedure tomorrow. Check anti-Xa level in 8 hours and daily. Continue to monitor H&H and platelets.  Margot Ables, PharmD Clinical Pharmacist 08/28/2021 3:31 PM

## 2021-08-28 NOTE — Progress Notes (Signed)
GI attending note.  Called pathology at [facility].  Biopsy will not be out until tomorrow. Appreciate Dr. Constance Haw note. Patient's sister at at bedside.  She says she was not able to talk with Ms. Para March, RN regarding ostomy care and teaching as she had stepped out. Patient is scheduled for surgery tomorrow.

## 2021-08-29 ENCOUNTER — Other Ambulatory Visit: Payer: Self-pay

## 2021-08-29 ENCOUNTER — Inpatient Hospital Stay (HOSPITAL_COMMUNITY): Payer: Medicare HMO | Admitting: Anesthesiology

## 2021-08-29 ENCOUNTER — Encounter (HOSPITAL_COMMUNITY)
Admission: EM | Disposition: A | Discharge status: Skilled Nursing Facility | Payer: Self-pay | Source: Home / Self Care | Attending: Family Medicine

## 2021-08-29 ENCOUNTER — Encounter (HOSPITAL_COMMUNITY): Payer: Self-pay | Admitting: Internal Medicine

## 2021-08-29 ENCOUNTER — Encounter: Payer: Self-pay | Admitting: Internal Medicine

## 2021-08-29 DIAGNOSIS — K559 Vascular disorder of intestine, unspecified: Secondary | ICD-10-CM

## 2021-08-29 DIAGNOSIS — Z515 Encounter for palliative care: Secondary | ICD-10-CM | POA: Diagnosis not present

## 2021-08-29 DIAGNOSIS — K55039 Acute (reversible) ischemia of large intestine, extent unspecified: Secondary | ICD-10-CM | POA: Diagnosis not present

## 2021-08-29 DIAGNOSIS — Z7189 Other specified counseling: Secondary | ICD-10-CM | POA: Diagnosis not present

## 2021-08-29 DIAGNOSIS — F0631 Mood disorder due to known physiological condition with depressive features: Secondary | ICD-10-CM | POA: Diagnosis not present

## 2021-08-29 HISTORY — PX: COLECTOMY WITH COLOSTOMY CREATION/HARTMANN PROCEDURE: SHX6598

## 2021-08-29 LAB — CBC
HCT: 37.2 % — ABNORMAL LOW (ref 39.0–52.0)
Hemoglobin: 12.7 g/dL — ABNORMAL LOW (ref 13.0–17.0)
MCH: 30.4 pg (ref 26.0–34.0)
MCHC: 34.1 g/dL (ref 30.0–36.0)
MCV: 89 fL (ref 80.0–100.0)
Platelets: 236 10*3/uL (ref 150–400)
RBC: 4.18 MIL/uL — ABNORMAL LOW (ref 4.22–5.81)
RDW: 13.4 % (ref 11.5–15.5)
WBC: 8.2 10*3/uL (ref 4.0–10.5)
nRBC: 0 % (ref 0.0–0.2)

## 2021-08-29 LAB — BASIC METABOLIC PANEL
Anion gap: 6 (ref 5–15)
BUN: <5 mg/dL — ABNORMAL LOW (ref 8–23)
CO2: 24 mmol/L (ref 22–32)
Calcium: 8.5 mg/dL — ABNORMAL LOW (ref 8.9–10.3)
Chloride: 107 mmol/L (ref 98–111)
Creatinine, Ser: 0.64 mg/dL (ref 0.61–1.24)
GFR, Estimated: >60 mL/min (ref >60)
Glucose, Bld: 117 mg/dL — ABNORMAL HIGH (ref 70–99)
Potassium: 3.8 mmol/L (ref 3.5–5.1)
Sodium: 137 mmol/L (ref 135–145)

## 2021-08-29 LAB — GLUCOSE, CAPILLARY
Glucose-Capillary: 128 mg/dL — ABNORMAL HIGH (ref 70–99)
Glucose-Capillary: 141 mg/dL — ABNORMAL HIGH (ref 70–99)
Glucose-Capillary: 118 mg/dL — ABNORMAL HIGH (ref 70–99)

## 2021-08-29 LAB — SURGICAL PATHOLOGY

## 2021-08-29 LAB — MAGNESIUM: Magnesium: 1.9 mg/dL (ref 1.7–2.4)

## 2021-08-29 SURGERY — COLECTOMY, WITH COLOSTOMY CREATION
Anesthesia: General | Site: Abdomen

## 2021-08-29 MED ORDER — LORAZEPAM 1 MG PO TABS
1.0000 mg | ORAL_TABLET | Freq: Every day | ORAL | Status: AC
  Administered 2021-08-30 – 2021-09-02 (×4): 1 mg via ORAL
  Filled 2021-08-29 (×4): qty 1

## 2021-08-29 MED ORDER — LACTATED RINGERS IV SOLN: INTRAVENOUS | Status: DC | PRN

## 2021-08-29 MED ORDER — LACTATED RINGERS IV SOLN: INTRAVENOUS | Status: DC

## 2021-08-29 MED ORDER — GLYCOPYRROLATE PF 0.2 MG/ML IJ SOSY
PREFILLED_SYRINGE | INTRAMUSCULAR | Status: AC
  Filled 2021-08-29: qty 1

## 2021-08-29 MED ORDER — ONDANSETRON HCL 4 MG/2ML IJ SOLN
INTRAMUSCULAR | Status: AC
  Filled 2021-08-29: qty 2

## 2021-08-29 MED ORDER — PROPOFOL 10 MG/ML IV BOLUS
INTRAVENOUS | Status: AC
  Filled 2021-08-29: qty 20

## 2021-08-29 MED ORDER — SODIUM CHLORIDE 0.9 % IR SOLN
Status: DC | PRN
  Administered 2021-08-29: 1000 mL

## 2021-08-29 MED ORDER — EPHEDRINE SULFATE-NACL 50-0.9 MG/10ML-% IV SOSY
PREFILLED_SYRINGE | INTRAVENOUS | Status: DC | PRN
  Administered 2021-08-29: 10 mg via INTRAVENOUS

## 2021-08-29 MED ORDER — EPHEDRINE 5 MG/ML INJ
INTRAVENOUS | Status: AC
  Filled 2021-08-29: qty 10

## 2021-08-29 MED ORDER — ONDANSETRON HCL 4 MG/2ML IJ SOLN
4.0000 mg | Freq: Once | INTRAMUSCULAR | Status: AC | PRN
  Administered 2021-08-29: 4 mg via INTRAVENOUS
  Filled 2021-08-29: qty 2

## 2021-08-29 MED ORDER — BUPIVACAINE LIPOSOME 1.3 % IJ SUSP
INTRAMUSCULAR | Status: AC
  Filled 2021-08-29: qty 20

## 2021-08-29 MED ORDER — HYDROMORPHONE HCL 1 MG/ML IJ SOLN
0.2500 mg | INTRAMUSCULAR | Status: DC | PRN
  Administered 2021-08-29 (×2): 0.5 mg via INTRAVENOUS
  Filled 2021-08-29 (×2): qty 0.5

## 2021-08-29 MED ORDER — SUCCINYLCHOLINE CHLORIDE 200 MG/10ML IV SOSY
PREFILLED_SYRINGE | INTRAVENOUS | Status: AC
  Filled 2021-08-29: qty 10

## 2021-08-29 MED ORDER — OXYCODONE HCL 5 MG PO TABS
5.0000 mg | ORAL_TABLET | ORAL | Status: DC | PRN
  Administered 2021-08-30 – 2021-09-03 (×9): 10 mg via ORAL
  Administered 2021-09-03 – 2021-09-04 (×3): 5 mg via ORAL
  Administered 2021-09-04 – 2021-09-05 (×2): 10 mg via ORAL
  Filled 2021-08-29: qty 1
  Filled 2021-08-29 (×2): qty 2
  Filled 2021-08-29: qty 1
  Filled 2021-08-29 (×8): qty 2
  Filled 2021-08-29: qty 1
  Filled 2021-08-29 (×2): qty 2

## 2021-08-29 MED ORDER — LIDOCAINE HCL (PF) 2 % IJ SOLN
INTRAMUSCULAR | Status: AC
  Filled 2021-08-29: qty 5

## 2021-08-29 MED ORDER — DEXAMETHASONE SODIUM PHOSPHATE 10 MG/ML IJ SOLN
INTRAMUSCULAR | Status: DC | PRN
  Administered 2021-08-29: 5 mg via INTRAVENOUS

## 2021-08-29 MED ORDER — BUPIVACAINE LIPOSOME 1.3 % IJ SUSP
INTRAMUSCULAR | Status: DC | PRN
  Administered 2021-08-29: 20 mL

## 2021-08-29 MED ORDER — ROCURONIUM BROMIDE 10 MG/ML (PF) SYRINGE
PREFILLED_SYRINGE | INTRAVENOUS | Status: DC | PRN
  Administered 2021-08-29: 50 mg via INTRAVENOUS

## 2021-08-29 MED ORDER — FENTANYL CITRATE (PF) 100 MCG/2ML IJ SOLN
INTRAMUSCULAR | Status: DC | PRN
  Administered 2021-08-29: 50 ug via INTRAVENOUS
  Administered 2021-08-29 (×2): 25 ug via INTRAVENOUS
  Administered 2021-08-29 (×2): 50 ug via INTRAVENOUS

## 2021-08-29 MED ORDER — FENTANYL CITRATE (PF) 100 MCG/2ML IJ SOLN
INTRAMUSCULAR | Status: AC
  Filled 2021-08-29: qty 2

## 2021-08-29 MED ORDER — HEMOSTATIC AGENTS (NO CHARGE) OPTIME
TOPICAL | Status: DC | PRN
  Administered 2021-08-29: 1 via TOPICAL

## 2021-08-29 MED ORDER — PHENYLEPHRINE 40 MCG/ML (10ML) SYRINGE FOR IV PUSH (FOR BLOOD PRESSURE SUPPORT)
PREFILLED_SYRINGE | INTRAVENOUS | Status: AC
  Filled 2021-08-29: qty 20

## 2021-08-29 MED ORDER — PHENYLEPHRINE 40 MCG/ML (10ML) SYRINGE FOR IV PUSH (FOR BLOOD PRESSURE SUPPORT)
PREFILLED_SYRINGE | INTRAVENOUS | Status: AC
  Filled 2021-08-29: qty 10

## 2021-08-29 MED ORDER — ROCURONIUM BROMIDE 10 MG/ML (PF) SYRINGE
PREFILLED_SYRINGE | INTRAVENOUS | Status: AC
  Filled 2021-08-29: qty 20

## 2021-08-29 MED ORDER — LIDOCAINE 2% (20 MG/ML) 5 ML SYRINGE
INTRAMUSCULAR | Status: DC | PRN
  Administered 2021-08-29: 40 mg via INTRAVENOUS

## 2021-08-29 MED ORDER — ORAL CARE MOUTH RINSE
15.0000 mL | Freq: Once | OROMUCOSAL | Status: DC
Start: 2021-08-29 — End: 2021-09-05

## 2021-08-29 MED ORDER — CHLORHEXIDINE GLUCONATE 0.12 % MT SOLN
15.0000 mL | Freq: Once | OROMUCOSAL | Status: DC
Start: 2021-08-29 — End: 2021-09-05
  Filled 2021-08-29: qty 15

## 2021-08-29 MED ORDER — PROPOFOL 10 MG/ML IV BOLUS
INTRAVENOUS | Status: DC | PRN
  Administered 2021-08-29: 110 mg via INTRAVENOUS

## 2021-08-29 MED ORDER — SUGAMMADEX SODIUM 200 MG/2ML IV SOLN
INTRAVENOUS | Status: DC | PRN
  Administered 2021-08-29: 200 mg via INTRAVENOUS

## 2021-08-29 MED ORDER — PHENYLEPHRINE 40 MCG/ML (10ML) SYRINGE FOR IV PUSH (FOR BLOOD PRESSURE SUPPORT)
PREFILLED_SYRINGE | INTRAVENOUS | Status: DC | PRN
  Administered 2021-08-29: 40 ug via INTRAVENOUS
  Administered 2021-08-29: 80 ug via INTRAVENOUS

## 2021-08-29 MED ORDER — ARTIFICIAL TEARS OPHTHALMIC OINT
TOPICAL_OINTMENT | OPHTHALMIC | Status: AC
  Filled 2021-08-29: qty 3.5

## 2021-08-29 SURGICAL SUPPLY — 53 items
APL PRP STRL LF DISP 70% ISPRP (MISCELLANEOUS) ×1
APL SWBSTK 6 STRL LF DISP (MISCELLANEOUS) ×1
APPLICATOR COTTON TIP 6 STRL (MISCELLANEOUS) IMPLANT
APPLICATOR COTTON TIP 6IN STRL (MISCELLANEOUS) ×2 IMPLANT
BARRIER SKIN 2 1/4 (WOUND CARE) ×2 IMPLANT
BARRIER SKIN OD1.75 2 1/4 FLNG (WOUND CARE) IMPLANT
BRR SKN FLT 1.75X2.25 2 PC (WOUND CARE) ×1
CHLORAPREP W/TINT 26 (MISCELLANEOUS) ×2 IMPLANT
CLOTH BEACON ORANGE TIMEOUT ST (SAFETY) ×2 IMPLANT
COVER LIGHT HANDLE STERIS (MISCELLANEOUS) ×4 IMPLANT
DRSG OPSITE POSTOP 4X8 (GAUZE/BANDAGES/DRESSINGS) ×1 IMPLANT
ELECT BLADE 6 FLAT ULTRCLN (ELECTRODE) ×1 IMPLANT
ELECT REM PT RETURN 9FT ADLT (ELECTROSURGICAL) ×2
ELECTRODE REM PT RTRN 9FT ADLT (ELECTROSURGICAL) ×1 IMPLANT
GAUZE 4X4 16PLY ~~LOC~~+RFID DBL (SPONGE) ×1 IMPLANT
GAUZE SPONGE 4X4 12PLY STRL (GAUZE/BANDAGES/DRESSINGS) ×2 IMPLANT
GLOVE SURG ENC MOIS LTX SZ6.5 (GLOVE) ×4 IMPLANT
GLOVE SURG POLYISO LF SZ7.5 (GLOVE) ×4 IMPLANT
GLOVE SURG UNDER POLY LF SZ7 (GLOVE) ×2 IMPLANT
GOWN STRL REUS W/TWL LRG LVL3 (GOWN DISPOSABLE) ×6 IMPLANT
HANDLE SUCTION POOLE (INSTRUMENTS) ×1 IMPLANT
HEMOSTAT SURGICEL 4X8 (HEMOSTASIS) ×1 IMPLANT
INST SET MAJOR GENERAL (KITS) ×2 IMPLANT
KIT TURNOVER KIT A (KITS) ×2 IMPLANT
LIGASURE IMPACT 36 18CM CVD LR (INSTRUMENTS) ×2 IMPLANT
MANIFOLD NEPTUNE II (INSTRUMENTS) ×2 IMPLANT
NDL HYPO 18GX1.5 BLUNT FILL (NEEDLE) IMPLANT
NDL HYPO 21X1.5 SAFETY (NEEDLE) ×1 IMPLANT
NEEDLE HYPO 18GX1.5 BLUNT FILL (NEEDLE) ×2 IMPLANT
NEEDLE HYPO 21X1.5 SAFETY (NEEDLE) ×2 IMPLANT
NS IRRIG 1000ML POUR BTL (IV SOLUTION) ×4 IMPLANT
PACK ABDOMINAL MAJOR (CUSTOM PROCEDURE TRAY) ×2 IMPLANT
PAD ARMBOARD 7.5X6 YLW CONV (MISCELLANEOUS) ×2 IMPLANT
PENCIL SMOKE EVACUATOR (MISCELLANEOUS) ×1 IMPLANT
POUCH OSTOMY 2 PC DRNBL 2.25 (WOUND CARE) IMPLANT
POUCH OSTOMY DRNBL 2 1/4 (WOUND CARE) ×2
RELOAD PROXIMATE 75MM BLUE (ENDOMECHANICALS) ×2 IMPLANT
RELOAD STAPLE 75 3.8 BLU REG (ENDOMECHANICALS) IMPLANT
RETRACTOR WND ALEXIS-O 25 LRG (MISCELLANEOUS) IMPLANT
RTRCTR WOUND ALEXIS O 25CM LRG (MISCELLANEOUS) ×2
SET BASIN LINEN APH (SET/KITS/TRAYS/PACK) ×2 IMPLANT
SPONGE SURGIFOAM ABS GEL 100 (HEMOSTASIS) ×1 IMPLANT
SPONGE T-LAP 18X18 ~~LOC~~+RFID (SPONGE) ×3 IMPLANT
STAPLER CVD CUT GN 40 RELOAD (ENDOMECHANICALS) ×2 IMPLANT
STAPLER CVD CUT GRN 40 RELOAD (ENDOMECHANICALS) IMPLANT
STAPLER PROXIMATE 75MM BLUE (STAPLE) ×1 IMPLANT
STAPLER VISISTAT (STAPLE) ×2 IMPLANT
SUCTION POOLE HANDLE (INSTRUMENTS) ×2
SUT CHROMIC 0 SH (SUTURE) ×4 IMPLANT
SUT PDS AB CT VIOLET #0 27IN (SUTURE) ×2 IMPLANT
SUT SILK 3 0 SH CR/8 (SUTURE) ×1 IMPLANT
SYR 20ML LL LF (SYRINGE) ×3 IMPLANT
TRAY FOLEY MTR SLVR 16FR STAT (SET/KITS/TRAYS/PACK) ×2 IMPLANT

## 2021-08-29 NOTE — Progress Notes (Signed)
Reviewed biopsy results with Dr. Melina Copa of colon pathology  biopsy reveals necrotic tissue.

## 2021-08-29 NOTE — Interval H&P Note (Signed)
History and Physical Interval Note:  08/29/2021 11:56 AM  Wesley [name]  has presented today for surgery, with the diagnosis of colitis radiation versus ischemic.  The various methods of treatment have been discussed with the patient and family. After consideration of risks, benefits and other options for treatment, the patient has consented to  Procedure(s): COLECTOMY WITH COLOSTOMY CREATION/HARTMANN PROCEDURE (N/A) as a surgical intervention.  The patient's history has been reviewed, patient examined, no change in status, stable for surgery.  I have reviewed the patient's chart and labs.  Questions were answered to the patient's satisfaction.    Palliative colostomy colectomy for colitis from combination of radiation, ischemia with continued pain and inability to take diet.   Virl Cagey

## 2021-08-29 NOTE — Anesthesia Postprocedure Evaluation (Signed)
Anesthesia Post Note  Patient: Wesley [name]  Procedure(s) Performed: COLECTOMY WITH COLOSTOMY CREATION/HARTMANN PROCEDURE (Abdomen)  Patient location during evaluation: PACU Anesthesia Type: General Level of consciousness: awake and alert and oriented Pain management: pain level controlled Vital Signs Assessment: post-procedure vital signs reviewed and stable Respiratory status: spontaneous breathing and respiratory function stable Cardiovascular status: blood pressure returned to baseline and stable Postop Assessment: no apparent nausea or vomiting Anesthetic complications: no   No notable events documented.   Last Vitals:  Vitals:   08/29/21 1615 08/29/21 1657  BP: (!) 108/50 (!) 109/49  Pulse: 77 78  Resp: 16 16  Temp:    SpO2: 98% 98%    Last Pain:  Vitals:   08/29/21 1657  TempSrc:   PainSc: Asleep                 Sergio Zawislak C Reyaan Thoma

## 2021-08-29 NOTE — Progress Notes (Signed)
PROGRESS NOTE    Wesley [name]  NUU:[id number] DOB: March 30, 1946 DOA: 08/24/2021 PCP: Jearld Fenton, NP   Brief Narrative: Riccardo Dubin, 31 yom, presented 08/24/21 with abdominal pain and bloody diarrhea. Hx of rectal adenocarcinoma, depression, gerd, tobacco use, hyperlipidemia. He reported lower abdominal pain with a bowel movement, reports blood in the stool, dark red in color. CT abd pelvis w contrast showed concern for IMA stenosis and an ischemic portion of sigmoid colon. Flexible sigmoidoscopy on 08/27/21 also shared impression of sigmoid colon ischemia, biopsies showed necrotic tissue. Proctocolectomy with colostomy planned for later today, 08/29/21.    Assessment & Plan:   Principal Problem:  Acute abdominal pain due to ischemic colitis -flexible sigmoidoscopy and ct both suggest ischemia of the sigmoid colon -pain control, hydromorphone 0.5-1mg  q2hrs prn -iv zosyn in a pre-emptive manner, in the event of perforation of the ischemic bowel -npo 09/28 and heparin stopped midnight 09/28, planned proctocolectomy / colostomy 09/28 -biopsies from flex-sig 9/26 show necrotic tissue  Active problems:   Anxiety/depression -zoloft 25mg  was started 09/25 by psychiatry -plausible history of persistent, untreated depression, exacerbated by the loss of his wife and now acutely more so by his current health -continue alprazolam  0.5mg  qhs prn -currently agreeable, hopeful this proctocolectomy will resolve his pain  Hx of rectal adenocarcinoma -biopsies sent from flex-sig -proctocolectomy / colostomy planned 09/28 -previous radiation treatment  Hematochezia -initial report of bloody stool -hemoglobin stable as of 09/28 12.7  Hypokalemia -3.3 9/27, 3.8 9/28 Repeat labs tomorrow morning         DVT prophylaxis: heparin d/c 0000 08/29/21 pre-op.  Code Status:  dnr Family Communication: sister and daugter present Disposition Plan: re-evaluate after  colostomy   Consultants:  General surgery Gastroenterology Palliative Care Psychiatry  Procedures:   08/27/2021  Flexible Sigmoidoscopy for hematochezia and abnormal ct of GI tract Flexible Sigmoidoscopy to 20 cm.; rectal mucosa - distal 10 cm appeared friable with prominent neovascular changes; rectal mucosa to distal sigmoid (10-20 cm from the anal verge) appeared exuberantly inflamed with deep circumferential ulceration with underlying dusky appearing mucosa. 4 mm stricture at 20 cm from anal verge precluded further advancement of the scope. Biopsies taken. I suspect rectosigmoid findings are result of both radiation and ischemia effect. There is no good medical therapy for this finding.  08/29/21 Permanent colostomy and proctocolectomy  Antimicrobials: zosyn 3.375g iv q8hrs Cefotetan 2g iv, pre-op   Subjective: Patient is uncomfortable with abdominal pain, reports lower abd pain with any oral intake  Objective: Vitals:   08/28/21 0451 08/28/21 1759 08/28/21 2040 08/29/21 0515  BP: 114/67 119/74 115/62 (!) 123/52  Pulse: 72 63 68 73  Resp: 16 20 18 16   Temp: 98.6 F (37 C) 97.8 F (36.6 C) 98.2 F (36.8 C) 98.1 F (36.7 C)  TempSrc: Oral Oral  Oral  SpO2: 95% 97% 93% 99%  Weight:      Height:        Intake/Output Summary (Last 24 hours) at 08/29/2021 1048 Last data filed at 08/29/2021 0500 Gross per 24 hour  Intake 120 ml  Output 4500 ml  Net -4380 ml   Filed Weights   08/24/21 2043 08/27/21 1504  Weight: 60 kg 60 kg    Examination:  General exam: Appears uncomfortable, no acute distress, fatigued from length of illness and poor sleep  Respiratory system: Clear to auscultation. Respiratory effort normal.  Cardiovascular system: S1 & S2 heard, RRR. No JVD, murmurs, rubs, gallops or clicks. No pedal edema.  Gastrointestinal system: Abdomen is tender to palpation  Central nervous system: Alert and oriented. No focal neurological deficits.  Extremities:  moves all extremities appropriately  Skin: No obvious rashes, lesions or ulcers  Psychiatry: Judgement and insight appear normal.     Data Reviewed: I have personally reviewed following labs and imaging studies  CBC: Recent Labs  Lab 08/25/21 0100 08/26/21 0118 08/27/21 0602 08/28/21 0601 08/29/21 0547  WBC 15.8* 12.4* 10.7* 7.8 8.2  HGB 15.1 12.9* 11.4* 11.5* 12.7*  HCT 44.3 38.3* 35.0* 33.2* 37.2*  MCV 91.5 92.1 92.1 88.3 89.0  PLT 198 177 170 185 694   Basic Metabolic Panel: Recent Labs  Lab 08/25/21 0100 08/26/21 0118 08/27/21 0602 08/28/21 0601 08/29/21 0547  NA 135 131* 133* 134* 137  K 3.6 3.5 3.5 3.3* 3.8  CL 101 102 106 105 107  CO2 26 22 16* 23 24  GLUCOSE 109* 82 64* 110* 117*  BUN 11 12 10  5* <5*  CREATININE 0.77 0.79 0.81 0.70 0.64  CALCIUM 9.0 7.7* 7.7* 7.8* 8.5*  MG  --  1.8 1.8 1.7 1.9   GFR: Estimated Creatinine Clearance: 67.7 mL/min (by C-G formula based on SCr of 0.64 mg/dL). Liver Function Tests: Recent Labs  Lab 08/25/21 0100 08/26/21 0118 08/27/21 0602  AST 19 18 18   ALT 13 12 12   ALKPHOS 63 55 54  BILITOT 1.1 1.1 1.1  PROT 7.2 6.1* 5.3*  ALBUMIN 3.8 3.0* 2.5*   Recent Labs  Lab 08/25/21 0100  LIPASE 21   No results for input(s): AMMONIA in the last 168 hours. Coagulation Profile: No results for input(s): INR, PROTIME in the last 168 hours. Cardiac Enzymes: No results for input(s): CKTOTAL, CKMB, CKMBINDEX, TROPONINI in the last 168 hours. BNP (last 3 results) No results for input(s): PROBNP in the last 8760 hours. HbA1C: No results for input(s): HGBA1C in the last 72 hours. CBG: Recent Labs  Lab 08/28/21 0539 08/28/21 1100 08/28/21 1804 08/28/21 2349 08/29/21 0410  GLUCAP 115* 122* 132* 128* 141*   Lipid Profile: No results for input(s): CHOL, HDL, LDLCALC, TRIG, CHOLHDL, LDLDIRECT in the last 72 hours. Thyroid Function Tests: No results for input(s): TSH, T4TOTAL, FREET4, T3FREE, THYROIDAB in the last 72  hours. Anemia Panel: No results for input(s): VITAMINB12, FOLATE, FERRITIN, TIBC, IRON, RETICCTPCT in the last 72 hours. Urine analysis:    Component Value Date/Time   COLORURINE YELLOW 08/25/2021 0500   APPEARANCEUR CLEAR 08/25/2021 0500   LABSPEC <1.005 (L) 08/25/2021 0500   PHURINE 5.0 08/25/2021 0500   GLUCOSEU NEGATIVE 08/25/2021 0500   HGBUR SMALL (A) 08/25/2021 0500   BILIRUBINUR NEGATIVE 08/25/2021 0500   KETONESUR NEGATIVE 08/25/2021 0500   PROTEINUR NEGATIVE 08/25/2021 0500   NITRITE NEGATIVE 08/25/2021 0500   LEUKOCYTESUR NEGATIVE 08/25/2021 0500   Sepsis Labs: @LABRCNTIP (procalcitonin:4,lacticidven:4)  ) Recent Results (from the past 240 hour(s))  Resp Panel by RT-PCR (Flu A&B, Covid) Nasopharyngeal Swab     Status: None   Collection Time: 08/25/21  6:06 AM   Specimen: Nasopharyngeal Swab; Nasopharyngeal(NP) swabs in vial transport medium  Result Value Ref Range Status   SARS Coronavirus 2 by RT PCR NEGATIVE NEGATIVE Final    Comment: (NOTE) SARS-CoV-2 target nucleic acids are NOT DETECTED.  The SARS-CoV-2 RNA is generally detectable in upper respiratory specimens during the acute phase of infection. The lowest concentration of SARS-CoV-2 viral copies this assay can detect is 138 copies/mL. A negative result does not preclude SARS-Cov-2 infection and should not be used  as the sole basis for treatment or other patient management decisions. A negative result may occur with  improper specimen collection/handling, submission of specimen other than nasopharyngeal swab, presence of viral mutation(s) within the areas targeted by this assay, and inadequate number of viral copies(<138 copies/mL). A negative result must be combined with clinical observations, patient history, and epidemiological information. The expected result is Negative.  Fact Sheet for Patients:  EntrepreneurPulse.com.au  Fact Sheet for Healthcare Providers:   IncredibleEmployment.be  This test is no t yet approved or cleared by the [place] FDA and  has been authorized for detection and/or diagnosis of SARS-CoV-2 by FDA under an Emergency Use Authorization (EUA). This EUA will remain  in effect (meaning this test can be used) for the duration of the COVID-19 declaration under Section 564(b)(1) of the Act, 21 U.S.C.section 360bbb-3(b)(1), unless the authorization is terminated  or revoked sooner.       Influenza A by PCR NEGATIVE NEGATIVE Final   Influenza B by PCR NEGATIVE NEGATIVE Final    Comment: (NOTE) The Xpert Xpress SARS-CoV-2/FLU/RSV plus assay is intended as an aid in the diagnosis of influenza from Nasopharyngeal swab specimens and should not be used as a sole basis for treatment. Nasal washings and aspirates are unacceptable for Xpert Xpress SARS-CoV-2/FLU/RSV testing.  Fact Sheet for Patients: EntrepreneurPulse.com.au  Fact Sheet for Healthcare Providers: IncredibleEmployment.be  This test is not yet approved or cleared by the [place] FDA and has been authorized for detection and/or diagnosis of SARS-CoV-2 by FDA under an Emergency Use Authorization (EUA). This EUA will remain in effect (meaning this test can be used) for the duration of the COVID-19 declaration under Section 564(b)(1) of the Act, 21 U.S.C. section 360bbb-3(b)(1), unless the authorization is terminated or revoked.  Performed at [facility], [address], [place], [place] [address]   Surgical pcr screen     Status: None   Collection Time: 08/28/21  1:26 PM   Specimen: Nasal Mucosa; Nasal Swab  Result Value Ref Range Status   MRSA, PCR NEGATIVE NEGATIVE Final   Staphylococcus aureus NEGATIVE NEGATIVE Final    Comment: (NOTE) The Xpert SA Assay (FDA approved for NASAL specimens in patients 44 years of age and older), is one component of a comprehensive surveillance program. It is not  intended to diagnose infection nor to guide or monitor treatment. Performed at [facility], [address], [place], [place] [address]          Radiology Studies: No results found.      Scheduled Meds:  Chlorhexidine Gluconate Cloth  6 each Topical Daily   sertraline  25 mg Oral Daily   Continuous Infusions:  cefoTEtan (CEFOTAN) IV     dextrose 5 % and 0.9% NaCl 75 mL/hr at 08/28/21 0304   piperacillin-tazobactam (ZOSYN)  IV 3.375 g (08/29/21 0500)     LOS: 4 days        Ellie Lunch, student [facility] Pager [phone]  If 7PM-7AM, please contact night-coverage [email or web address] Password [facility] 08/29/2021, 10:48 AM

## 2021-08-29 NOTE — Progress Notes (Signed)
Palliative: [name], Wesley [name], is sitting up in bed.  Surgeon, Dr. Constance Haw, is at bedside talking with Wesley [name] and his two daughters about surgery today.  All questions answered, support provided.  Surgeon Constance Haw exits to prepare for surgery.  Daughter's phone number verified.  We talked about symptom management after surgery.  I encouraged Wesley [name] that he will be given pain management meds, but is expected to sleep most of today.  Wesley [name] shares that he would like medicines to help him sleep.  Nightly sleeping pill added.    We talked about discharge to short-term rehab, if qualified.  I share that he will be evaluated maybe as early as Thursday, but possibly Friday to see if he qualifies for/needs rehab.  He and daughters are agreeable to short-term rehab and are requesting facility in [place].  We talked about discharge from short-term rehab to [facility] home for a few weeks.  His daughters feel that this is a good idea and encouraged him.  Conference with attending, bedside nursing staff, transition of care team related to patient condition, needs, goals of care.  Plan: Permanent colostomy 9/28.  Short-term rehab preference in [place], if qualified.  Agreeable to outpatient palliative, service provider not discussed at this point.  52 minutes Wesley Axe, NP Palliative medicine team Team phone [phone] Greater than 50% of this time was spent counseling and coordinating care related to the above assessment and plan.

## 2021-08-29 NOTE — Progress Notes (Signed)
Pharmacy Antibiotic Note  Wesley [name] is a 75 y.o. male admitted on 08/24/2021 with  intra-abdominal infection .  Pharmacy has been consulted for Zosyn dosing.  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 Having pain and continued ischemic changes. Plan is for colectomy  today( 9/28)  Plan: Continue Zosyn 3.375g IV q8h (4 hour infusion). F/U cxs and clinical progress, LOT Monitor V/S, labs  Height: 5\' 6"  (167.6 cm) Weight: 60 kg (132 lb 4.4 oz) IBW/kg (Calculated) : 63.8  Temp (24hrs), Avg:98 F (36.7 C), Min:97.8 F (36.6 C), Max:98.2 F (36.8 C)  Recent Labs  Lab 08/25/21 0100 08/25/21 0752 08/25/21 0958 08/26/21 0118 08/27/21 0602 08/28/21 0601 08/29/21 0547  WBC 15.8*  --   --  12.4* 10.7* 7.8 8.2  CREATININE 0.77  --   --  0.79 0.81 0.70 0.64  LATICACIDVEN  --  1.1 1.0  --   --   --   --      Estimated Creatinine Clearance: 67.7 mL/min (by C-G formula based on SCr of 0.64 mg/dL).    No Known Allergies  Antimicrobials this admission: Zosyn 9/24 >>    Microbiology results: 9/27 MRSA PCR is negative  Thank you for allowing pharmacy to be a part of this patient's care.  Isac Sarna, BS Pharm D, BCPS Clinical Pharmacist Pager [phone] 08/29/2021 9:10 AM

## 2021-08-29 NOTE — Progress Notes (Signed)
Pt arrived to room 337 via bed from PACU. His IV infusing without signs of infiltration. Pt is drowsy but easily awakens. Oriented x3. Answers questions appropriately. VS stable. Foley intact draining clear yellow urine. Pt offered oral fluids, refused. Daughters at bedside. Bed alarm on for safety.

## 2021-08-29 NOTE — Anesthesia Preprocedure Evaluation (Addendum)
Anesthesia Evaluation  Patient identified by MRN, date of birth, ID band Patient awake    Reviewed: Allergy & Precautions, NPO status , Patient's Chart, lab work & pertinent test results  Airway Mallampati: II  TM Distance: >3 FB Neck ROM: Full    Dental  (+) Dental Advisory Given, Missing   Pulmonary Current Smoker and Patient abstained from smoking.,    Pulmonary exam normal breath sounds clear to auscultation       Cardiovascular Exercise Tolerance: Good Normal cardiovascular exam Rhythm:Regular Rate:Normal     Neuro/Psych PSYCHIATRIC DISORDERS Depression    GI/Hepatic Neg liver ROS, GERD  ,  Endo/Other  negative endocrine ROS  Renal/GU negative Renal ROS     Musculoskeletal negative musculoskeletal ROS (+)   Abdominal   Peds  Hematology negative hematology ROS (+)   Anesthesia Other Findings Rectal cancer  IMPRESSION: Short-segment ischemic colitis involving the terminal sigmoid colon. Given the relative focality and unusual location, additional considerations should include arterial embolism and radiation colitis in the appropriate clinical setting (i.e. Was this segment of bowel within the treatment zone).  Mild coronary artery calcification.  Minimal nonobstructing left nephrolithiasis.  Status post cholecystectomy with probable post cholecystectomy change involving the biliary tree.  Variant anatomy with left-sided IVC.  Peripheral vascular disease with high-grade stenosis of the inferior mesenteric artery and segmental occlusion of the right lower extremity arterial inflow with reconstitution via the epigastric arcade.  Aortic Atherosclerosis (ICD10-I70.0).   Electronically Signed   By: Fidela Salisbury M.D.   On: 08/25/2021 02:51  Reproductive/Obstetrics negative OB ROS                             Anesthesia Physical Anesthesia Plan  ASA:  4  Anesthesia Plan: General   Post-op Pain Management:    Induction: Intravenous  PONV Risk Score and Plan: 3 and Ondansetron and Dexamethasone  Airway Management Planned: Oral ETT  Additional Equipment:   Intra-op Plan:   Post-operative Plan: Extubation in OR  Informed Consent: I have reviewed the patients History and Physical, chart, labs and discussed the procedure including the risks, benefits and alternatives for the proposed anesthesia with the patient or authorized representative who has indicated his/her understanding and acceptance.    Discussed DNR with patient and Suspend DNR.   Dental advisory given  Plan Discussed with: CRNA and Surgeon  Anesthesia Plan Comments:        Anesthesia Quick Evaluation

## 2021-08-29 NOTE — Progress Notes (Signed)
[facility]  Ostomy completed. Diet as tolerated. Heparin can stop. Zosyn for 24 more hours given case.   Updated daughter.   Curlene Labrum, MD [facility] [address] [place], [place] [address] [phone] (office)

## 2021-08-29 NOTE — TOC Initial Note (Signed)
Transition of Care [facility]) - Initial/Assessment Note    Patient Details  Name: Wesley [name] MRN: [id number] Date of Birth: January 07, 1946  [facility] CM/SW Contact:    Boneta Lucks, RN Phone Number: 08/29/2021, 1:01 PM  Clinical Narrative:   Patient scheduled for colostomy. Palliative is recommending SNF, PT eval in pending. Patient and family agreeable. PASSR completed. TOC will send FL2 out for bed offers to review with Lattie Haw, daughter.  She request he stay local.  He has been vaccinated for COVID.                 Expected Discharge Plan: Skilled Nursing Facility Barriers to Discharge: Continued Medical Work up  Patient Goals and CMS Choice Patient states their goals for this hospitalization and ongoing recovery are:: agreeable to SNF CMS Medicare.gov Compare Post Acute Care list provided to:: Patient   Expected Discharge Plan and Services Expected Discharge Plan: [place]     Prior Living Arrangements/Services   Lives with:: Self Patient language and need for interpreter reviewed:: Yes        Need for Family Participation in Patient Care: Yes (Comment) Care giver support system in place?: Yes (comment)   Criminal Activity/Legal Involvement Pertinent to Current Situation/Hospitalization: No - Comment as needed  Activities of Daily Living Home Assistive Devices/Equipment: None ADL Screening (condition at time of admission) Patient's cognitive ability adequate to safely complete daily activities?: Yes Is the patient deaf or have difficulty hearing?: Yes Does the patient have difficulty seeing, even when wearing glasses/contacts?: No Does the patient have difficulty concentrating, remembering, or making decisions?: No Patient able to express need for assistance with ADLs?: Yes Does the patient have difficulty dressing or bathing?: No Independently performs ADLs?: Yes (appropriate for developmental age) Does the patient have difficulty  walking or climbing stairs?: No Weakness of Legs: None Weakness of Arms/Hands: None  Permission Sought/Granted      Share Information with NAME: Lattie Haw     Permission granted to share info w Relationship: Daughter    Emotional Assessment      Orientation: : Oriented to Self, Oriented to Place, Oriented to  Time, Oriented to Situation Alcohol / Substance Use: Not Applicable Psych Involvement: No (comment)  Admission diagnosis:  Acute ischemic colitis ([place]) [K55.039] Ischemic colitis ([place]) [K55.9] Patient Active Problem List   Diagnosis Date Noted   Ischemic colitis ([place])    Acute ischemic colitis ([place]) 08/25/2021   Rectal adenocarcinoma ([place]) 11/07/2017   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 [place] [address] Phone: [phone] Fax: [phone]   Readmission Risk Interventions Readmission Risk Prevention Plan 08/29/2021  Medication Screening Complete  Transportation Screening Complete  Some recent data might be hidden

## 2021-08-29 NOTE — Progress Notes (Signed)
[facility]  Discussed plan with patient and family, daughter Lattie Haw. In agreement for palliative end colostomy and colectomy.  Curlene Labrum, MD [facility] [address] [place], [place] [address] [phone] (office)

## 2021-08-29 NOTE — Anesthesia Procedure Notes (Signed)
Procedure Name: Intubation Date/Time: 08/29/2021 1:24 PM Performed by: Myna Bright, CRNA Pre-anesthesia Checklist: Patient identified, Emergency Drugs available, Patient being monitored and Suction available Patient Re-evaluated:Patient Re-evaluated prior to induction Oxygen Delivery Method: Circle system utilized Preoxygenation: Pre-oxygenation with 100% oxygen Induction Type: IV induction Ventilation: Mask ventilation without difficulty Laryngoscope Size: Mac and 4 Grade View: Grade I Tube type: Oral Tube size: 7.5 mm Number of attempts: 1 Airway Equipment and Method: Stylet Placement Confirmation: ETT inserted through vocal cords under direct vision, positive ETCO2 and breath sounds checked- equal and bilateral Secured at: 22 cm Tube secured with: Tape Dental Injury: Teeth and Oropharynx as per pre-operative assessment

## 2021-08-29 NOTE — Progress Notes (Signed)
Pt transported to OR holding via stretcher. Daughters at bedside.

## 2021-08-29 NOTE — Op Note (Signed)
[facility] Operative Note  08/29/21  Preoperative Diagnosis: Radiation/ Ischemic colitis, h/o rectal cancer treated with radiation    Postoperative Diagnosis: Same   Procedure(s) Performed: Palliative Colectomy with end colostomy (Hartman's Procedure)    Surgeon: Lanell Matar. Constance Haw, MD   Assistants: Aviva Signs, MD    Anesthesia: General endotracheal   Anesthesiologist: Denese Killings, MD    Specimens: Rectum (distal suture)   Estimated Blood Loss: Minimal   Blood Replacement: None    Complications: None   Wound Class: Clean contaminated    Operative Indications: Wesley [name] is a 75 yo with a history of rectal cancer s/p radiation treatment but opted out of surgery. He comes in with pain and chronic diarrhea and changes on CT concerning for radiation versus ischemic colitis, flexible sigmoidoscopy demonstrated area of necrosis and narrowing. I discussed with him and his family that the heparin gtt and bowel rest was not helping and we would have to proceed with colectomy and end colostomy for pain and palliation. He had been marked by the Ostomy RN prior to the procedure for colostomy.   Findings: Ischemic changes to the rectum, no signs of perforation    Procedure: The patient was taken to the operating room and placed supine. General endotracheal anesthesia was induced. Intravenous antibiotics were  administered per protocol.  An orogastric tube positioned to decompress the stomach. The abdomen was prepared and draped in the usual sterile fashion.   A lower midline incision was made and carried down through to the fascia. The peritoneum was entered with care. A small serosal injury on a loop of small bowel was oversewed with a 3-0 Lembert silk. The small bowel was packed into the right upper quadrant. He was placed in Trendelenburg. The rectum was noted to have signs of ischemia but no signs of perforation. The descending colon was mobilized on the  white line of Toldt. The left ureter was identified and protected. The proximal point of transection was identified and taken with a 85mm linear cutting stapler. The mesentery was scored an taken with a Ligasure. The distal end past the point of ischemia was taken with a contour 60 TA stapler. The remaining mesentery was taken with the Ligasure. This was packed off.  The distal part of the rectum/ colon was marked with a silk suture.   The proximal end was mobilized further and the mesentery was scored. There was some tethering and the Ligasure as used to attempt to bring the mesentery up. The packing from the pelvis was removed. There was some minor bleeding from the sacrum in the pelvis. Gel foam and surgicel was packed down there.   The marked location of the ostomy was cut and carried down to the fascia, a cruciate incision was made. Two fingers were passed through the fascia and muscle.  The end colostomy was brought out through the defect without twisting.   The midline was closed in the standard fashion with 0 PDS, and Exparel was injected. The incision was irrigated. Staples were placed and a honeycomb dressing which was covered.   The colostomy was then trimmed pack to healthy viable tissue and was under no tension. It was matured with 2-0 Chromic gut. An ostomy wafer and bag were placed.   Dr. Arnoldo Morale was assisting throughout the procedure and was present for the critical portions of the case.   Final inspection revealed acceptable hemostasis. All counts were correct at the end of the case. The patient was awakened from  anesthesia and extubated without complication.  The patient went to the PACU in stable condition.   Wesley Labrum, MD [facility] [address] [place], [place] [address] [phone] (office)

## 2021-08-29 NOTE — Transfer of Care (Signed)
Immediate Anesthesia Transfer of Care Note  Patient: Wesley [name]  Procedure(s) Performed: COLECTOMY WITH COLOSTOMY CREATION/HARTMANN PROCEDURE (Abdomen)  Patient Location: PACU  Anesthesia Type:General  Level of Consciousness: awake and alert   Airway & Oxygen Therapy: Patient Spontanous Breathing  Post-op Assessment: Report given to RN  Post vital signs: Reviewed and stable  Last Vitals:  Vitals Value Taken Time  BP 112/95 08/29/21 1500  Temp    Pulse 78 08/29/21 1506  Resp 3 08/29/21 1506  SpO2 95 % 08/29/21 1506  Vitals shown include unvalidated device data.  Last Pain:  Vitals:   08/29/21 1239  TempSrc: Oral  PainSc:       Patients Stated Pain Goal: 3 (60/45/40 9811)  Complications: No notable events documented.

## 2021-08-29 NOTE — NC FL2 (Signed)
[place] LEVEL OF CARE SCREENING TOOL     IDENTIFICATION  Patient Name: Wesley [name] Birthdate: Apr 16, 1946 Sex: male Admission Date (Current Location): 08/24/2021  [facility] and [place] Number:  [employer] and Address:  [facility] [address], [place]      Provider Number: [id number]  Attending Physician Name and Address:  Murlean Iba, MD  Relative Name and Phone Number:       Current Level of Care: Hospital Recommended Level of Care: [place] Prior Approval Number:    Date Approved/Denied:   PASRR Number: [id number]  Discharge Plan: SNF    Current Diagnoses: Patient Active Problem List   Diagnosis Date Noted   Ischemic colitis [facility])    Acute ischemic colitis ([place]) 08/25/2021   Rectal adenocarcinoma ([place]) 11/07/2017   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   Hyperlipidemia 11/16/2014   Tobacco use 11/16/2014   Insomnia 11/16/2014    Orientation RESPIRATION BLADDER Height & Weight     Self, Time, Situation, Place  Normal External catheter Weight: 60 kg Height:  5\' 6"  (167.6 cm)  BEHAVIORAL SYMPTOMS/MOOD NEUROLOGICAL BOWEL NUTRITION STATUS      Continent Diet (See DC summary)  AMBULATORY STATUS COMMUNICATION OF NEEDS Skin   Limited Assist Verbally Surgical wounds                       Personal Care Assistance Level of Assistance  Bathing, Feeding, Dressing Bathing Assistance: Maximum assistance Feeding assistance: Limited assistance Dressing Assistance: Limited assistance     Functional Limitations Info  Sight, Hearing, Speech Sight Info: Adequate Hearing Info: Impaired Speech Info: Adequate    SPECIAL CARE FACTORS FREQUENCY  PT (By licensed PT)     PT Frequency: 5 times a week              Contractures Contractures Info: Not present    Additional Factors Info  Code Status, Allergies Code Status Info: DNR Allergies Info: NKDA            Current Medications (08/29/2021):  This is the current hospital active medication list Current Facility-Administered Medications  Medication Dose Route Frequency Provider Last Rate Last Admin   [MAR Hold] acetaminophen (TYLENOL) tablet 650 mg  650 mg Oral Q6H PRN Daneil Dolin, MD       Or   Doug Sou Hold] acetaminophen (TYLENOL) suppository 650 mg  650 mg Rectal Q6H PRN Rourk, Cristopher Estimable, MD       [MAR Hold] ALPRAZolam Duanne Moron) tablet 0.5 mg  0.5 mg Oral QHS PRN Daneil Dolin, MD   0.5 mg at 08/28/21 2349   cefoTEtan (CEFOTAN) 2 g in sodium chloride 0.9 % 100 mL IVPB  2 g Intravenous On Call to [place], MD       [facility] Hold] Chlorhexidine Gluconate Cloth 2 % PADS 6 each  6 each Topical Daily Daneil Dolin, MD   6 each at 08/29/21 1120   dextrose 5 %-0.9 % sodium chloride infusion   Intravenous Continuous Daneil Dolin, MD 75 mL/hr at 08/28/21 0304 New Bag at 08/28/21 0304   [MAR Hold] HYDROmorphone (DILAUDID) injection 0.5-1 mg  0.5-1 mg Intravenous Q2H PRN Daneil Dolin, MD   0.5 mg at 08/28/21 1447   [START ON 08/30/2021] LORazepam (ATIVAN) tablet 1 mg  1 mg Oral QHS Dove, Tasha A, NP       [MAR Hold] ondansetron (ZOFRAN) tablet  4 mg  4 mg Oral Q6H PRN Daneil Dolin, MD       Or   Doug Sou Hold] ondansetron [facility]) injection 4 mg  4 mg Intravenous Q6H PRN Daneil Dolin, MD   4 mg at 08/28/21 1920   [MAR Hold] piperacillin-tazobactam (ZOSYN) IVPB 3.375 g  3.375 g Intravenous Q8H Daneil Dolin, MD 12.5 mL/hr at 08/29/21 1219 3.375 g at 08/29/21 1219   [MAR Hold] sertraline (ZOLOFT) tablet 25 mg  25 mg Oral Daily Daneil Dolin, MD   25 mg at 08/28/21 0815   sodium chloride irrigation 0.9 %    PRN Virl Cagey, MD   1,000 mL at 08/29/21 1302     Discharge Medications: Please see discharge summary for a list of discharge medications.  Relevant Imaging Results:  Relevant Lab Results:   Additional Information SS# [id number]  Boneta Lucks, RN

## 2021-08-30 ENCOUNTER — Encounter (HOSPITAL_COMMUNITY): Payer: Self-pay | Admitting: General Surgery

## 2021-08-30 ENCOUNTER — Other Ambulatory Visit: Payer: Self-pay | Admitting: Family Medicine

## 2021-08-30 DIAGNOSIS — Z7189 Other specified counseling: Secondary | ICD-10-CM

## 2021-08-30 DIAGNOSIS — K55039 Acute (reversible) ischemia of large intestine, extent unspecified: Secondary | ICD-10-CM | POA: Diagnosis not present

## 2021-08-30 DIAGNOSIS — Z515 Encounter for palliative care: Secondary | ICD-10-CM | POA: Diagnosis not present

## 2021-08-30 DIAGNOSIS — F0631 Mood disorder due to known physiological condition with depressive features: Secondary | ICD-10-CM | POA: Diagnosis not present

## 2021-08-30 LAB — CBC
HCT: 36.7 % — ABNORMAL LOW (ref 39.0–52.0)
Hemoglobin: 12.2 g/dL — ABNORMAL LOW (ref 13.0–17.0)
MCH: 30 pg (ref 26.0–34.0)
MCHC: 33.2 g/dL (ref 30.0–36.0)
MCV: 90.4 fL (ref 80.0–100.0)
Platelets: 236 10*3/uL (ref 150–400)
RBC: 4.06 MIL/uL — ABNORMAL LOW (ref 4.22–5.81)
RDW: 13.6 % (ref 11.5–15.5)
WBC: 11.4 10*3/uL — ABNORMAL HIGH (ref 4.0–10.5)
nRBC: 0 % (ref 0.0–0.2)

## 2021-08-30 LAB — GLUCOSE, CAPILLARY
Glucose-Capillary: 115 mg/dL — ABNORMAL HIGH (ref 70–99)
Glucose-Capillary: 122 mg/dL — ABNORMAL HIGH (ref 70–99)
Glucose-Capillary: 143 mg/dL — ABNORMAL HIGH (ref 70–99)
Glucose-Capillary: 156 mg/dL — ABNORMAL HIGH (ref 70–99)

## 2021-08-30 LAB — COMPREHENSIVE METABOLIC PANEL
ALT: 19 U/L (ref 0–44)
AST: 22 U/L (ref 15–41)
Albumin: 2.5 g/dL — ABNORMAL LOW (ref 3.5–5.0)
Alkaline Phosphatase: 59 U/L (ref 38–126)
Anion gap: 9 (ref 5–15)
BUN: 5 mg/dL — ABNORMAL LOW (ref 8–23)
CO2: 25 mmol/L (ref 22–32)
Calcium: 8.3 mg/dL — ABNORMAL LOW (ref 8.9–10.3)
Chloride: 103 mmol/L (ref 98–111)
Creatinine, Ser: 0.66 mg/dL (ref 0.61–1.24)
GFR, Estimated: >60 mL/min (ref >60)
Glucose, Bld: 117 mg/dL — ABNORMAL HIGH (ref 70–99)
Potassium: 3.6 mmol/L (ref 3.5–5.1)
Sodium: 137 mmol/L (ref 135–145)
Total Bilirubin: 0.6 mg/dL (ref 0.3–1.2)
Total Protein: 5.7 g/dL — ABNORMAL LOW (ref 6.5–8.1)

## 2021-08-30 LAB — MAGNESIUM: Magnesium: 1.8 mg/dL (ref 1.7–2.4)

## 2021-08-30 MED ORDER — PIPERACILLIN-TAZOBACTAM 3.375 G IVPB
3.3750 g | Freq: Three times a day (TID) | INTRAVENOUS | Status: AC
  Administered 2021-08-30 (×2): 3.375 g via INTRAVENOUS
  Filled 2021-08-30 (×2): qty 50

## 2021-08-30 NOTE — Progress Notes (Signed)
PROGRESS NOTE    Wesley [name]  KYH:[id number] DOB: 1946/01/19 DOA: 08/24/2021 PCP: Jearld Fenton, NP    Brief Narrative:  Wesley [name], 75 yom, presented 08/24/21 with abdominal pain and bloody diarrhea. Hx of rectal adenocarcinoma, depression, gerd, tobacco use, hyperlipidemia. He reported lower abdominal pain with a bowel movement, reports blood in the stool, dark red in color. CT abd pelvis w contrast showed concern for IMA stenosis and an ischemic portion of sigmoid colon. Flexible sigmoidoscopy on 08/27/21 also shared impression of sigmoid colon ischemia, biopsies showed necrotic tissue. Proctocolectomy with colostomy was conducted 9/28. Today he appears more well, is eating, which previously he avoided any ingestion due to postprandial pain, and ostomy education is planned.  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 -colectomy 09/28 with colostomy placement -reports improved abdominal pain -ostomy education planned  Active Problems:  Anxiety and Depression -zoloft 25mg  qd -alprazolam 0.5mg po qhs prn -appears more positive with improved bowel pain symptoms  Hx rectal adenocarcinoma -previous radiation -colectomy 90/28/2022  Leukocytosis -09/29 0500 labs 11.4 wbc, - post colectomy 09/28 -will monitor     DVT prophylaxis: scd's Code Status: dnr Family Communication: sister and daughter present Disposition Plan: d/c to snf in 2-3 days post colostomy   Consultants:  General surgery Gastroenterology Psychiatry Palliative care  Procedures:  Flexible Sigmoidoscopy  Colectomy and colostomy  Antimicrobials: Zosyn 3.375g iv q8hrs Cefotetan 2g iv, pre-op   Subjective: Pt seated in bed, eating breakfast, pain is improved but still present, fresh colostomy and colectomy, but post prandial pain is much improved.  Objective: Vitals:   08/29/21 1812 08/29/21  2200 08/30/21 0215 08/30/21 0613  BP: (!) 114/59 114/62 (!) 120/59 (!) 121/58  Pulse: 74 75 71 77  Resp: 16 18 18 18   Temp: 97.9 F (36.6 C) 98.1 F (36.7 C) 98.3 F (36.8 C) 98.3 F (36.8 C)  TempSrc: Oral     SpO2: 97% 99% 99% 99%  Weight:      Height:        Intake/Output Summary (Last 24 hours) at 08/30/2021 1246 Last data filed at 08/30/2021 0900 Gross per 24 hour  Intake 1835.95 ml  Output 2950 ml  Net -1114.05 ml   Filed Weights   08/24/21 2043 08/27/21 1504  Weight: 60 kg 60 kg    Examination:  General exam: Appears calm and more comfortable   Respiratory system: Clear to auscultation. Respiratory effort normal.  Cardiovascular system: S1 & S2 heard, RRR. No JVD, murmurs, rubs, gallops or clicks. No pedal edema.  Gastrointestinal system: Abdomen is nondistended, soft and mildly tender, some guarding, no tympany or taughtness.  Central nervous system: Alert and oriented. No focal neurological deficits.  Extremities: moves all extremities appropriately  Skin: No obvious rashes or lesions  Psychiatry: Judgement and insight appear normal. Mood & affect appropriate and improved    Data Reviewed: I have personally reviewed following labs and imaging studies  CBC: Recent Labs  Lab 08/26/21 0118 08/27/21 0602 08/28/21 0601 08/29/21 0547 08/30/21 0558  WBC 12.4* 10.7* 7.8 8.2 11.4*  HGB 12.9* 11.4* 11.5* 12.7* 12.2*  HCT 38.3* 35.0* 33.2* 37.2* 36.7*  MCV 92.1 92.1 88.3 89.0 90.4  PLT 177 170 185 236 151   Basic Metabolic Panel: Recent Labs  Lab 08/26/21 0118 08/27/21 0602 08/28/21 0601 08/29/21 0547 08/30/21 0558  NA 131* 133* 134* 137 137  K 3.5 3.5 3.3* 3.8 3.6  CL 102 106 105 107 103  CO2 22 16* 23 24 25   GLUCOSE 82 64* 110* 117* 117*  BUN 12 10 5* <5* 5*  CREATININE 0.79 0.81 0.70 0.64 0.66  CALCIUM 7.7* 7.7* 7.8* 8.5* 8.3*  MG 1.8 1.8 1.7 1.9 1.8   GFR: Estimated Creatinine Clearance: 67.7 mL/min (by C-G formula based on SCr of 0.66  mg/dL). Liver Function Tests: Recent Labs  Lab 08/25/21 0100 08/26/21 0118 08/27/21 0602 08/30/21 0558  AST 19 18 18 22   ALT 13 12 12 19   ALKPHOS 63 55 54 59  BILITOT 1.1 1.1 1.1 0.6  PROT 7.2 6.1* 5.3* 5.7*  ALBUMIN 3.8 3.0* 2.5* 2.5*   Recent Labs  Lab 08/25/21 0100  LIPASE 21   No results for input(s): AMMONIA in the last 168 hours. Coagulation Profile: No results for input(s): INR, PROTIME in the last 168 hours. Cardiac Enzymes: No results for input(s): CKTOTAL, CKMB, CKMBINDEX, TROPONINI in the last 168 hours. BNP (last 3 results) No results for input(s): PROBNP in the last 8760 hours. HbA1C: No results for input(s): HGBA1C in the last 72 hours. CBG: Recent Labs  Lab 08/29/21 0410 08/29/21 1130 08/30/21 0022 08/30/21 0609 08/30/21 1110  GLUCAP 141* 118* 115* 122* 143*   Lipid Profile: No results for input(s): CHOL, HDL, LDLCALC, TRIG, CHOLHDL, LDLDIRECT in the last 72 hours. Thyroid Function Tests: No results for input(s): TSH, T4TOTAL, FREET4, T3FREE, THYROIDAB in the last 72 hours. Anemia Panel: No results for input(s): VITAMINB12, FOLATE, FERRITIN, TIBC, IRON, RETICCTPCT in the last 72 hours. Urine analysis:    Component Value Date/Time   COLORURINE YELLOW 08/25/2021 0500   APPEARANCEUR CLEAR 08/25/2021 0500   LABSPEC <1.005 (L) 08/25/2021 0500   PHURINE 5.0 08/25/2021 0500   GLUCOSEU NEGATIVE 08/25/2021 0500   HGBUR SMALL (A) 08/25/2021 0500   BILIRUBINUR NEGATIVE 08/25/2021 0500   KETONESUR NEGATIVE 08/25/2021 0500   PROTEINUR NEGATIVE 08/25/2021 0500   NITRITE NEGATIVE 08/25/2021 0500   LEUKOCYTESUR NEGATIVE 08/25/2021 0500   Sepsis Labs: @LABRCNTIP (procalcitonin:4,lacticidven:4)  ) Recent Results (from the past 240 hour(s))  Resp Panel by RT-PCR (Flu A&B, Covid) Nasopharyngeal Swab     Status: None   Collection Time: 08/25/21  6:06 AM   Specimen: Nasopharyngeal Swab; Nasopharyngeal(NP) swabs in vial transport medium  Result Value Ref  Range Status   SARS Coronavirus 2 by RT PCR NEGATIVE NEGATIVE Final    Comment: (NOTE) SARS-CoV-2 target nucleic acids are NOT DETECTED.  The SARS-CoV-2 RNA is generally detectable in upper respiratory specimens during the acute phase of infection. The lowest concentration of SARS-CoV-2 viral copies this assay can detect is 138 copies/mL. A negative result does not preclude SARS-Cov-2 infection and should not be used as the sole basis for treatment or other patient management decisions. A negative result may occur with  improper specimen collection/handling, submission of specimen other than nasopharyngeal swab, presence of viral mutation(s) within the areas targeted by this assay, and inadequate number of viral copies(<138 copies/mL). A negative result must be combined with clinical observations, patient history, and epidemiological information. The expected result is Negative.  Fact Sheet for Patients:  EntrepreneurPulse.com.au  Fact Sheet for Healthcare Providers:  IncredibleEmployment.be  This test is no t yet approved or cleared by the [place] FDA and  has been authorized for detection and/or diagnosis of SARS-CoV-2 by FDA under an Emergency Use Authorization (EUA). This EUA will remain  in effect (meaning this test can be used) for the duration of the COVID-19  declaration under Section 564(b)(1) of the Act, 21 U.S.C.section 360bbb-3(b)(1), unless the authorization is terminated  or revoked sooner.       Influenza A by PCR NEGATIVE NEGATIVE Final   Influenza B by PCR NEGATIVE NEGATIVE Final    Comment: (NOTE) The Xpert Xpress SARS-CoV-2/FLU/RSV plus assay is intended as an aid in the diagnosis of influenza from Nasopharyngeal swab specimens and should not be used as a sole basis for treatment. Nasal washings and aspirates are unacceptable for Xpert Xpress SARS-CoV-2/FLU/RSV testing.  Fact Sheet for  Patients: EntrepreneurPulse.com.au  Fact Sheet for Healthcare Providers: IncredibleEmployment.be  This test is not yet approved or cleared by the [place] FDA and has been authorized for detection and/or diagnosis of SARS-CoV-2 by FDA under an Emergency Use Authorization (EUA). This EUA will remain in effect (meaning this test can be used) for the duration of the COVID-19 declaration under Section 564(b)(1) of the Act, 21 U.S.C. section 360bbb-3(b)(1), unless the authorization is terminated or revoked.  Performed at [facility], [address] [place], [place] [address]   Surgical pcr screen     Status: None   Collection Time: 08/28/21  1:26 PM   Specimen: Nasal Mucosa; Nasal Swab  Result Value Ref Range Status   MRSA, PCR NEGATIVE NEGATIVE Final   Staphylococcus aureus NEGATIVE NEGATIVE Final    Comment: (NOTE) The Xpert SA Assay (FDA approved for NASAL specimens in patients 24 years of age and older), is one component of a comprehensive surveillance program. It is not intended to diagnose infection nor to guide or monitor treatment. Performed at [facility], [address] [place], [place] [address]          Scheduled Meds:  chlorhexidine  15 mL Mouth/Throat Once   Or   mouth rinse  15 mL Mouth Rinse Once   Chlorhexidine Gluconate Cloth  6 each Topical Daily   LORazepam  1 mg Oral QHS   sertraline  25 mg Oral Daily   Continuous Infusions:  dextrose 5 % and 0.9% NaCl 75 mL/hr at 08/28/21 0304   lactated ringers 50 mL/hr at 08/29/21 2312   piperacillin-tazobactam (ZOSYN)  IV 3.375 g (08/30/21 1134)     LOS: 5 days        Ellie Lunch, student [facility] Pager [phone]  If 7PM-7AM, please contact night-coverage [email or web address] Password [place] 08/30/2021, 12:46 PM

## 2021-08-30 NOTE — Progress Notes (Signed)
Patients pain well controlled with PO analgesics for breakthrough pain as well as IV dilaudid for acute pain. Patient appetite fair today . Surgical incision intact with no drainage noted. Colostomy in place no s/s of infection . POD 1 . Patient able to make needs known , however he will not ask for pain medication , so rounding 2 hours to make sure patient is comfortable. Patient had 1 episode of nausea which subsided with PRN zofran IV . Patient appears comfortable at this time. Call bell and hydration within reach.

## 2021-08-30 NOTE — Progress Notes (Signed)
1 Day Post-Op  Subjective: Has mild incisional pain.  Denies any nausea or vomiting.  Objective: Vital [name] in last 24 hours: Temp:  [97.9 F (36.6 C)-98.7 F (37.1 C)] 98.3 F (36.8 C) (09/29 9163) Pulse Rate:  [66-78] 77 (09/29 0613) Resp:  [11-23] 18 (09/29 8466) BP: (93-121)/(46-95) 121/58 (09/29 0613) SpO2:  [96 %-100 %] 99 % (09/29 0613) Last BM Date: 08/28/21  Intake/Output from previous day: 09/28 0701 - 09/29 0700 In: 1656 [I.V.:1346.7; IV Piggyback:309.3] Out: 2950 [Urine:2925; Blood:25] Intake/Output this shift: No intake/output data recorded.  General appearance: alert, cooperative, and no distress GI: Soft, flat.  Incision healing well.  Colostomy pink, minimal fluid in ostomy bag.  Minimal bowel sounds appreciated.  Lab Results:  Recent Labs    08/29/21 0547 08/30/21 0558  WBC 8.2 11.4*  HGB 12.7* 12.2*  HCT 37.2* 36.7*  PLT 236 236   BMET Recent Labs    08/29/21 0547 08/30/21 0558  NA 137 137  K 3.8 3.6  CL 107 103  CO2 24 25  GLUCOSE 117* 117*  BUN <5* 5*  CREATININE 0.64 0.66  CALCIUM 8.5* 8.3*   PT/INR No results for input(s): LABPROT, INR in the last 72 hours.  Studies/Results: No results found.  Anti-infectives: Anti-infectives (From admission, onward)    Start     Dose/Rate Route Frequency Ordered Stop   08/29/21 0600  cefoTEtan (CEFOTAN) 2 g in sodium chloride 0.9 % 100 mL IVPB        2 g 200 mL/hr over 30 Minutes Intravenous On call to O.R. 08/28/21 1306 08/29/21 1341   08/25/21 1200  piperacillin-tazobactam (ZOSYN) IVPB 3.375 g        3.375 g 12.5 mL/hr over 240 Minutes Intravenous Every 8 hours 08/25/21 0813     08/25/21 0430  piperacillin-tazobactam (ZOSYN) IVPB 3.375 g        3.375 g 100 mL/hr over 30 Minutes Intravenous  Once 08/25/21 0427 08/25/21 0604       Assessment/Plan: s/p Procedure(s): COLECTOMY WITH COLOSTOMY CREATION/HARTMANN PROCEDURE Impression: Stable on postoperative day 1.  Patient is on a heart  healthy diet.  SNF transition pending.  Would keep staples in for approximately 2 weeks.  They can be removed either in our office or at the skilled nursing unit.  LOS: 5 days    Wesley [name] 08/30/2021

## 2021-08-30 NOTE — Consult Note (Signed)
[place] Nurse ostomy follow up Stoma type/location: LUQ, end stoma Stomal assessment/size: 1 1/8" round, budded, pink, os at center Peristomal assessment: using 2" skin barrier; stoma is only slightly budded  Treatment options for stomal/peristomal skin: 2" skin barrier Output bloody Ostomy pouching: 2pc 2 3/4" with 2" skin barrier  Education provided:   Patient is eating when I arrive with washcloth on his forehead Any time I touch him he is grimacing and appears to be in pain, but he endorses it is not pain but more irritation. Not sure about pain level but just to touch tape border of ostomy skin barrier causes patient to flinch. His sister is at the bedside and reports she knows " he is going to say everything hurts him". She is engaged to learn care.  He seems a little hesitant to the idea when I mention short term rehab. Sister does not live with patient but she is very thorough per patient's daughters and they feel like if she learns she can help them to learn.  He does not participate at all with teaching, does not open eyes. Sister does turn away initially but then engaged in teaching session.  Education essentially with sister Charity fundraiser) Explained role of ostomy nurse and creation of stoma  Explained stoma characteristics (budded, flush, color, texture, care) Demonstrated pouch change (cutting new skin barrier, measuring stoma, cleaning peristomal skin and stoma, use of barrier ring) Allowed sister to cut new skin barrier for next pouch change  Education on emptying when 1/3 to 1/2 full and how to empty Demonstrated "burping" flatus from pouch Provided patient with [employer] and [employer]. Explained billing minimally to her. Answered patient/family questions:   She is concerned about supplies, seems to understand this process now Provided [place] contact information Provided ostomy clinic information, requested referral to clinic from surgery [place]  nurse possible to be able to follow up Monday, limited staffing tomorrow and Monday   Enrolled patient in [place] Start Discharge program: Yes [place] Nurse will follow along with you for continued support with ostomy teaching and care [place] MSN, [place], [place], [place], [place]

## 2021-08-30 NOTE — Progress Notes (Signed)
Palliative:  [name], Wesley [name], is lying quietly in bed with bedside nursing staff attending to personal needs. There is no family at bedside at this time. Wesley [name] is post op day one and is having nausea.  Limited discussions today.    PT evaluation likely tomorrow.  Goals set for out patient palliative, STR if qualified.    Conference with attending, bedside nursing staff, PT and TOC related to patient condition, needs, GOC.   Plan:  Continue to treat the treatable, but no CPR or intubation.  STR if qualified.  Out patient palliative services.   15 minutes  Quinn Axe, NP Palliative Medicine Team  Team Phone [phone] Greater than 50% of ths time was spent counseling and coordinating care related to the above assessment plan.

## 2021-08-31 DIAGNOSIS — F0631 Mood disorder due to known physiological condition with depressive features: Secondary | ICD-10-CM | POA: Diagnosis not present

## 2021-08-31 DIAGNOSIS — K559 Vascular disorder of intestine, unspecified: Secondary | ICD-10-CM | POA: Diagnosis not present

## 2021-08-31 DIAGNOSIS — K55039 Acute (reversible) ischemia of large intestine, extent unspecified: Secondary | ICD-10-CM | POA: Diagnosis not present

## 2021-08-31 LAB — CBC
HCT: 35.4 % — ABNORMAL LOW (ref 39.0–52.0)
Hemoglobin: 11.9 g/dL — ABNORMAL LOW (ref 13.0–17.0)
MCH: 30.7 pg (ref 26.0–34.0)
MCHC: 33.6 g/dL (ref 30.0–36.0)
MCV: 91.5 fL (ref 80.0–100.0)
Platelets: 260 10*3/uL (ref 150–400)
RBC: 3.87 MIL/uL — ABNORMAL LOW (ref 4.22–5.81)
RDW: 13.9 % (ref 11.5–15.5)
WBC: 9.3 10*3/uL (ref 4.0–10.5)
nRBC: 0 % (ref 0.0–0.2)

## 2021-08-31 LAB — GLUCOSE, CAPILLARY
Glucose-Capillary: 108 mg/dL — ABNORMAL HIGH (ref 70–99)
Glucose-Capillary: 111 mg/dL — ABNORMAL HIGH (ref 70–99)
Glucose-Capillary: 127 mg/dL — ABNORMAL HIGH (ref 70–99)

## 2021-08-31 LAB — COMPREHENSIVE METABOLIC PANEL
ALT: 20 U/L (ref 0–44)
AST: 25 U/L (ref 15–41)
Albumin: 2.6 g/dL — ABNORMAL LOW (ref 3.5–5.0)
Alkaline Phosphatase: 70 U/L (ref 38–126)
Anion gap: 7 (ref 5–15)
BUN: 9 mg/dL (ref 8–23)
CO2: 29 mmol/L (ref 22–32)
Calcium: 8.5 mg/dL — ABNORMAL LOW (ref 8.9–10.3)
Chloride: 100 mmol/L (ref 98–111)
Creatinine, Ser: 0.77 mg/dL (ref 0.61–1.24)
GFR, Estimated: >60 mL/min (ref >60)
Glucose, Bld: 110 mg/dL — ABNORMAL HIGH (ref 70–99)
Potassium: 4.2 mmol/L (ref 3.5–5.1)
Sodium: 136 mmol/L (ref 135–145)
Total Bilirubin: 0.3 mg/dL (ref 0.3–1.2)
Total Protein: 6 g/dL — ABNORMAL LOW (ref 6.5–8.1)

## 2021-08-31 LAB — SURGICAL PATHOLOGY

## 2021-08-31 LAB — MAGNESIUM: Magnesium: 1.8 mg/dL (ref 1.7–2.4)

## 2021-08-31 MED ORDER — POLYETHYLENE GLYCOL 3350 17 G PO PACK
17.0000 g | PACK | Freq: Two times a day (BID) | ORAL | Status: DC
  Administered 2021-08-31 – 2021-09-04 (×10): 17 g via ORAL
  Filled 2021-08-31 (×11): qty 1

## 2021-08-31 NOTE — Progress Notes (Signed)
PROGRESS NOTE    Wesley [name]  PNT:[id number] DOB: [birth year] DOA: 08/24/2021 PCP: Jearld Fenton, NP     Brief Narrative: Wesley [name], 75 yom, presented 08/24/21 with abdominal pain and bloody diarrhea. Hx of rectal adenocarcinoma, depression, gerd, tobacco use, hyperlipidemia. He reported lower abdominal pain with a bowel movement, reports blood in the stool, dark red in color. CT abd pelvis w contrast showed concern for IMA stenosis and an ischemic portion of sigmoid colon. Flexible sigmoidoscopy on 08/27/21 also shared impression of sigmoid colon ischemia, biopsies showed necrotic tissue. Proctocolectomy with colostomy was conducted 9/28. Today he appears more well, is eating, which previously he avoided any ingestion due to postprandial pain, and ostomy was provided. Discharge to snf is anticipated. Sugery has seen the patient today, starting Mr Loeber on miralax to encourage bowel movement.  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 -colectomy 09/28 with colostomy placement -reports improved abdominal pain -ostomy education provided   Active Problems:   Anxiety and Depression -zoloft 25mg  qd -alprazolam 0.5mg po qhs prn -appears more positive with improved bowel pain symptoms   Hx rectal adenocarcinoma -previous radiation -colectomy 90/28/2022     DVT prophylaxis: scd Code Status: dnr Family Communication: sister at bedside Disposition Plan: anticipate snf on monday  Consultants:  General surgery Gastroenterology Psychiatry Palliative care  Procedures:  Flexible sigmoidoscopy Colectomy colostomy   Antimicrobials: Cefotetan pre-op Zosyn 3.375g q8hrs 09/24-09/30   Subjective: He reports less pain, reports less lower abd pain, does have stoma pain, the stoma is pink and well perfused  Objective: Vitals:   08/30/21 0613 08/30/21 1327 08/30/21 2204  08/31/21 0618  BP: (!) 121/58 (!) 97/55 (!) 102/58 112/60  Pulse: 77 79 80 77  Resp: 18 17  18   Temp: 98.3 F (36.8 C) 98.4 F (36.9 C) 98.6 F (37 C) 98.1 F (36.7 C)  TempSrc:  Oral Oral Oral  SpO2: 99% 91% 94% 96%  Weight:      Height:        Intake/Output Summary (Last 24 hours) at 08/31/2021 1309 Last data filed at 08/31/2021 0900 Gross per 24 hour  Intake 1275.54 ml  Output --  Net 1275.54 ml   Filed Weights   08/24/21 2043 08/27/21 1504  Weight: 60 kg 60 kg    Examination:  General exam: Appears calm and more comfortable, eating solids  Respiratory system: Clear to auscultation. Respiratory effort normal.  Cardiovascular system: S1 & S2 heard, RRR. No JVD, murmurs, rubs, gallops or clicks. No pedal edema.  Gastrointestinal system: Abdomen is nondistended, soft and nontender. No organomegaly or masses felt. Normal bowel sounds heard. Reports tenderness near incision sites, non erythematous, no drainage  Central nervous system: Alert and oriented. No focal neurological deficits.  Extremities: moves extremities appropriately  Skin: No obvious rashes, lesions, or ulcers  Psychiatry: Judgement and insight appear normal. Mood & affect are improving, still slightly uncomfortable with his condition and ostomy.    Data Reviewed: I have personally reviewed following labs and imaging studies  CBC: Recent Labs  Lab 08/27/21 0602 08/28/21 0601 08/29/21 0547 08/30/21 0558 08/31/21 0619  WBC 10.7* 7.8 8.2 11.4* 9.3  HGB 11.4* 11.5* 12.7* 12.2* 11.9*  HCT 35.0* 33.2* 37.2* 36.7* 35.4*  MCV 92.1 88.3 89.0 90.4 91.5  PLT 170 185 236 236 086   Basic Metabolic Panel: Recent Labs  Lab 08/27/21 0602 08/28/21 0601 08/29/21 0547 08/30/21 0558 08/31/21 7619  NA 133* 134* 137 137 136  K 3.5 3.3* 3.8 3.6 4.2  CL 106 105 107 103 100  CO2 16* 23 24 25 29   GLUCOSE 64* 110* 117* 117* 110*  BUN 10 5* <5* 5* 9  CREATININE 0.81 0.70 0.64 0.66 0.77  CALCIUM 7.7* 7.8*  8.5* 8.3* 8.5*  MG 1.8 1.7 1.9 1.8 1.8   GFR: Estimated Creatinine Clearance: 67.7 mL/min (by C-G formula based on SCr of 0.77 mg/dL). Liver Function Tests: Recent Labs  Lab 08/25/21 0100 08/26/21 0118 08/27/21 0602 08/30/21 0558 08/31/21 0619  AST 19 18 18 22 25   ALT 13 12 12 19 20   ALKPHOS 63 55 54 59 70  BILITOT 1.1 1.1 1.1 0.6 0.3  PROT 7.2 6.1* 5.3* 5.7* 6.0*  ALBUMIN 3.8 3.0* 2.5* 2.5* 2.6*   Recent Labs  Lab 08/25/21 0100  LIPASE 21   No results for input(s): AMMONIA in the last 168 hours. Coagulation Profile: No results for input(s): INR, PROTIME in the last 168 hours. Cardiac Enzymes: No results for input(s): CKTOTAL, CKMB, CKMBINDEX, TROPONINI in the last 168 hours. BNP (last 3 results) No results for input(s): PROBNP in the last 8760 hours. HbA1C: No results for input(s): HGBA1C in the last 72 hours. CBG: Recent Labs  Lab 08/30/21 0609 08/30/21 1110 08/30/21 1559 08/31/21 0728 08/31/21 1122  GLUCAP 122* 143* 156* 108* 111*   Lipid Profile: No results for input(s): CHOL, HDL, LDLCALC, TRIG, CHOLHDL, LDLDIRECT in the last 72 hours. Thyroid Function Tests: No results for input(s): TSH, T4TOTAL, FREET4, T3FREE, THYROIDAB in the last 72 hours. Anemia Panel: No results for input(s): VITAMINB12, FOLATE, FERRITIN, TIBC, IRON, RETICCTPCT in the last 72 hours. Urine analysis:    Component Value Date/Time   COLORURINE YELLOW 08/25/2021 0500   APPEARANCEUR CLEAR 08/25/2021 0500   LABSPEC <1.005 (L) 08/25/2021 0500   PHURINE 5.0 08/25/2021 0500   GLUCOSEU NEGATIVE 08/25/2021 0500   HGBUR SMALL (A) 08/25/2021 0500   BILIRUBINUR NEGATIVE 08/25/2021 0500   KETONESUR NEGATIVE 08/25/2021 0500   PROTEINUR NEGATIVE 08/25/2021 0500   NITRITE NEGATIVE 08/25/2021 0500   LEUKOCYTESUR NEGATIVE 08/25/2021 0500   Sepsis Labs: @LABRCNTIP (procalcitonin:4,lacticidven:4)  ) Recent Results (from the past 240 hour(s))  Resp Panel by RT-PCR (Flu A&B, Covid)  Nasopharyngeal Swab     Status: None   Collection Time: 08/25/21  6:06 AM   Specimen: Nasopharyngeal Swab; Nasopharyngeal(NP) swabs in vial transport medium  Result Value Ref Range Status   SARS Coronavirus 2 by RT PCR NEGATIVE NEGATIVE Final    Comment: (NOTE) SARS-CoV-2 target nucleic acids are NOT DETECTED.  The SARS-CoV-2 RNA is generally detectable in upper respiratory specimens during the acute phase of infection. The lowest concentration of SARS-CoV-2 viral copies this assay can detect is 138 copies/mL. A negative result does not preclude SARS-Cov-2 infection and should not be used as the sole basis for treatment or other patient management decisions. A negative result may occur with  improper specimen collection/handling, submission of specimen other than nasopharyngeal swab, presence of viral mutation(s) within the areas targeted by this assay, and inadequate number of viral copies(<138 copies/mL). A negative result must be combined with clinical observations, patient history, and epidemiological information. The expected result is Negative.  Fact Sheet for Patients:  EntrepreneurPulse.com.au  Fact Sheet for Healthcare Providers:  IncredibleEmployment.be  This test is no t yet approved or cleared by the [place] FDA and  has been authorized for detection and/or diagnosis of SARS-CoV-2 by FDA under an Emergency  Use Authorization (EUA). This EUA will remain  in effect (meaning this test can be used) for the duration of the COVID-19 declaration under Section 564(b)(1) of the Act, 21 U.S.C.section 360bbb-3(b)(1), unless the authorization is terminated  or revoked sooner.       Influenza A by PCR NEGATIVE NEGATIVE Final   Influenza B by PCR NEGATIVE NEGATIVE Final    Comment: (NOTE) The Xpert Xpress SARS-CoV-2/FLU/RSV plus assay is intended as an aid in the diagnosis of influenza from Nasopharyngeal swab specimens and should not be  used as a sole basis for treatment. Nasal washings and aspirates are unacceptable for Xpert Xpress SARS-CoV-2/FLU/RSV testing.  Fact Sheet for Patients: EntrepreneurPulse.com.au  Fact Sheet for Healthcare Providers: IncredibleEmployment.be  This test is not yet approved or cleared by the [place] FDA and has been authorized for detection and/or diagnosis of SARS-CoV-2 by FDA under an Emergency Use Authorization (EUA). This EUA will remain in effect (meaning this test can be used) for the duration of the COVID-19 declaration under Section 564(b)(1) of the Act, 21 U.S.C. section 360bbb-3(b)(1), unless the authorization is terminated or revoked.  Performed at [facility], [address] [place], [place] [address]   Surgical pcr screen     Status: None   Collection Time: 08/28/21  1:26 PM   Specimen: Nasal Mucosa; Nasal Swab  Result Value Ref Range Status   MRSA, PCR NEGATIVE NEGATIVE Final   Staphylococcus aureus NEGATIVE NEGATIVE Final    Comment: (NOTE) The Xpert SA Assay (FDA approved for NASAL specimens in patients 62 years of age and older), is one component of a comprehensive surveillance program. It is not intended to diagnose infection nor to guide or monitor treatment. Performed at [facility], [address] [place],  [address]          Radiology Studies: No results found.      Scheduled Meds:  chlorhexidine  15 mL Mouth/Throat Once   Or   mouth rinse  15 mL Mouth Rinse Once   Chlorhexidine Gluconate Cloth  6 each Topical Daily   LORazepam  1 mg Oral QHS   polyethylene glycol  17 g Oral BID   sertraline  25 mg Oral Daily   Continuous Infusions:  dextrose 5 % and 0.9% NaCl 30 mL/hr at 08/30/21 1413     LOS: 6 days      Ellie Lunch, student [facility] Pager [phone]  If 7PM-7AM, please contact night-coverage [email or web address] Password [email or web address] 08/31/2021, 1:09 PM

## 2021-08-31 NOTE — TOC Progression Note (Signed)
Transition of Care [facility]) - Progression Note    Patient Details  Name: Wesley [name] MRN: [id number] Date of Birth: 30-Jun-1946  [facility]) CM/SW Contact  Boneta Lucks, RN Phone Number: 08/31/2021, 12:45 PM  Clinical Narrative:   TOC spoke with sister, Patient wants to stay in [place]. They are accepting the bed offer at [facility]. Debbie with start [employer]. MD/RN updated.   Expected Discharge Plan: Skilled Nursing Facility Barriers to Discharge: Insurance Authorization

## 2021-08-31 NOTE — Evaluation (Signed)
Physical Therapy Evaluation Patient Details Name: Wesley [name] MRN: [id number] DOB: 15-Nov-1946 Today's Date: 08/31/2021  History of Present Illness  Wesley [name] is a 75 y.o. male with medical history significant for rectal adenocarcinoma with prior chemoradiation, ongoing tobacco abuse, and anxiety disorder who presented to the ED with complaints of sudden onset lower abdominal pain that began approximately 2 days ago after he had a bowel movement.  He denies any particular alleviating or aggravating factors.  He describes the pain as dull, achy, and constant.  He states that over the last 1 week he has had a greater amount of rectal bleeding than usual when he goes to wipe.  He denies any nausea, vomiting, change in bowel habit, fevers, or chills.   Clinical Impression  Patient demonstrates slow labored movement for rolling to side and sitting up from side lying position with c/o increased abdominal pain.  Patient limited to a few unsteady side steps with frequent leaning over RW for support due to generalized weakness.  Patient tolerated sitting up in chair after therapy with his sister in room.  Patient will benefit from continued physical therapy in hospital and recommended venue below to increase strength, balance, endurance for safe ADLs and gait.         Recommendations for follow up therapy are one component of a multi-disciplinary discharge planning process, led by the attending physician.  Recommendations may be updated based on patient status, additional functional criteria and insurance authorization.  Follow Up Recommendations SNF;Supervision for mobility/OOB;Supervision - Intermittent    Equipment Recommendations  Rolling walker with 5" wheels    Recommendations for Other Services       Precautions / Restrictions Precautions Precautions: Fall Restrictions Weight Bearing Restrictions: No      Mobility  Bed Mobility Overal bed mobility: Needs Assistance Bed  Mobility: Rolling;Sidelying to Sit Rolling: Mod assist Sidelying to sit: Mod assist;HOB elevated       General bed mobility comments: increased time, labored movement, required HOB partially rasied    Transfers Overall transfer level: Needs assistance Equipment used: Rolling walker (2 wheeled) Transfers: Sit to/from [facility] Sit to Stand: Min assist;Mod assist Stand pivot transfers: Min assist;Mod assist       General transfer comment: slow labored movement  Ambulation/Gait Ambulation/Gait assistance: Mod assist Gait Distance (Feet): 5 Feet Assistive device: Rolling walker (2 wheeled) Gait Pattern/deviations: Decreased step length - right;Decreased step length - left;Decreased stride length Gait velocity: decreased   General Gait Details: limited to a few slow labored side steps before having to sit due to c/o fatigue and abdominal pain  Stairs            Wheelchair Mobility    Modified Rankin (Stroke Patients Only)       Balance Overall balance assessment: Needs assistance Sitting-balance support: Feet supported;No upper extremity supported Sitting balance-Leahy Scale: Fair Sitting balance - Comments: seated at EOB   Standing balance support: During functional activity;Bilateral upper extremity supported Standing balance-Leahy Scale: Poor Standing balance comment: fair/poor using RW                             Pertinent Vitals/Pain Pain Assessment: 0-10 Pain Score: 8  Pain Location: abdomen at surgical site Pain Descriptors / Indicators: Aching;Sore Pain Intervention(s): Limited activity within patient's tolerance;Monitored during session;Premedicated before session;Repositioned    Home Living Family/patient expects to be discharged to:: Private residence Living Arrangements: Alone Available Help at Discharge:  Family;Available PRN/intermittently Type of Home: Other(Comment) Science writer) Home Access: Stairs to  enter Entrance Stairs-Rails: Psychiatric nurse of Steps: 3 Home Layout: One level Home Equipment: None      Prior Function Level of Independence: Independent         Comments: household and short distanced community ambulator, drives     Journalist, newspaper        Extremity/Trunk Assessment   Upper Extremity Assessment Upper Extremity Assessment: Generalized weakness    Lower Extremity Assessment Lower Extremity Assessment: Generalized weakness    Cervical / Trunk Assessment Cervical / Trunk Assessment: Normal  Communication   Communication: HOH  Cognition Arousal/Alertness: Awake/alert Behavior During Therapy: WFL for tasks assessed/performed Overall Cognitive Status: Within Functional Limits for tasks assessed                                        General Comments      Exercises     Assessment/Plan    PT Assessment Patient needs continued PT services  PT Problem List Decreased strength;Decreased activity tolerance;Decreased balance;Decreased mobility       PT Treatment Interventions DME instruction;Gait training;Stair training;Therapeutic activities;Functional mobility training;Therapeutic exercise;Balance training;Patient/family education    PT Goals (Current goals can be found in the Care Plan section)  Acute Rehab PT Goals Patient Stated Goal: return home after rehab PT Goal Formulation: With patient/family Time For Goal Achievement: 09/14/21 Potential to Achieve Goals: Good    Frequency Min 3X/week   Barriers to discharge        Co-evaluation               AM-PAC PT "6 Clicks" Mobility  Outcome Measure Help needed turning from your back to your side while in a flat bed without using bedrails?: A Lot Help needed moving from lying on your back to sitting on the side of a flat bed without using bedrails?: A Lot Help needed moving to and from a bed to a chair (including a wheelchair)?: A Lot Help needed  standing up from a chair using your arms (e.g., wheelchair or bedside chair)?: A Lot Help needed to walk in hospital room?: A Lot Help needed climbing 3-5 steps with a railing? : Total 6 Click Score: 11    End of Session Equipment Utilized During Treatment: Oxygen Activity Tolerance: Patient tolerated treatment well;Patient limited by fatigue Patient left: in chair;with call bell/phone within reach;with family/visitor present Nurse Communication: Mobility status PT Visit Diagnosis: Unsteadiness on feet (R26.81);Other abnormalities of gait and mobility (R26.89);Muscle weakness (generalized) (M62.81)    Time: 7494-4967 PT Time Calculation (min) (ACUTE ONLY): 35 min   Charges:   PT Evaluation $PT Eval Moderate Complexity: 1 Mod PT Treatments $Therapeutic Activity: 23-37 mins        1:58 PM, 08/31/21 Lonell Grandchild, MPT Physical Therapist with [facility] 336 [phone] office [phone] mobile phone

## 2021-08-31 NOTE — Progress Notes (Signed)
2 Days Post-Op  Subjective: Patient having incisional pain secondary to muscle spasms.  He denies any nausea or vomiting.  Objective: Vital signs in last 24 hours: Temp:  [98.1 F (36.7 C)-98.6 F (37 C)] 98.1 F (36.7 C) (09/30 0618) Pulse Rate:  [77-80] 77 (09/30 0618) Resp:  [17-18] 18 (09/30 0618) BP: (97-112)/(55-60) 112/60 (09/30 0618) SpO2:  [91 %-96 %] 96 % (09/30 0618) Last BM Date: 08/27/21  Intake/Output from previous day: 09/29 0701 - 09/30 0700 In: 1455.5 [P.O.:660; I.V.:630.2; IV Piggyback:165.4] Out: -  Intake/Output this shift: Total I/O In: 240 [P.O.:240] Out: -   General appearance: alert, cooperative, and no distress GI: Soft, incision healing well.  Ostomy pink.  Minimal air and stool noted in bag.  Bowel sounds minimal.  Lab Results:  Recent Labs    08/30/21 0558 08/31/21 0619  WBC 11.4* 9.3  HGB 12.2* 11.9*  HCT 36.7* 35.4*  PLT 236 260   BMET Recent Labs    08/30/21 0558 08/31/21 0619  NA 137 136  K 3.6 4.2  CL 103 100  CO2 25 29  GLUCOSE 117* 110*  BUN 5* 9  CREATININE 0.66 0.77  CALCIUM 8.3* 8.5*   PT/INR No results for input(s): LABPROT, INR in the last 72 hours.  Studies/Results: No results found.  Anti-infectives: Anti-infectives (From admission, onward)    Start     Dose/Rate Route Frequency Ordered Stop   08/30/21 1200  piperacillin-tazobactam (ZOSYN) IVPB 3.375 g        3.375 g 12.5 mL/hr over 240 Minutes Intravenous Every 8 hours 08/30/21 1046 08/31/21 0120   08/29/21 0600  cefoTEtan (CEFOTAN) 2 g in sodium chloride 0.9 % 100 mL IVPB        2 g 200 mL/hr over 30 Minutes Intravenous On call to O.R. 08/28/21 1306 08/29/21 1341   08/25/21 1200  piperacillin-tazobactam (ZOSYN) IVPB 3.375 g  Status:  Discontinued        3.375 g 12.5 mL/hr over 240 Minutes Intravenous Every 8 hours 08/25/21 0813 08/30/21 1046   08/25/21 0430  piperacillin-tazobactam (ZOSYN) IVPB 3.375 g        3.375 g 100 mL/hr over 30 Minutes  Intravenous  Once 08/25/21 0427 08/25/21 0604       Assessment/Plan: s/p Procedure(s): COLECTOMY WITH COLOSTOMY CREATION/HARTMANN PROCEDURE Impression: Stable on postoperative day 2.  Awaiting full return of bowel function.  I did encourage the patient to ask for pain medications as ordered.  We will add MiraLAX to help with his bowel function.  LOS: 6 days    Aviva Signs 08/31/2021

## 2021-08-31 NOTE — Plan of Care (Signed)
  Problem: Acute Rehab PT Goals(only PT should resolve) Goal: Pt Will Go Supine/Side To Sit Outcome: Progressing Flowsheets (Taken 08/31/2021 1403) Pt will go Supine/Side to Sit: with minimal assist Goal: Patient Will Transfer Sit To/From Stand Outcome: Progressing Flowsheets (Taken 08/31/2021 1403) Patient will transfer sit to/from stand: with minimal assist Goal: Pt Will Transfer Bed To Chair/Chair To Bed Outcome: Progressing Flowsheets (Taken 08/31/2021 1403) Pt will Transfer Bed to Chair/Chair to Bed: with min assist Goal: Pt Will Ambulate Outcome: Progressing Flowsheets (Taken 08/31/2021 1403) Pt will Ambulate:  50 feet  with minimal assist  with rolling walker   2:03 PM, 08/31/21 Lonell Grandchild, MPT Physical Therapist with [facility] [place] 336 [phone] office [phone] mobile phone

## 2021-08-31 NOTE — Progress Notes (Signed)
Had brown soft bm from rectum this morning. Dr. Arnoldo Morale stated residual and normal.  Started miralax today but no output in colostomy bag.  Sat up in chair today for several hours.

## 2021-09-01 DIAGNOSIS — K5900 Constipation, unspecified: Secondary | ICD-10-CM

## 2021-09-01 LAB — CBC
HCT: 35.6 % — ABNORMAL LOW (ref 39.0–52.0)
Hemoglobin: 12.1 g/dL — ABNORMAL LOW (ref 13.0–17.0)
MCH: 30.4 pg (ref 26.0–34.0)
MCHC: 34 g/dL (ref 30.0–36.0)
MCV: 89.4 fL (ref 80.0–100.0)
Platelets: 297 10*3/uL (ref 150–400)
RBC: 3.98 MIL/uL — ABNORMAL LOW (ref 4.22–5.81)
RDW: 13.6 % (ref 11.5–15.5)
WBC: 8.7 10*3/uL (ref 4.0–10.5)
nRBC: 0 % (ref 0.0–0.2)

## 2021-09-01 LAB — GLUCOSE, CAPILLARY
Glucose-Capillary: 112 mg/dL — ABNORMAL HIGH (ref 70–99)
Glucose-Capillary: 108 mg/dL — ABNORMAL HIGH (ref 70–99)
Glucose-Capillary: 143 mg/dL — ABNORMAL HIGH (ref 70–99)
Glucose-Capillary: 126 mg/dL — ABNORMAL HIGH (ref 70–99)

## 2021-09-01 LAB — COMPREHENSIVE METABOLIC PANEL
ALT: 18 U/L (ref 0–44)
AST: 20 U/L (ref 15–41)
Albumin: 2.4 g/dL — ABNORMAL LOW (ref 3.5–5.0)
Alkaline Phosphatase: 50 U/L (ref 38–126)
Anion gap: 6 (ref 5–15)
BUN: 8 mg/dL (ref 8–23)
CO2: 29 mmol/L (ref 22–32)
Calcium: 8 mg/dL — ABNORMAL LOW (ref 8.9–10.3)
Chloride: 97 mmol/L — ABNORMAL LOW (ref 98–111)
Creatinine, Ser: 0.67 mg/dL (ref 0.61–1.24)
GFR, Estimated: >60 mL/min (ref >60)
Glucose, Bld: 101 mg/dL — ABNORMAL HIGH (ref 70–99)
Potassium: 3.2 mmol/L — ABNORMAL LOW (ref 3.5–5.1)
Sodium: 132 mmol/L — ABNORMAL LOW (ref 135–145)
Total Bilirubin: 0.7 mg/dL (ref 0.3–1.2)
Total Protein: 5.8 g/dL — ABNORMAL LOW (ref 6.5–8.1)

## 2021-09-01 LAB — MAGNESIUM: Magnesium: 1.7 mg/dL (ref 1.7–2.4)

## 2021-09-01 MED ORDER — MAGNESIUM HYDROXIDE 400 MG/5ML PO SUSP
30.0000 mL | Freq: Three times a day (TID) | ORAL | Status: DC
  Administered 2021-09-01 (×3): 30 mL via ORAL
  Filled 2021-09-01 (×4): qty 30

## 2021-09-01 NOTE — Progress Notes (Signed)
3 Days Post-Op  Subjective: Patient states he is comfortable and not having incisional pain.  Is eating regular diet.  Objective: Vital signs in last 24 hours: Temp:  [97.5 F (36.4 C)-98.6 F (37 C)] 97.5 F (36.4 C) (10/01 0419) Pulse Rate:  [71-80] 80 (10/01 0419) Resp:  [16-18] 18 (10/01 0419) BP: (106-120)/(51-60) 114/60 (10/01 0419) SpO2:  [93 %-94 %] 94 % (10/01 0419) Last BM Date: 08/31/21  Intake/Output from previous day: 09/30 0701 - 10/01 0700 In: 720 [P.O.:720] Out: 400 [Urine:400] Intake/Output this shift: No intake/output data recorded.  General appearance: alert, cooperative, and no distress GI: Soft, incision healing well.  Ostomy pink.  No stool in bag this morning.  Lab Results:  Recent Labs    08/31/21 0619 09/01/21 0537  WBC 9.3 8.7  HGB 11.9* 12.1*  HCT 35.4* 35.6*  PLT 260 297   BMET Recent Labs    08/31/21 0619 09/01/21 0537  NA 136 132*  K 4.2 3.2*  CL 100 97*  CO2 29 29  GLUCOSE 110* 101*  BUN 9 8  CREATININE 0.77 0.67  CALCIUM 8.5* 8.0*   PT/INR No results for input(s): LABPROT, INR in the last 72 hours.  Studies/Results: No results found.  Anti-infectives: Anti-infectives (From admission, onward)    Start     Dose/Rate Route Frequency Ordered Stop   08/30/21 1200  piperacillin-tazobactam (ZOSYN) IVPB 3.375 g        3.375 g 12.5 mL/hr over 240 Minutes Intravenous Every 8 hours 08/30/21 1046 08/31/21 0120   08/29/21 0600  cefoTEtan (CEFOTAN) 2 g in sodium chloride 0.9 % 100 mL IVPB        2 g 200 mL/hr over 30 Minutes Intravenous On call to O.R. 08/28/21 1306 08/29/21 1341   08/25/21 1200  piperacillin-tazobactam (ZOSYN) IVPB 3.375 g  Status:  Discontinued        3.375 g 12.5 mL/hr over 240 Minutes Intravenous Every 8 hours 08/25/21 0813 08/30/21 1046   08/25/21 0430  piperacillin-tazobactam (ZOSYN) IVPB 3.375 g        3.375 g 100 mL/hr over 30 Minutes Intravenous  Once 08/25/21 0427 08/25/21 0604        Assessment/Plan: s/p Procedure(s): COLECTOMY WITH COLOSTOMY CREATION/HARTMANN PROCEDURE Impression: Postoperative day 3.  Awaiting full return of bowel function.  We will add milk of magnesia.  Okay for discharge to SNF from surgical standpoint.  LOS: 7 days    Aviva Signs 09/01/2021

## 2021-09-01 NOTE — Progress Notes (Signed)
PROGRESS NOTE    Wesley [name]  POE:[id number] DOB: 14-Apr-1946 DOA: 08/24/2021 PCP: Jearld Fenton, NP   Brief Narrative: Wesley [name], 75 yom, presented 08/24/21 with abdominal pain and bloody diarrhea. Hx of rectal adenocarcinoma, depression, gerd, tobacco use, hyperlipidemia. He reported lower abdominal pain with a bowel movement, reports blood in the stool, dark red in color. CT abd pelvis w contrast showed concern for IMA stenosis and an ischemic portion of sigmoid colon. Flexible sigmoidoscopy on 08/27/21 also shared impression of sigmoid colon ischemia, biopsies showed necrotic tissue. Proctocolectomy with colostomy was conducted 9/28. Today he appears more well, is eating, which previously he avoided any ingestion due to postprandial pain, and ostomy was provided. Discharge to snf is anticipated. Sugery has seen the patient today, starting Wesley [name] on miralax to encourage bowel movement.  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 -colectomy 09/28 with colostomy placement -reports improved abdominal pain -ostomy education provided by [facility] RN - Miralax and MOM ordered by surgery for induction of BM    Anxiety and Depression -zoloft 25 mg qd -alprazolam 0.5mg  po qhs prn -appears more positive with improved bowel pain symptoms   Hx rectal adenocarcinoma -previous radiation -colectomy 90/28/2022   DVT prophylaxis: SCDs Code Status: dnr Family Communication: sister at bedside Disposition Plan: anticipate snf on monday  Consultants:  General surgery Gastroenterology Psychiatry Palliative care  Procedures:  Flexible sigmoidoscopy Colectomy colostomy   Antimicrobials: Cefotetan pre-op Zosyn 3.375g q8hrs 09/24-09/30   Subjective: He is tolerating diet   Objective: Vitals:   08/31/21 1419 08/31/21 2028 08/31/21 2031 09/01/21 0419  BP: (!) 106/51 (!) 120/55  114/60  Pulse: 71 73  71 80  Resp: 16 18 16 18   Temp: 98.5 F (36.9 C) 98.6 F (37 C)  (!) 97.5 F (36.4 C)  TempSrc:  Oral  Oral  SpO2: 93% 93% 94% 94%  Weight:      Height:        Intake/Output Summary (Last 24 hours) at 09/01/2021 1519 Last data filed at 08/31/2021 1900 Gross per 24 hour  Intake 240 ml  Output 400 ml  Net -160 ml   Filed Weights   08/24/21 2043 08/27/21 1504  Weight: 60 kg 60 kg    Examination:  General exam: emaciated appearing male, NAD.   Respiratory system: Clear to auscultation. Respiratory effort normal.  Cardiovascular system: normal S1 & S2 heard. No JVD, murmurs, rubs, gallops or clicks. No pedal edema.  Gastrointestinal system: Abdomen is nondistended, soft and mildly tender. No organomegaly or masses felt. Normal bowel sounds heard. Ostomy appears healthy, pink and viable, trace stool seen.   Central nervous system: Alert and oriented. No focal neurological deficits.  Extremities: moves extremities appropriately  Skin: No obvious rashes, lesions, or ulcers  Psychiatry: Judgement and insight appear normal. Mood & affect are flat.    Data Reviewed: I have personally reviewed following labs and imaging studies  CBC: Recent Labs  Lab 08/28/21 0601 08/29/21 0547 08/30/21 0558 08/31/21 0619 09/01/21 0537  WBC 7.8 8.2 11.4* 9.3 8.7  HGB 11.5* 12.7* 12.2* 11.9* 12.1*  HCT 33.2* 37.2* 36.7* 35.4* 35.6*  MCV 88.3 89.0 90.4 91.5 89.4  PLT 185 236 236 260 315   Basic Metabolic Panel: Recent Labs  Lab 08/28/21 0601 08/29/21 0547 08/30/21 0558 08/31/21 0619 09/01/21 0537  NA 134* 137 137 136 132*  K 3.3* 3.8 3.6 4.2 3.2*  CL 105 107  103 100 97*  CO2 23 24 25 29 29   GLUCOSE 110* 117* 117* 110* 101*  BUN 5* <5* 5* 9 8  CREATININE 0.70 0.64 0.66 0.77 0.67  CALCIUM 7.8* 8.5* 8.3* 8.5* 8.0*  MG 1.7 1.9 1.8 1.8 1.7   GFR: Estimated Creatinine Clearance: 67.7 mL/min (by C-G formula based on SCr of 0.67 mg/dL). Liver Function Tests: Recent Labs  Lab  08/26/21 0118 08/27/21 0602 08/30/21 0558 08/31/21 0619 09/01/21 0537  AST 18 18 22 25 20   ALT 12 12 19 20 18   ALKPHOS 55 54 59 70 50  BILITOT 1.1 1.1 0.6 0.3 0.7  PROT 6.1* 5.3* 5.7* 6.0* 5.8*  ALBUMIN 3.0* 2.5* 2.5* 2.6* 2.4*   No results for input(s): LIPASE, AMYLASE in the last 168 hours.  No results for input(s): AMMONIA in the last 168 hours. Coagulation Profile: No results for input(s): INR, PROTIME in the last 168 hours. Cardiac Enzymes: No results for input(s): CKTOTAL, CKMB, CKMBINDEX, TROPONINI in the last 168 hours. BNP (last 3 results) No results for input(s): PROBNP in the last 8760 hours. HbA1C: No results for input(s): HGBA1C in the last 72 hours. CBG: Recent Labs  Lab 08/31/21 0728 08/31/21 1122 08/31/21 1607 09/01/21 0757 09/01/21 1218  GLUCAP 108* 111* 127* 112* 108*   Lipid Profile: No results for input(s): CHOL, HDL, LDLCALC, TRIG, CHOLHDL, LDLDIRECT in the last 72 hours. Thyroid Function Tests: No results for input(s): TSH, T4TOTAL, FREET4, T3FREE, THYROIDAB in the last 72 hours. Anemia Panel: No results for input(s): VITAMINB12, FOLATE, FERRITIN, TIBC, IRON, RETICCTPCT in the last 72 hours. Urine analysis:    Component Value Date/Time   COLORURINE YELLOW 08/25/2021 0500   APPEARANCEUR CLEAR 08/25/2021 0500   LABSPEC <1.005 (L) 08/25/2021 0500   PHURINE 5.0 08/25/2021 0500   GLUCOSEU NEGATIVE 08/25/2021 0500   HGBUR SMALL (A) 08/25/2021 0500   BILIRUBINUR NEGATIVE 08/25/2021 0500   KETONESUR NEGATIVE 08/25/2021 0500   PROTEINUR NEGATIVE 08/25/2021 0500   NITRITE NEGATIVE 08/25/2021 0500   LEUKOCYTESUR NEGATIVE 08/25/2021 0500    Recent Results (from the past 240 hour(s))  Resp Panel by RT-PCR (Flu A&B, Covid) Nasopharyngeal Swab     Status: None   Collection Time: 08/25/21  6:06 AM   Specimen: Nasopharyngeal Swab; Nasopharyngeal(NP) swabs in vial transport medium  Result Value Ref Range Status   SARS Coronavirus 2 by RT PCR NEGATIVE  NEGATIVE Final    Comment: (NOTE) SARS-CoV-2 target nucleic acids are NOT DETECTED.  The SARS-CoV-2 RNA is generally detectable in upper respiratory specimens during the acute phase of infection. The lowest concentration of SARS-CoV-2 viral copies this assay can detect is 138 copies/mL. A negative result does not preclude SARS-Cov-2 infection and should not be used as the sole basis for treatment or other patient management decisions. A negative result may occur with  improper specimen collection/handling, submission of specimen other than nasopharyngeal swab, presence of viral mutation(s) within the areas targeted by this assay, and inadequate number of viral copies(<138 copies/mL). A negative result must be combined with clinical observations, patient history, and epidemiological information. The expected result is Negative.  Fact Sheet for Patients:  EntrepreneurPulse.com.au  Fact Sheet for Healthcare Providers:  IncredibleEmployment.be  This test is no t yet approved or cleared by the [place] FDA and  has been authorized for detection and/or diagnosis of SARS-CoV-2 by FDA under an Emergency Use Authorization (EUA). This EUA will remain  in effect (meaning this test can be used) for the duration of  the COVID-19 declaration under Section 564(b)(1) of the Act, 21 U.S.C.section 360bbb-3(b)(1), unless the authorization is terminated  or revoked sooner.       Influenza A by PCR NEGATIVE NEGATIVE Final   Influenza B by PCR NEGATIVE NEGATIVE Final    Comment: (NOTE) The Xpert Xpress SARS-CoV-2/FLU/RSV plus assay is intended as an aid in the diagnosis of influenza from Nasopharyngeal swab specimens and should not be used as a sole basis for treatment. Nasal washings and aspirates are unacceptable for Xpert Xpress SARS-CoV-2/FLU/RSV testing.  Fact Sheet for Patients: EntrepreneurPulse.com.au  Fact Sheet for Healthcare  Providers: IncredibleEmployment.be  This test is not yet approved or cleared by the [place] FDA and has been authorized for detection and/or diagnosis of SARS-CoV-2 by FDA under an Emergency Use Authorization (EUA). This EUA will remain in effect (meaning this test can be used) for the duration of the COVID-19 declaration under Section 564(b)(1) of the Act, 21 U.S.C. section 360bbb-3(b)(1), unless the authorization is terminated or revoked.  Performed at [facility], [address], [place], [place] [address]   Surgical pcr screen     Status: None   Collection Time: 08/28/21  1:26 PM   Specimen: Nasal Mucosa; Nasal Swab  Result Value Ref Range Status   MRSA, PCR NEGATIVE NEGATIVE Final   Staphylococcus aureus NEGATIVE NEGATIVE Final    Comment: (NOTE) The Xpert SA Assay (FDA approved for NASAL specimens in patients 27 years of age and older), is one component of a comprehensive surveillance program. It is not intended to diagnose infection nor to guide or monitor treatment. Performed at [facility], [address], [place], [place] [address]      Radiology Studies: No results found.   Scheduled Meds:  chlorhexidine  15 mL Mouth/Throat Once   Or   mouth rinse  15 mL Mouth Rinse Once   Chlorhexidine Gluconate Cloth  6 each Topical Daily   LORazepam  1 mg Oral QHS   magnesium hydroxide  30 mL Oral TID   polyethylene glycol  17 g Oral BID   sertraline  25 mg Oral Daily   Continuous Infusions:  dextrose 5 % and 0.9% NaCl 30 mL/hr at 09/01/21 0909     LOS: 7 days   Irwin Brakeman, MD How to contact the [facility] Attending or Consulting provider [place] or covering provider during after hours [place], for this patient?  Check the care team in [facility] and look for a) attending/consulting TRH provider listed and b) the [facility] team listed Log into [email or web address] and use [place]'s universal password to access. If you do not have the password, please contact the  hospital operator. Locate the [facility] provider you are looking for under [facility] and page to a number that you can be directly reached. If you still have difficulty reaching the provider, please page the [facility] (Director on Call) for the Hospitalists listed on amion for assistance.

## 2021-09-02 LAB — COMPREHENSIVE METABOLIC PANEL
ALT: 21 U/L (ref 0–44)
AST: 22 U/L (ref 15–41)
Albumin: 2.3 g/dL — ABNORMAL LOW (ref 3.5–5.0)
Alkaline Phosphatase: 48 U/L (ref 38–126)
Anion gap: 5 (ref 5–15)
BUN: 9 mg/dL (ref 8–23)
CO2: 30 mmol/L (ref 22–32)
Calcium: 8.3 mg/dL — ABNORMAL LOW (ref 8.9–10.3)
Chloride: 101 mmol/L (ref 98–111)
Creatinine, Ser: 0.62 mg/dL (ref 0.61–1.24)
GFR, Estimated: >60 mL/min (ref >60)
Glucose, Bld: 115 mg/dL — ABNORMAL HIGH (ref 70–99)
Potassium: 3.6 mmol/L (ref 3.5–5.1)
Sodium: 136 mmol/L (ref 135–145)
Total Bilirubin: 0.3 mg/dL (ref 0.3–1.2)
Total Protein: 5.5 g/dL — ABNORMAL LOW (ref 6.5–8.1)

## 2021-09-02 LAB — CBC
HCT: 34.9 % — ABNORMAL LOW (ref 39.0–52.0)
Hemoglobin: 11.8 g/dL — ABNORMAL LOW (ref 13.0–17.0)
MCH: 30.9 pg (ref 26.0–34.0)
MCHC: 33.8 g/dL (ref 30.0–36.0)
MCV: 91.4 fL (ref 80.0–100.0)
Platelets: 296 10*3/uL (ref 150–400)
RBC: 3.82 MIL/uL — ABNORMAL LOW (ref 4.22–5.81)
RDW: 13.6 % (ref 11.5–15.5)
WBC: 6.4 10*3/uL (ref 4.0–10.5)
nRBC: 0 % (ref 0.0–0.2)

## 2021-09-02 LAB — GLUCOSE, CAPILLARY
Glucose-Capillary: 125 mg/dL — ABNORMAL HIGH (ref 70–99)
Glucose-Capillary: 130 mg/dL — ABNORMAL HIGH (ref 70–99)
Glucose-Capillary: 127 mg/dL — ABNORMAL HIGH (ref 70–99)

## 2021-09-02 MED ORDER — MAGNESIUM HYDROXIDE 400 MG/5ML PO SUSP
30.0000 mL | Freq: Three times a day (TID) | ORAL | Status: DC | PRN
  Administered 2021-09-02: 30 mL via ORAL

## 2021-09-02 NOTE — Progress Notes (Signed)
4 Days Post-Op  Subjective: Patient resting comfortably.  Tolerating regular diet well.  Has started to have significant stool output in his colostomy.  Denies abdominal pain at the present time.  Objective: Vital signs in last 24 hours: Temp:  [97.9 F (36.6 C)-98.5 F (36.9 C)] 98.5 F (36.9 C) (10/02 0458) Pulse Rate:  [68-75] 71 (10/02 0458) Resp:  [18-20] 20 (10/02 0458) BP: (113-123)/(52-54) 116/52 (10/02 0458) SpO2:  [95 %-97 %] 95 % (10/02 0458) Last BM Date: 08/31/21  Intake/Output from previous day: 10/01 0701 - 10/02 0700 In: 360 [P.O.:360] Out: 2550 [Urine:2550] Intake/Output this shift: No intake/output data recorded.  General appearance: alert, cooperative, and no distress GI: Abdomen is soft, incision healing well.  Bowel sounds active.  Colostomy full of stool and air.  Lab Results:  Recent Labs    09/01/21 0537 09/02/21 0505  WBC 8.7 6.4  HGB 12.1* 11.8*  HCT 35.6* 34.9*  PLT 297 296   BMET Recent Labs    09/01/21 0537 09/02/21 0505  NA 132* 136  K 3.2* 3.6  CL 97* 101  CO2 29 30  GLUCOSE 101* 115*  BUN 8 9  CREATININE 0.67 0.62  CALCIUM 8.0* 8.3*   PT/INR No results for input(s): LABPROT, INR in the last 72 hours.  Studies/Results: No results found.  Anti-infectives: Anti-infectives (From admission, onward)    Start     Dose/Rate Route Frequency Ordered Stop   08/30/21 1200  piperacillin-tazobactam (ZOSYN) IVPB 3.375 g        3.375 g 12.5 mL/hr over 240 Minutes Intravenous Every 8 hours 08/30/21 1046 08/31/21 0120   08/29/21 0600  cefoTEtan (CEFOTAN) 2 g in sodium chloride 0.9 % 100 mL IVPB        2 g 200 mL/hr over 30 Minutes Intravenous On call to O.R. 08/28/21 1306 08/29/21 1341   08/25/21 1200  piperacillin-tazobactam (ZOSYN) IVPB 3.375 g  Status:  Discontinued        3.375 g 12.5 mL/hr over 240 Minutes Intravenous Every 8 hours 08/25/21 0813 08/30/21 1046   08/25/21 0430  piperacillin-tazobactam (ZOSYN) IVPB 3.375 g         3.375 g 100 mL/hr over 30 Minutes Intravenous  Once 08/25/21 0427 08/25/21 0604       Assessment/Plan: s/p Procedure(s): COLECTOMY WITH COLOSTOMY CREATION/HARTMANN PROCEDURE Impression: Postoperative day 4.  Bowel function has returned.  Okay for discharge to SNF when available.  LOS: 8 days    Aviva Signs 09/02/2021

## 2021-09-02 NOTE — Progress Notes (Signed)
PROGRESS NOTE    Wesley [name]  INO:[id number] DOB: 11-Nov-1946 DOA: 08/24/2021 PCP: Jearld Fenton, NP   Brief Narrative: Wesley [name], 75 yom, presented 08/24/21 with abdominal pain and bloody diarrhea. Hx of rectal adenocarcinoma, depression, gerd, tobacco use, hyperlipidemia. He reported lower abdominal pain with a bowel movement, reports blood in the stool, dark red in color. CT abd pelvis w contrast showed concern for IMA stenosis and an ischemic portion of sigmoid colon. Flexible sigmoidoscopy on 08/27/21 also shared impression of sigmoid colon ischemia, biopsies showed necrotic tissue. Proctocolectomy with colostomy was conducted 9/28. Today he appears more well, is eating, which previously he avoided any ingestion due to postprandial pain, and ostomy was provided. Discharge to snf is anticipated. Sugery has seen the patient today, starting Mr Alessio on miralax to encourage bowel movement.  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    Anxiety and Depression -zoloft 25 mg qd -alprazolam 0.5mg  po qhs prn -appears more positive with improved bowel pain symptoms   Hx rectal adenocarcinoma -previous radiation -colectomy 08/29/2021   DVT prophylaxis: SCDs Code Status: DNR  Family Communication: sister at bedside Disposition Plan: anticipate snf on monday  Consultants:  General surgery Gastroenterology Psychiatry Palliative care  Procedures:  Flexible sigmoidoscopy Colectomy colostomy  Antimicrobials: Cefotetan pre-op Zosyn 3.375g q8hrs 09/24-09/30   Subjective: He reports some abdominal tenderness but he has been eating and drinking and now having ostomy output.   Objective: Vitals:   09/01/21 1540 09/01/21 2214 09/02/21 0458  09/02/21 1442  BP: (!) 113/54 (!) 123/52 (!) 116/52 115/70  Pulse: 75 68 71 70  Resp: 18  20 19   Temp: 97.9 F (36.6 C)  98.5 F (36.9 C) 98.6 F (37 C)  TempSrc: Oral  Oral Oral  SpO2: 97% 95% 95% 95%  Weight:      Height:        Intake/Output Summary (Last 24 hours) at 09/02/2021 1650 Last data filed at 09/02/2021 1300 Gross per 24 hour  Intake 600 ml  Output 2550 ml  Net -1950 ml   Filed Weights   08/24/21 2043 08/27/21 1504  Weight: 60 kg 60 kg    Examination:  General exam: emaciated appearing male, NAD.   Respiratory system: Clear to auscultation. Respiratory effort normal.  Cardiovascular system: normal S1 & S2 heard. No JVD, murmurs, rubs, gallops or clicks. No pedal edema.  Gastrointestinal system: Abdomen is nondistended, soft and mildly tender. No organomegaly or masses felt. Normal bowel sounds heard. Ostomy appears healthy, pink and viable, large amount of stool seen in bag.   Central nervous system: Alert and oriented. No focal neurological deficits.  Extremities: moves extremities appropriately  Skin: No obvious rashes, lesions, or ulcers  Psychiatry: Judgement and insight appear normal. Mood & affect are flat.    Data Reviewed: I have personally reviewed following labs and imaging studies  CBC: Recent Labs  Lab 08/29/21 0547 08/30/21 0558 08/31/21 0619 09/01/21 0537 09/02/21 0505  WBC 8.2 11.4* 9.3 8.7 6.4  HGB 12.7* 12.2* 11.9* 12.1* 11.8*  HCT 37.2* 36.7* 35.4* 35.6* 34.9*  MCV 89.0 90.4 91.5 89.4 91.4  PLT 236 236 260 297 096   Basic Metabolic Panel: Recent Labs  Lab 08/28/21 0601 08/29/21 0547 08/30/21 0558 08/31/21 0619 09/01/21 0537 09/02/21 0505  NA  134* 137 137 136 132* 136  K 3.3* 3.8 3.6 4.2 3.2* 3.6  CL 105 107 103 100 97* 101  CO2 23 24 25 29 29 30   GLUCOSE 110* 117* 117* 110* 101* 115*  BUN 5* <5* 5* 9 8 9   CREATININE 0.70 0.64 0.66 0.77 0.67 0.62  CALCIUM 7.8* 8.5* 8.3* 8.5* 8.0* 8.3*  MG 1.7 1.9 1.8 1.8 1.7  --     GFR: Estimated Creatinine Clearance: 67.7 mL/min (by C-G formula based on SCr of 0.62 mg/dL). Liver Function Tests: Recent Labs  Lab 08/27/21 0602 08/30/21 0558 08/31/21 0619 09/01/21 0537 09/02/21 0505  AST 18 22 25 20 22   ALT 12 19 20 18 21   ALKPHOS 54 59 70 50 48  BILITOT 1.1 0.6 0.3 0.7 0.3  PROT 5.3* 5.7* 6.0* 5.8* 5.5*  ALBUMIN 2.5* 2.5* 2.6* 2.4* 2.3*   No results for input(s): LIPASE, AMYLASE in the last 168 hours.  No results for input(s): AMMONIA in the last 168 hours. Coagulation Profile: No results for input(s): INR, PROTIME in the last 168 hours. Cardiac Enzymes: No results for input(s): CKTOTAL, CKMB, CKMBINDEX, TROPONINI in the last 168 hours. BNP (last 3 results) No results for input(s): PROBNP in the last 8760 hours. HbA1C: No results for input(s): HGBA1C in the last 72 hours. CBG: Recent Labs  Lab 09/01/21 1747 09/01/21 2222 09/02/21 0729 09/02/21 1113 09/02/21 1617  GLUCAP 143* 126* 125* 130* 127*   Lipid Profile: No results for input(s): CHOL, HDL, LDLCALC, TRIG, CHOLHDL, LDLDIRECT in the last 72 hours. Thyroid Function Tests: No results for input(s): TSH, T4TOTAL, FREET4, T3FREE, THYROIDAB in the last 72 hours. Anemia Panel: No results for input(s): VITAMINB12, FOLATE, FERRITIN, TIBC, IRON, RETICCTPCT in the last 72 hours. Urine analysis:    Component Value Date/Time   COLORURINE YELLOW 08/25/2021 0500   APPEARANCEUR CLEAR 08/25/2021 0500   LABSPEC <1.005 (L) 08/25/2021 0500   PHURINE 5.0 08/25/2021 0500   GLUCOSEU NEGATIVE 08/25/2021 0500   HGBUR SMALL (A) 08/25/2021 0500   BILIRUBINUR NEGATIVE 08/25/2021 0500   KETONESUR NEGATIVE 08/25/2021 0500   PROTEINUR NEGATIVE 08/25/2021 0500   NITRITE NEGATIVE 08/25/2021 0500   LEUKOCYTESUR NEGATIVE 08/25/2021 0500    Recent Results (from the past 240 hour(s))  Resp Panel by RT-PCR (Flu A&B, Covid) Nasopharyngeal Swab     Status: None   Collection Time: 08/25/21  6:06 AM   Specimen:  Nasopharyngeal Swab; Nasopharyngeal(NP) swabs in vial transport medium  Result Value Ref Range Status   SARS Coronavirus 2 by RT PCR NEGATIVE NEGATIVE Final    Comment: (NOTE) SARS-CoV-2 target nucleic acids are NOT DETECTED.  The SARS-CoV-2 RNA is generally detectable in upper respiratory specimens during the acute phase of infection. The lowest concentration of SARS-CoV-2 viral copies this assay can detect is 138 copies/mL. A negative result does not preclude SARS-Cov-2 infection and should not be used as the sole basis for treatment or other patient management decisions. A negative result may occur with  improper specimen collection/handling, submission of specimen other than nasopharyngeal swab, presence of viral mutation(s) within the areas targeted by this assay, and inadequate number of viral copies(<138 copies/mL). A negative result must be combined with clinical observations, patient history, and epidemiological information. The expected result is Negative.  Fact Sheet for Patients:  EntrepreneurPulse.com.au  Fact Sheet for Healthcare Providers:  IncredibleEmployment.be  This test is no t yet approved or cleared by the [place] FDA and  has been authorized for detection and/or diagnosis  of SARS-CoV-2 by FDA under an Emergency Use Authorization (EUA). This EUA will remain  in effect (meaning this test can be used) for the duration of the COVID-19 declaration under Section 564(b)(1) of the Act, 21 U.S.C.section 360bbb-3(b)(1), unless the authorization is terminated  or revoked sooner.       Influenza A by PCR NEGATIVE NEGATIVE Final   Influenza B by PCR NEGATIVE NEGATIVE Final    Comment: (NOTE) The Xpert Xpress SARS-CoV-2/FLU/RSV plus assay is intended as an aid in the diagnosis of influenza from Nasopharyngeal swab specimens and should not be used as a sole basis for treatment. Nasal washings and aspirates are unacceptable for  Xpert Xpress SARS-CoV-2/FLU/RSV testing.  Fact Sheet for Patients: EntrepreneurPulse.com.au  Fact Sheet for Healthcare Providers: IncredibleEmployment.be  This test is not yet approved or cleared by the [place] FDA and has been authorized for detection and/or diagnosis of SARS-CoV-2 by FDA under an Emergency Use Authorization (EUA). This EUA will remain in effect (meaning this test can be used) for the duration of the COVID-19 declaration under Section 564(b)(1) of the Act, 21 U.S.C. section 360bbb-3(b)(1), unless the authorization is terminated or revoked.  Performed at [facility], [address] [place], [place] [address]   Surgical pcr screen     Status: None   Collection Time: 08/28/21  1:26 PM   Specimen: Nasal Mucosa; Nasal Swab  Result Value Ref Range Status   MRSA, PCR NEGATIVE NEGATIVE Final   Staphylococcus aureus NEGATIVE NEGATIVE Final    Comment: (NOTE) The Xpert SA Assay (FDA approved for NASAL specimens in patients 68 years of age and older), is one component of a comprehensive surveillance program. It is not intended to diagnose infection nor to guide or monitor treatment. Performed at [facility], [address] [place], [place] [address]      Radiology Studies: No results found.   Scheduled Meds:  chlorhexidine  15 mL Mouth/Throat Once   Or   mouth rinse  15 mL Mouth Rinse Once   Chlorhexidine Gluconate Cloth  6 each Topical Daily   LORazepam  1 mg Oral QHS   polyethylene glycol  17 g Oral BID   sertraline  25 mg Oral Daily   Continuous Infusions:  dextrose 5 % and 0.9% NaCl 30 mL/hr at 09/01/21 0909     LOS: 8 days   Irwin Brakeman, MD How to contact the [facility] Attending or Consulting provider [place] or covering provider during after hours [place], for this patient?  Check the care team in [facility] and look for a) attending/consulting TRH provider listed and b) the [facility] team listed Log into [email or web address]  and use [place]'s universal password to access. If you do not have the password, please contact the hospital operator. Locate the [facility] provider you are looking for under [facility] and page to a number that you can be directly reached. If you still have difficulty reaching the provider, please page the [facility] (Director on Call) for the Hospitalists listed on amion for assistance.

## 2021-09-02 NOTE — TOC Progression Note (Signed)
Transition of Care [facility]) - Progression Note    Patient Details  Name: Wesley [name] MRN: [id number] Date of Birth: 20-Jun-1946  [facility] CM/SW [place], [place] Phone Number: 09/02/2021, 1:50 PM  Clinical Narrative:    CSW spoke to [place] with [facility] for update on insurance auth. There is currently no update on auth. TOC to follow.   Expected Discharge Plan: Skilled Nursing Facility Barriers to Discharge: Insurance Authorization  Expected Discharge Plan and Services Expected Discharge Plan: [place]                                               Social Determinants of Health (SDOH) Interventions    Readmission Risk Interventions Readmission Risk Prevention Plan 08/29/2021  Medication Screening Complete  Transportation Screening Complete  Some recent data might be hidden

## 2021-09-03 LAB — CBC
HCT: 36.8 % — ABNORMAL LOW (ref 39.0–52.0)
Hemoglobin: 12.4 g/dL — ABNORMAL LOW (ref 13.0–17.0)
MCH: 30.9 pg (ref 26.0–34.0)
MCHC: 33.7 g/dL (ref 30.0–36.0)
MCV: 91.8 fL (ref 80.0–100.0)
Platelets: 334 10*3/uL (ref 150–400)
RBC: 4.01 MIL/uL — ABNORMAL LOW (ref 4.22–5.81)
RDW: 13.7 % (ref 11.5–15.5)
WBC: 9.4 10*3/uL (ref 4.0–10.5)
nRBC: 0 % (ref 0.0–0.2)

## 2021-09-03 LAB — COMPREHENSIVE METABOLIC PANEL
ALT: 18 U/L (ref 0–44)
AST: 18 U/L (ref 15–41)
Albumin: 2.5 g/dL — ABNORMAL LOW (ref 3.5–5.0)
Alkaline Phosphatase: 52 U/L (ref 38–126)
Anion gap: 6 (ref 5–15)
BUN: 9 mg/dL (ref 8–23)
CO2: 26 mmol/L (ref 22–32)
Calcium: 8 mg/dL — ABNORMAL LOW (ref 8.9–10.3)
Chloride: 102 mmol/L (ref 98–111)
Creatinine, Ser: 0.59 mg/dL — ABNORMAL LOW (ref 0.61–1.24)
GFR, Estimated: >60 mL/min (ref >60)
Glucose, Bld: 113 mg/dL — ABNORMAL HIGH (ref 70–99)
Potassium: 3.9 mmol/L (ref 3.5–5.1)
Sodium: 134 mmol/L — ABNORMAL LOW (ref 135–145)
Total Bilirubin: 0.6 mg/dL (ref 0.3–1.2)
Total Protein: 5.8 g/dL — ABNORMAL LOW (ref 6.5–8.1)

## 2021-09-03 LAB — GLUCOSE, CAPILLARY
Glucose-Capillary: 129 mg/dL — ABNORMAL HIGH (ref 70–99)
Glucose-Capillary: 112 mg/dL — ABNORMAL HIGH (ref 70–99)
Glucose-Capillary: 111 mg/dL — ABNORMAL HIGH (ref 70–99)

## 2021-09-03 NOTE — TOC Progression Note (Signed)
Transition of Care [facility]) - Progression Note    Patient Details  Name: Wesley [name] MRN: [id number] Date of Birth: Apr 05, 1946  Transition of Care [facility]) CM/SW Contact  Salome Arnt, [place] Phone Number: 09/03/2021, 1:28 PM  Clinical Narrative:  LCSW spoke with Jackelyn Poling at [place]. Auth not received yet. TOC will continue to follow.      Expected Discharge Plan: Skilled Nursing Facility Barriers to Discharge: Insurance Authorization  Expected Discharge Plan and Services Expected Discharge Plan: [place]                                               Social Determinants of Health (SDOH) Interventions    Readmission Risk Interventions Readmission Risk Prevention Plan 08/29/2021  Medication Screening Complete  Transportation Screening Complete  Some recent data might be hidden

## 2021-09-03 NOTE — Progress Notes (Signed)
PROGRESS NOTE    Wesley [name]  KDT:[id number] DOB: November 16, 1946 DOA: 08/24/2021 PCP: Jearld Fenton, NP   Brief Narrative: Wesley [name], 27 yom, presented 08/24/21 with abdominal pain and bloody diarrhea. Hx of rectal adenocarcinoma, depression, gerd, tobacco use, hyperlipidemia. He reported lower abdominal pain with a bowel movement, reports blood in the stool, dark red in color. CT abd pelvis w contrast showed concern for IMA stenosis and an ischemic portion of sigmoid colon. Flexible sigmoidoscopy on 08/27/21 also shared impression of sigmoid colon ischemia, biopsies showed necrotic tissue. Proctocolectomy with colostomy was conducted 9/28. Today he appears more well, is eating, which previously he avoided any ingestion due to postprandial pain, and ostomy was provided. Discharge to snf is anticipated. Sugery has seen the patient today, starting [name] on miralax to encourage bowel movement.  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  - continue current management   Anxiety and Depression -zoloft 25 mg qd -alprazolam 0.5mg  po qhs prn -continue treatments    Hx rectal adenocarcinoma -previous radiation -colectomy 08/29/2021   DVT prophylaxis: SCDs Code Status: DNR  Family Communication: sister at bedside Disposition Plan: anticipate SNF when insurance authorization received  Consultants:  General surgery Gastroenterology Psychiatry Palliative care  Procedures:  Flexible sigmoidoscopy Colectomy colostomy  Antimicrobials: Cefotetan pre-op Zosyn 3.375g q8hrs 09/24-09/30   Subjective: He not having much appetite today.    Objective: Vitals:   09/02/21 0458 09/02/21 1442 09/02/21 2051 09/03/21 0521  BP: (!) 116/52 115/70 (!)  105/44 108/86  Pulse: 71 70 72 81  Resp: 20 19 18 17   Temp: 98.5 F (36.9 C) 98.6 F (37 C) 98.3 F (36.8 C) 98.8 F (37.1 C)  TempSrc: Oral Oral Oral   SpO2: 95% 95% 90% 91%  Weight:      Height:        Intake/Output Summary (Last 24 hours) at 09/03/2021 1329 Last data filed at 09/03/2021 1100 Gross per 24 hour  Intake 750 ml  Output 2700 ml  Net -1950 ml   Filed Weights   08/24/21 2043 08/27/21 1504  Weight: 60 kg 60 kg    Examination:  General exam: emaciated appearing male, NAD.   Respiratory system: Clear to auscultation. Respiratory effort normal.  Cardiovascular system: normal S1 & S2 heard. No JVD, murmurs, rubs, gallops or clicks. No pedal edema.  Gastrointestinal system: Abdomen is nondistended, soft and mildly tender. No organomegaly or masses felt. Normal bowel sounds heard. Ostomy appears healthy, pink and viable, large amount of stool seen in bag.   Central nervous system: Alert and oriented. No focal neurological deficits.  Extremities: moves extremities appropriately  Skin: No obvious rashes, lesions, or ulcers  Psychiatry: Judgement and insight appear poor. Mood & affect are flat.    Data Reviewed: I have personally reviewed following labs and imaging studies  CBC: Recent Labs  Lab 08/30/21 0558 08/31/21 0619 09/01/21 0537 09/02/21 0505 09/03/21 0621  WBC 11.4* 9.3 8.7 6.4 9.4  HGB 12.2* 11.9* 12.1* 11.8* 12.4*  HCT 36.7* 35.4* 35.6* 34.9* 36.8*  MCV 90.4 91.5 89.4 91.4 91.8  PLT 236 260 297 296 998   Basic Metabolic Panel: Recent Labs  Lab 08/28/21 0601 08/29/21 0547 08/30/21 0558 08/31/21 0619 09/01/21 0537 09/02/21 0505 09/03/21 0621  NA 134* 137 137 136 132*  136 134*  K 3.3* 3.8 3.6 4.2 3.2* 3.6 3.9  CL 105 107 103 100 97* 101 102  CO2 23 24 25 29 29 30 26   GLUCOSE 110* 117* 117* 110* 101* 115* 113*  BUN 5* <5* 5* 9 8 9 9   CREATININE 0.70 0.64 0.66 0.77 0.67 0.62 0.59*  CALCIUM 7.8* 8.5* 8.3* 8.5* 8.0* 8.3* 8.0*  MG 1.7  1.9 1.8 1.8 1.7  --   --    GFR: Estimated Creatinine Clearance: 67.7 mL/min (A) (by C-G formula based on SCr of 0.59 mg/dL (L)). Liver Function Tests: Recent Labs  Lab 08/30/21 0558 08/31/21 0619 09/01/21 0537 09/02/21 0505 09/03/21 0621  AST 22 25 20 22 18   ALT 19 20 18 21 18   ALKPHOS 59 70 50 48 52  BILITOT 0.6 0.3 0.7 0.3 0.6  PROT 5.7* 6.0* 5.8* 5.5* 5.8*  ALBUMIN 2.5* 2.6* 2.4* 2.3* 2.5*   No results for input(s): LIPASE, AMYLASE in the last 168 hours.  No results for input(s): AMMONIA in the last 168 hours. Coagulation Profile: No results for input(s): INR, PROTIME in the last 168 hours. Cardiac Enzymes: No results for input(s): CKTOTAL, CKMB, CKMBINDEX, TROPONINI in the last 168 hours. BNP (last 3 results) No results for input(s): PROBNP in the last 8760 hours. HbA1C: No results for input(s): HGBA1C in the last 72 hours. CBG: Recent Labs  Lab 09/02/21 0729 09/02/21 1113 09/02/21 1617 09/03/21 0748 09/03/21 1132  GLUCAP 125* 130* 127* 129* 112*   Lipid Profile: No results for input(s): CHOL, HDL, LDLCALC, TRIG, CHOLHDL, LDLDIRECT in the last 72 hours. Thyroid Function Tests: No results for input(s): TSH, T4TOTAL, FREET4, T3FREE, THYROIDAB in the last 72 hours. Anemia Panel: No results for input(s): VITAMINB12, FOLATE, FERRITIN, TIBC, IRON, RETICCTPCT in the last 72 hours. Urine analysis:    Component Value Date/Time   COLORURINE YELLOW 08/25/2021 0500   APPEARANCEUR CLEAR 08/25/2021 0500   LABSPEC <1.005 (L) 08/25/2021 0500   PHURINE 5.0 08/25/2021 0500   GLUCOSEU NEGATIVE 08/25/2021 0500   HGBUR SMALL (A) 08/25/2021 0500   BILIRUBINUR NEGATIVE 08/25/2021 0500   KETONESUR NEGATIVE 08/25/2021 0500   PROTEINUR NEGATIVE 08/25/2021 0500   NITRITE NEGATIVE 08/25/2021 0500   LEUKOCYTESUR NEGATIVE 08/25/2021 0500    Recent Results (from the past 240 hour(s))  Resp Panel by RT-PCR (Flu A&B, Covid) Nasopharyngeal Swab     Status: None   Collection Time:  08/25/21  6:06 AM   Specimen: Nasopharyngeal Swab; Nasopharyngeal(NP) swabs in vial transport medium  Result Value Ref Range Status   SARS Coronavirus 2 by RT PCR NEGATIVE NEGATIVE Final    Comment: (NOTE) SARS-CoV-2 target nucleic acids are NOT DETECTED.  The SARS-CoV-2 RNA is generally detectable in upper respiratory specimens during the acute phase of infection. The lowest concentration of SARS-CoV-2 viral copies this assay can detect is 138 copies/mL. A negative result does not preclude SARS-Cov-2 infection and should not be used as the sole basis for treatment or other patient management decisions. A negative result may occur with  improper specimen collection/handling, submission of specimen other than nasopharyngeal swab, presence of viral mutation(s) within the areas targeted by this assay, and inadequate number of viral copies(<138 copies/mL). A negative result must be combined with clinical observations, patient history, and epidemiological information. The expected result is Negative.  Fact Sheet for Patients:  EntrepreneurPulse.com.au  Fact Sheet for Healthcare Providers:  IncredibleEmployment.be  This test is no t yet approved or cleared by the [place] and  has been authorized for detection and/or diagnosis of SARS-CoV-2 by FDA under an Emergency Use Authorization (EUA). This EUA will remain  in effect (meaning this test can be used) for the duration of the COVID-19 declaration under Section 564(b)(1) of the Act, 21 U.S.C.section 360bbb-3(b)(1), unless the authorization is terminated  or revoked sooner.       Influenza A by PCR NEGATIVE NEGATIVE Final   Influenza B by PCR NEGATIVE NEGATIVE Final    Comment: (NOTE) The Xpert Xpress SARS-CoV-2/FLU/RSV plus assay is intended as an aid in the diagnosis of influenza from Nasopharyngeal swab specimens and should not be used as a sole basis for treatment. Nasal washings  and aspirates are unacceptable for Xpert Xpress SARS-CoV-2/FLU/RSV testing.  Fact Sheet for Patients: EntrepreneurPulse.com.au  Fact Sheet for Healthcare Providers: IncredibleEmployment.be  This test is not yet approved or cleared by the [place] FDA and has been authorized for detection and/or diagnosis of SARS-CoV-2 by FDA under an Emergency Use Authorization (EUA). This EUA will remain in effect (meaning this test can be used) for the duration of the COVID-19 declaration under Section 564(b)(1) of the Act, 21 U.S.C. section 360bbb-3(b)(1), unless the authorization is terminated or revoked.  Performed at [facility], [address], [place], [place] [address]   Surgical pcr screen     Status: None   Collection Time: 08/28/21  1:26 PM   Specimen: Nasal Mucosa; Nasal Swab  Result Value Ref Range Status   MRSA, PCR NEGATIVE NEGATIVE Final   Staphylococcus aureus NEGATIVE NEGATIVE Final    Comment: (NOTE) The Xpert SA Assay (FDA approved for NASAL specimens in patients 46 years of age and older), is one component of a comprehensive surveillance program. It is not intended to diagnose infection nor to guide or monitor treatment. Performed at [facility], [address] [place], [place] [address]      Radiology Studies: No results found.   Scheduled Meds:  chlorhexidine  15 mL Mouth/Throat Once   Or   mouth rinse  15 mL Mouth Rinse Once   Chlorhexidine Gluconate Cloth  6 each Topical Daily   polyethylene glycol  17 g Oral BID   sertraline  25 mg Oral Daily   Continuous Infusions:  dextrose 5 % and 0.9% NaCl 30 mL/hr at 09/02/21 1744     LOS: 9 days   Mareesa Gathright Wynetta Emery, MD How to contact the [facility] Attending or Consulting provider [place] or covering provider during after hours [place], for this patient?  Check the care team in [facility] and look for a) attending/consulting TRH provider listed and b) the [facility] team listed Log into  [email or web address] and use [place]'s universal password to access. If you do not have the password, please contact the hospital operator. Locate the [facility] provider you are looking for under [facility] and page to a number that you can be directly reached. If you still have difficulty reaching the provider, please page the [facility] (Director on Call) for the Hospitalists listed on amion for assistance.

## 2021-09-03 NOTE — Progress Notes (Addendum)
[facility]  Patient eating and having stool in ostomy. Ostomy pink. Does not talk much. Sister In room. SW working on [employer].  BP 108/86 (BP Location: Left Arm)   Pulse 81   Temp 98.8 F (37.1 C)   Resp 17   Ht 5\' 6"  (1.676 m)   Wt 60 kg   SpO2 91%   BMI 21.35 kg/m   Pathology with ischemic colitis, margins viable  Will get staples out week 10/10 Can remove honeycomb dressing when he leaves for rehab Diet as tolerated PRN pain Ostomy education, referral to outpatient ostomy clinic in   Future Appointments  Date Time Provider [place]  09/11/2021 11:30 AM Virl Cagey, MD [facility] None  10/18/2021  2:00 PM [facility] None  10/18/2021  2:40 PM Truitt Merle, MD [facility] None     Curlene Labrum, MD [facility] [place], [place] [address] [phone] (office)

## 2021-09-03 NOTE — Care Management Important Message (Signed)
Important Message  Patient Details  Name: Wesley [name] MRN: [id number] Date of Birth: 08/24/1946   Medicare Important Message Given:  Yes     Tommy Medal 09/03/2021, 12:01 PM

## 2021-09-04 LAB — CBC
HCT: 40.4 % (ref 39.0–52.0)
Hemoglobin: 13.7 g/dL (ref 13.0–17.0)
MCH: 30.6 pg (ref 26.0–34.0)
MCHC: 33.9 g/dL (ref 30.0–36.0)
MCV: 90.2 fL (ref 80.0–100.0)
Platelets: 361 10*3/uL (ref 150–400)
RBC: 4.48 MIL/uL (ref 4.22–5.81)
RDW: 13.2 % (ref 11.5–15.5)
WBC: 10.6 10*3/uL — ABNORMAL HIGH (ref 4.0–10.5)
nRBC: 0 % (ref 0.0–0.2)

## 2021-09-04 LAB — GLUCOSE, CAPILLARY
Glucose-Capillary: 125 mg/dL — ABNORMAL HIGH (ref 70–99)
Glucose-Capillary: 116 mg/dL — ABNORMAL HIGH (ref 70–99)
Glucose-Capillary: 120 mg/dL — ABNORMAL HIGH (ref 70–99)

## 2021-09-04 NOTE — Progress Notes (Signed)
Physical Therapy Treatment Patient Details Name: Wesley [name] MRN: [id number] DOB: 1946-07-24 Today's Date: 09/04/2021   History of Present Illness Wesley [name] is a 75 y.o. male who presented to the ED with complaints of sudden onset lower abdominal pain that began approximately 2 days ago after he had a bowel movement. Pt s/p Palliative Colectomy with end colostomy (Hartman's Procedure) 9/28. PMH: rectal adenocarcinoma with prior chemoradiation, ongoing tobacco abuse, and anxiety disorder    PT Comments    Pt initially hesitant to mobilize, but agreeable to encouragement. Pt cued through LE strengthening exercises with good motor control. Pt performs STS from EOB then able to pivot to recliner with min A to steady, maintains trunk flexed forward with slow, shuffling steps and declines further ambulation despite encouragement from therapist and friend in room due to pain. Pt tolerates remaining up in chair with nursing changing bed linen.   Recommendations for follow up therapy are one component of a multi-disciplinary discharge planning process, led by the attending physician.  Recommendations may be updated based on patient status, additional functional criteria and insurance authorization.  Follow Up Recommendations  SNF;Supervision for mobility/OOB;Supervision - Intermittent     Equipment Recommendations  Rolling walker with 5" wheels    Recommendations for Other Services       Precautions / Restrictions Precautions Precautions: Fall Restrictions Weight Bearing Restrictions: No     Mobility  Bed Mobility Overal bed mobility: Needs Assistance Bed Mobility: Supine to Sit   Sidelying to sit: Min assist;HOB elevated  General bed mobility comments: VCs for hand placement and sequencing with log rolling and to avoid breath holding, ultimately min A to upright trunk into sitting    Transfers Overall transfer level: Needs assistance Equipment used: Rolling walker (2  wheeled) Transfers: Sit to/from [facility] Sit to Stand: Min assist Stand pivot transfers: Min assist  General transfer comment: slow, labored movement, min A to power to stand with UEs assisting, good steadiness upon rising but maintains trunk flexed forward  Ambulation/Gait Ambulation/Gait assistance: Min assist Gait Distance (Feet): 5 Feet Assistive device: Rolling walker (2 wheeled) Gait Pattern/deviations: Step-to pattern;Decreased stride length;Shuffle;Trunk flexed Gait velocity: decreased   General Gait Details: slow, short shuffling steps to recliner, maintains trunk flexed despite VCs for upright posture   Stairs             Wheelchair Mobility    Modified Rankin (Stroke Patients Only)       Balance Overall balance assessment: Needs assistance Sitting-balance support: Feet supported Sitting balance-Leahy Scale: Fair Sitting balance - Comments: seated at EOB   Standing balance support: During functional activity;Bilateral upper extremity supported Standing balance-Leahy Scale: Poor Standing balance comment: reliant on UE support    Cognition Arousal/Alertness: Awake/alert Behavior During Therapy: WFL for tasks assessed/performed Overall Cognitive Status: Within Functional Limits for tasks assessed       Exercises General Exercises - Lower Extremity Ankle Circles/Pumps: Supine;AROM;Both;15 reps Short Arc Quad: Supine;AROM;Strengthening;Both;15 reps Heel Slides: Supine;AROM;Strengthening;Both;10 reps    General Comments        Pertinent Vitals/Pain Pain Assessment: Faces Faces Pain Scale: Hurts even more Pain Location: abdomen at surgical site Pain Descriptors / Indicators: Aching;Sore Pain Intervention(s): Limited activity within patient's tolerance;Monitored during session;Premedicated before session;Repositioned    Home Living                      Prior Function            PT Goals (current  goals can now be found  in the care plan section) Acute Rehab PT Goals Patient Stated Goal: return home after rehab PT Goal Formulation: With patient/family Time For Goal Achievement: 09/14/21 Potential to Achieve Goals: Good Progress towards PT goals: Progressing toward goals    Frequency    Min 3X/week      PT Plan Current plan remains appropriate    Co-evaluation              AM-PAC PT "6 Clicks" Mobility   Outcome Measure  Help needed turning from your back to your side while in a flat bed without using bedrails?: A Little Help needed moving from lying on your back to sitting on the side of a flat bed without using bedrails?: A Little Help needed moving to and from a bed to a chair (including a wheelchair)?: A Little Help needed standing up from a chair using your arms (e.g., wheelchair or bedside chair)?: A Little Help needed to walk in hospital room?: A Lot Help needed climbing 3-5 steps with a railing? : A Lot 6 Click Score: 16    End of Session   Activity Tolerance: Patient tolerated treatment well Patient left: in chair;with call bell/phone within reach;with nursing/sitter in room;with family/visitor present Nurse Communication: Mobility status PT Visit Diagnosis: Unsteadiness on feet (R26.81);Other abnormalities of gait and mobility (R26.89);Muscle weakness (generalized) (M62.81)     Time: 3545-6256 PT Time Calculation (min) (ACUTE ONLY): 31 min  Charges:  $Therapeutic Exercise: 8-22 mins $Therapeutic Activity: 8-22 mins                      Tori Lewis Keats PT, DPT 09/04/21, 12:29 PM

## 2021-09-04 NOTE — Progress Notes (Signed)
Patient PIV infiltrated, explained to patient the need to place another PIV.  Patient refused.  Dr. Wynetta Emery made aware.  Ok to leave PIV out per MD.

## 2021-09-04 NOTE — Progress Notes (Signed)
PROGRESS NOTE    Wesley [name]  GYI:[id number] DOB: [birth year] DOA: 08/24/2021 PCP: Jearld Fenton, NP   Brief Narrative: Wesley [name], 75 yom, presented 08/24/21 with abdominal pain and bloody diarrhea. Hx of rectal adenocarcinoma, depression, gerd, tobacco use, hyperlipidemia. He reported lower abdominal pain with a bowel movement, reports blood in the stool, dark red in color. CT abd pelvis w contrast showed concern for IMA stenosis and an ischemic portion of sigmoid colon. Flexible sigmoidoscopy on 08/27/21 also shared impression of sigmoid colon ischemia, biopsies showed necrotic tissue. Proctocolectomy with colostomy was conducted 9/28. Today he appears more well, is eating, which previously he avoided any ingestion due to postprandial pain, and ostomy was provided. Discharge to snf is anticipated. Sugery has seen the patient today, starting Wesley [name] on miralax to encourage bowel movement.  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  - continue current management   Anxiety and Depression -zoloft 25 mg qd -alprazolam 0.5mg  po qhs prn -continue treatments    Hx rectal adenocarcinoma -previous radiation -colectomy 08/29/2021   DVT prophylaxis: SCDs Code Status: DNR  Family Communication: sister at bedside Disposition Plan: planning for SNF when insurance authorization received  Consultants:  General surgery Gastroenterology Psychiatry Palliative care  Procedures:  Flexible sigmoidoscopy Colectomy colostomy  Antimicrobials: Cefotetan pre-op Zosyn 3.375g q8hrs 09/24-09/30   Subjective: He still having good ostomy output, c/o poor appetite today.  Pain slowly improving.    Objective: Vitals:   09/03/21 0521 09/03/21 1356  09/03/21 2100 09/04/21 0530  BP: 108/86 110/72 127/64 (!) 121/57  Pulse: 81 79 72 74  Resp: 17 18 17 16   Temp: 98.8 F (37.1 C) 98.6 F (37 C) 98.6 F (37 C) (!) 97.5 F (36.4 C)  TempSrc:  Oral Oral Oral  SpO2: 91% 91% 92% 90%  Weight:      Height:        Intake/Output Summary (Last 24 hours) at 09/04/2021 1213 Last data filed at 09/04/2021 1000 Gross per 24 hour  Intake 1049.22 ml  Output 2300 ml  Net -1250.78 ml   Filed Weights   08/24/21 2043 08/27/21 1504  Weight: 60 kg 60 kg    Examination:  General exam: emaciated appearing male, NAD.   Respiratory system: Clear to auscultation. Respiratory effort normal.  Cardiovascular system: normal S1 & S2 heard. No JVD, murmurs, rubs, gallops or clicks. No pedal edema.  Gastrointestinal system: Abdomen is nondistended, soft and mildly tender. No organomegaly or masses felt. Normal bowel sounds heard. Ostomy appears healthy, pink and viable, large amount of liquid brown stool seen in bag.   Central nervous system: Alert and oriented. No focal neurological deficits.  Extremities: moves extremities appropriately  Skin: No obvious rashes, lesions, or ulcers  Psychiatry: Judgement and insight appear poor. Mood & affect are flat.    Data Reviewed: I have personally reviewed following labs and imaging studies  CBC: Recent Labs  Lab 08/31/21 0619 09/01/21 0537 09/02/21 0505 09/03/21 0621 09/04/21 0701  WBC 9.3 8.7 6.4 9.4 10.6*  HGB 11.9* 12.1* 11.8* 12.4* 13.7  HCT 35.4* 35.6* 34.9* 36.8* 40.4  MCV 91.5 89.4 91.4 91.8 90.2  PLT 260 297 296 334 350   Basic Metabolic Panel: Recent Labs  Lab 08/29/21 0547 08/30/21 0558 08/31/21 0938 09/01/21 0537 09/02/21 0505  09/03/21 0621  NA 137 137 136 132* 136 134*  K 3.8 3.6 4.2 3.2* 3.6 3.9  CL 107 103 100 97* 101 102  CO2 24 25 29 29 30 26   GLUCOSE 117* 117* 110* 101* 115* 113*  BUN <5* 5* 9 8 9 9   CREATININE 0.64 0.66 0.77 0.67 0.62 0.59*  CALCIUM 8.5* 8.3* 8.5*  8.0* 8.3* 8.0*  MG 1.9 1.8 1.8 1.7  --   --    GFR: Estimated Creatinine Clearance: 67.7 mL/min (A) (by C-G formula based on SCr of 0.59 mg/dL (L)). Liver Function Tests: Recent Labs  Lab 08/30/21 0558 08/31/21 0619 09/01/21 0537 09/02/21 0505 09/03/21 0621  AST 22 25 20 22 18   ALT 19 20 18 21 18   ALKPHOS 59 70 50 48 52  BILITOT 0.6 0.3 0.7 0.3 0.6  PROT 5.7* 6.0* 5.8* 5.5* 5.8*  ALBUMIN 2.5* 2.6* 2.4* 2.3* 2.5*   No results for input(s): LIPASE, AMYLASE in the last 168 hours.  No results for input(s): AMMONIA in the last 168 hours. Coagulation Profile: No results for input(s): INR, PROTIME in the last 168 hours. Cardiac Enzymes: No results for input(s): CKTOTAL, CKMB, CKMBINDEX, TROPONINI in the last 168 hours. BNP (last 3 results) No results for input(s): PROBNP in the last 8760 hours. HbA1C: No results for input(s): HGBA1C in the last 72 hours. CBG: Recent Labs  Lab 09/03/21 0748 09/03/21 1132 09/03/21 1612 09/04/21 0745 09/04/21 1105  GLUCAP 129* 112* 111* 125* 116*   Lipid Profile: No results for input(s): CHOL, HDL, LDLCALC, TRIG, CHOLHDL, LDLDIRECT in the last 72 hours. Thyroid Function Tests: No results for input(s): TSH, T4TOTAL, FREET4, T3FREE, THYROIDAB in the last 72 hours. Anemia Panel: No results for input(s): VITAMINB12, FOLATE, FERRITIN, TIBC, IRON, RETICCTPCT in the last 72 hours. Urine analysis:    Component Value Date/Time   COLORURINE YELLOW 08/25/2021 0500   APPEARANCEUR CLEAR 08/25/2021 0500   LABSPEC <1.005 (L) 08/25/2021 0500   PHURINE 5.0 08/25/2021 0500   GLUCOSEU NEGATIVE 08/25/2021 0500   HGBUR SMALL (A) 08/25/2021 0500   BILIRUBINUR NEGATIVE 08/25/2021 0500   KETONESUR NEGATIVE 08/25/2021 0500   PROTEINUR NEGATIVE 08/25/2021 0500   NITRITE NEGATIVE 08/25/2021 0500   LEUKOCYTESUR NEGATIVE 08/25/2021 0500    Recent Results (from the past 240 hour(s))  Surgical pcr screen     Status: None   Collection Time: 08/28/21  1:26 PM    Specimen: Nasal Mucosa; Nasal Swab  Result Value Ref Range Status   MRSA, PCR NEGATIVE NEGATIVE Final   Staphylococcus aureus NEGATIVE NEGATIVE Final    Comment: (NOTE) The Xpert SA Assay (FDA approved for NASAL specimens in patients 25 years of age and older), is one component of a comprehensive surveillance program. It is not intended to diagnose infection nor to guide or monitor treatment. Performed at [facility], [address], [place], [place] [address]      Radiology Studies: No results found.   Scheduled Meds:  chlorhexidine  15 mL Mouth/Throat Once   Or   mouth rinse  15 mL Mouth Rinse Once   Chlorhexidine Gluconate Cloth  6 each Topical Daily   polyethylene glycol  17 g Oral BID   sertraline  25 mg Oral Daily   Continuous Infusions:  dextrose 5 % and 0.9% NaCl 30 mL/hr at 09/02/21 1744     LOS: 10 days   Irwin Brakeman, MD How to contact the [facility] Attending or Consulting provider [place] or covering provider during  after hours 7P -7A, for this patient?  Check the care team in [facility] and look for a) attending/consulting TR[facility] provider listed and b) the [facility] team listed Log into [email or web address] and use [place]'s universal password to access. If you do not have the password, please contact the hospital operator. Locate the [facility] provider you are looking for under [facility] and page to a number that you can be directly reached. If you still have difficulty reaching the provider, please page the [facility] (Director on Call) for the Hospitalists listed on amion for assistance.

## 2021-09-04 NOTE — Progress Notes (Signed)
[facility]  Still awaiting SNF. Having some abdominal pain, ostomy output.  Tolerating diet.   BP (!) 121/57 (BP Location: Left Arm)   Pulse 74   Temp (!) 97.5 F (36.4 C) (Oral)   Resp 16   Ht 5\' 6"  (1.676 m)   Wt 60 kg   SpO2 90%   BMI 21.35 kg/m   Future Appointments  Date Time Provider [place]  09/11/2021 11:30 AM Virl Cagey, MD [facility] None  10/18/2021  2:00 PM [facility] None  10/18/2021  2:40 PM Truitt Merle, MD [facility] None    Will see next week to remove staples.  Curlene Labrum, MD [facility] [address] [place], [place] [address] [phone] (office)

## 2021-09-05 DIAGNOSIS — R69 Illness, unspecified: Secondary | ICD-10-CM | POA: Diagnosis not present

## 2021-09-05 DIAGNOSIS — F339 Major depressive disorder, recurrent, unspecified: Secondary | ICD-10-CM | POA: Diagnosis not present

## 2021-09-05 DIAGNOSIS — G4700 Insomnia, unspecified: Secondary | ICD-10-CM | POA: Diagnosis not present

## 2021-09-05 DIAGNOSIS — F17200 Nicotine dependence, unspecified, uncomplicated: Secondary | ICD-10-CM | POA: Diagnosis not present

## 2021-09-05 DIAGNOSIS — D012 Carcinoma in situ of rectum: Secondary | ICD-10-CM | POA: Diagnosis not present

## 2021-09-05 DIAGNOSIS — I10 Essential (primary) hypertension: Secondary | ICD-10-CM | POA: Diagnosis not present

## 2021-09-05 DIAGNOSIS — C20 Malignant neoplasm of rectum: Secondary | ICD-10-CM | POA: Diagnosis not present

## 2021-09-05 DIAGNOSIS — Z933 Colostomy status: Secondary | ICD-10-CM | POA: Diagnosis not present

## 2021-09-05 DIAGNOSIS — K219 Gastro-esophageal reflux disease without esophagitis: Secondary | ICD-10-CM | POA: Diagnosis not present

## 2021-09-05 DIAGNOSIS — H903 Sensorineural hearing loss, bilateral: Secondary | ICD-10-CM | POA: Diagnosis not present

## 2021-09-05 DIAGNOSIS — R2689 Other abnormalities of gait and mobility: Secondary | ICD-10-CM | POA: Diagnosis not present

## 2021-09-05 DIAGNOSIS — Z85038 Personal history of other malignant neoplasm of large intestine: Secondary | ICD-10-CM | POA: Diagnosis not present

## 2021-09-05 DIAGNOSIS — R262 Difficulty in walking, not elsewhere classified: Secondary | ICD-10-CM | POA: Diagnosis not present

## 2021-09-05 DIAGNOSIS — R41841 Cognitive communication deficit: Secondary | ICD-10-CM | POA: Diagnosis not present

## 2021-09-05 DIAGNOSIS — K559 Vascular disorder of intestine, unspecified: Secondary | ICD-10-CM | POA: Diagnosis not present

## 2021-09-05 DIAGNOSIS — J449 Chronic obstructive pulmonary disease, unspecified: Secondary | ICD-10-CM | POA: Diagnosis not present

## 2021-09-05 DIAGNOSIS — R52 Pain, unspecified: Secondary | ICD-10-CM | POA: Diagnosis not present

## 2021-09-05 DIAGNOSIS — F0631 Mood disorder due to known physiological condition with depressive features: Secondary | ICD-10-CM | POA: Diagnosis not present

## 2021-09-05 DIAGNOSIS — Z9181 History of falling: Secondary | ICD-10-CM | POA: Diagnosis not present

## 2021-09-05 DIAGNOSIS — K55039 Acute (reversible) ischemia of large intestine, extent unspecified: Secondary | ICD-10-CM | POA: Diagnosis not present

## 2021-09-05 DIAGNOSIS — M6281 Muscle weakness (generalized): Secondary | ICD-10-CM | POA: Diagnosis not present

## 2021-09-05 LAB — CBC
HCT: 37.3 % — ABNORMAL LOW (ref 39.0–52.0)
Hemoglobin: 12.3 g/dL — ABNORMAL LOW (ref 13.0–17.0)
MCH: 29.8 pg (ref 26.0–34.0)
MCHC: 33 g/dL (ref 30.0–36.0)
MCV: 90.3 fL (ref 80.0–100.0)
Platelets: 305 10*3/uL (ref 150–400)
RBC: 4.13 MIL/uL — ABNORMAL LOW (ref 4.22–5.81)
RDW: 13.3 % (ref 11.5–15.5)
WBC: 9.2 10*3/uL (ref 4.0–10.5)
nRBC: 0 % (ref 0.0–0.2)

## 2021-09-05 LAB — GLUCOSE, CAPILLARY
Glucose-Capillary: 125 mg/dL — ABNORMAL HIGH (ref 70–99)
Glucose-Capillary: 109 mg/dL — ABNORMAL HIGH (ref 70–99)

## 2021-09-05 MED ORDER — SERTRALINE HCL 50 MG PO TABS: 50.0000 mg | ORAL_TABLET | Freq: Every day | ORAL | 3 refills | Status: AC

## 2021-09-05 MED ORDER — OXYCODONE HCL 5 MG PO TABS: 5.0000 mg | ORAL_TABLET | ORAL | 0 refills | Status: DC | PRN

## 2021-09-05 MED ORDER — ACETAMINOPHEN 325 MG PO TABS: 650.0000 mg | ORAL_TABLET | Freq: Four times a day (QID) | ORAL | 0 refills | Status: AC | PRN

## 2021-09-05 MED ORDER — ALPRAZOLAM 0.5 MG PO TABS: 0.5000 mg | ORAL_TABLET | Freq: Every evening | ORAL | 0 refills | Status: AC | PRN

## 2021-09-05 MED ORDER — ONDANSETRON HCL 4 MG PO TABS: 4.0000 mg | ORAL_TABLET | Freq: Four times a day (QID) | ORAL | 0 refills | Status: DC | PRN

## 2021-09-05 MED ORDER — POLYETHYLENE GLYCOL 3350 17 G PO PACK: 17.0000 g | PACK | Freq: Two times a day (BID) | ORAL | 0 refills | Status: AC

## 2021-09-05 NOTE — Discharge Summary (Signed)
Wesley [name], is a 75 y.o. male  DOB [birth year]  MRN [id number].  Admission date:  08/24/2021  Admitting Physician  Bernadette Hoit, DO  Discharge Date:  09/05/2021   Primary MD  Jearld Fenton, NP  Recommendations for primary care physician for things to follow:   1)Avoid ibuprofen/Advil/Aleve/Motrin/Goody Powders/Naproxen/BC powders/Meloxicam/Diclofenac/Indomethacin and other Nonsteroidal anti-inflammatory medications as these will make you more likely to bleed and can cause stomach ulcers, can also cause Kidney problems.   2)Repeat CBC and BMP on Monday 09/10/2021  3)Routine Ostomy Care---  Admission Diagnosis  Acute ischemic colitis ([place]) [K55.039] Ischemic colitis ([place]) [K55.9]  Discharge Diagnosis  Acute ischemic colitis ([place]) [K55.039] Ischemic colitis ([place]) [K55.9]   Principal Problem:   Depression due to physical illness Active Problems:   Tobacco use   Rectal adenocarcinoma ([place])   Acute ischemic colitis ([place])   Ischemic colitis ([place])      Past Medical History:  Diagnosis Date   Cancer ([place]) 10/21/2017   rectal cancer   Depression    GERD (gastroesophageal reflux disease)    HOH (hard of hearing)    Rectal adenocarcinoma ([place]) 11/07/2017   Stage III, radiation and chemo 2019   Wears dentures    full upper and lower   Wears hearing aid in both ears     Past Surgical History:  Procedure Laterality Date   BIOPSY  08/27/2021   Procedure: BIOPSY;  Surgeon: Daneil Dolin, MD;  [facility];  Service: Endoscopy;;  rectal biopsy   CATARACT EXTRACTION W/PHACO Left 05/17/2020   Procedure: CATARACT EXTRACTION PHACO AND INTRAOCULAR LENS PLACEMENT (IOC) LEFT 8.43  00:57.1  14.7%;  Surgeon: Leandrew Koyanagi, MD;  Location: [place];  Service: Ophthalmology;  Laterality: Left;   CATARACT EXTRACTION W/PHACO Right 07/19/2020   Procedure: CATARACT EXTRACTION  PHACO AND INTRAOCULAR LENS PLACEMENT ([place]) RIGHT MALYUGIN;  Surgeon: Leandrew Koyanagi, MD;  Location: [place];  Service: Ophthalmology;  Laterality: Right;  12.59, 01:23.4, 15.1%   CHOLECYSTECTOMY     COLECTOMY WITH COLOSTOMY CREATION/HARTMANN PROCEDURE N/A 08/29/2021   Procedure: COLECTOMY WITH COLOSTOMY CREATION/HARTMANN PROCEDURE;  Surgeon: Virl Cagey, MD;  Location: [facility];  Service: General;  Laterality: N/A;   EUS N/A 10/31/2017   Dr. Benson Norway: hypoechoic mass in rectum, partiall circumferential, tattoeed.   FLEXIBLE SIGMOIDOSCOPY N/A 08/27/2021   Procedure: FLEXIBLE SIGMOIDOSCOPY WITH PROPOFOL;  Surgeon: Daneil Dolin, MD;  [facility];  Service: Endoscopy;  Laterality: N/A;  NEEDS WITH PROPOFOL     HPI  from the history and physical done on the day of admission:   Chief Complaint: Lower abdominal pain   HPI: Wesley [name] is a 75 y.o. male with medical history significant for rectal adenocarcinoma with prior chemoradiation, ongoing tobacco abuse, and anxiety disorder who presented to the ED with complaints of sudden onset lower abdominal pain that began approximately 2 days ago after he had a bowel movement.  He denies any particular alleviating or aggravating factors.  He describes the  pain as dull, achy, and constant.  He states that over the last 1 week he has had a greater amount of rectal bleeding than usual when he goes to wipe.  He denies any nausea, vomiting, change in bowel habit, fevers, or chills.   ED Course: Stable vital signs noted and he is noted to have leukocytosis of 15,800.  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  EDP has discussed case with general surgery with recommendations to continue n.p.o. status with IV fluid and Zosyn for now.   Review of Systems: Reviewed as noted above, otherwise negative.      Hospital Course:   Brief Narrative: Wesley [name], 42 yom, presented 08/24/21 with abdominal pain and bloody diarrhea. Hx of rectal adenocarcinoma, depression, gerd, tobacco use, hyperlipidemia. He reported lower abdominal pain with a bowel movement, reports blood in the stool, dark red in color. CT abd pelvis w contrast showed concern for IMA stenosis and an ischemic portion of sigmoid colon. Flexible sigmoidoscopy on 08/27/21 also shared impression of sigmoid colon ischemia, biopsies showed necrotic tissue. Proctocolectomy with colostomy was conducted 9/28. Today he appears more well, is eating, which previously he avoided any ingestion due to postprandial pain, and ostomy was provided. Discharge to snf is anticipated. Sugery has seen the patient today, starting Wesley [name] on miralax to encourage bowel movement.   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 -Colectomy 08/29/21 with colostomy placement -Much improved abdominal pain -Ostomy education provided by [facility] RN - Pt now having good output from ostomy.  - continue current management    Anxiety and Depression -zoloft 50 mg qd -alprazolam 0.5mg  po qhs prn   Hx rectal adenocarcinoma -previous radiation -colectomy 08/29/2021   Code Status: DNR  Family Communication: sister at bedside Disposition Plan:  SNF Rehab   Consultants:  General surgery Gastroenterology Psychiatry Palliative care   Procedures:  Flexible sigmoidoscopy Colectomy colostomy   Antimicrobials: Cefotetan pre-op Zosyn 3.375g q8hrs 08/25/21-08/31/21  Discharge Condition: stable  Follow UP   Contact information for after-discharge care     [facility] .   Service: Skilled Nursing Contact information: [address] [place] [phone]                     Diet and Activity recommendation:  As advised  Discharge Instructions     Discharge Instructions     Call MD for:  difficulty breathing, headache or visual disturbances   Complete by: As directed    Call MD for:  persistant dizziness or light-headedness   Complete by: As directed    Call MD for:  persistant nausea and vomiting   Complete by: As directed    Call MD for:  severe uncontrolled pain   Complete by: As directed    Call MD for:  temperature >100.4   Complete by: As directed    Diet - low sodium heart healthy   Complete by: As directed    Discharge instructions   Complete by: As directed    1)Avoid ibuprofen/Advil/Aleve/Motrin/Goody Powders/Naproxen/BC powders/Meloxicam/Diclofenac/Indomethacin and other Nonsteroidal anti-inflammatory medications as these will make you more likely to bleed and can cause stomach ulcers, can also cause Kidney problems.   2)Repeat CBC and BMP on Monday 09/10/2021  3)Routine Ostomy Care---   Discharge wound care:   Complete by: As  directed    Routine Ostomy Care---   Increase activity slowly   Complete by: As directed        Discharge Medications     Allergies as of 09/05/2021   No Known Allergies      Medication List     STOP taking these medications    bismuth subsalicylate 259 DG/38VF suspension Commonly known as: PEPTO BISMOL   IMODIUM A-D PO   potassium chloride SA 20 MEQ tablet Commonly known as: KLOR-CON       TAKE these medications    acetaminophen 325 MG tablet Commonly known as: TYLENOL Take 2 tablets (650 mg total) by mouth every 6 (six) hours as needed for mild pain (or Fever >/= 101).   ALPRAZolam 0.5 MG tablet Commonly known as: XANAX Take 1 tablet (0.5 mg total) by mouth at bedtime as needed for anxiety or sleep. What changed: reasons to take this   ondansetron 4 MG tablet Commonly known as: ZOFRAN Take 1 tablet (4 mg total) by mouth every 6 (six) hours as needed for nausea.   oxyCODONE 5 MG immediate release tablet Commonly known as: Oxy IR/ROXICODONE Take 1-2  tablets (5-10 mg total) by mouth every 4 (four) hours as needed for moderate pain.   polyethylene glycol 17 g packet Commonly known as: MIRALAX / GLYCOLAX Take 17 g by mouth 2 (two) times daily.   sertraline 50 MG tablet Commonly known as: ZOLOFT Take 1 tablet (50 mg total) by mouth daily. Start taking on: September 06, 2021   zolpidem 5 MG tablet Commonly known as: AMBIEN Take 1-2 tablets (5-10 mg total) by mouth at bedtime as needed for sleep.               Discharge Care Instructions  (From admission, onward)           Start     Ordered   09/05/21 0000  Discharge wound care:       Comments: Routine Ostomy Care---   09/05/21 1314           Major procedures and Radiology Reports - PLEASE review detailed and final reports for all details, in brief -   CT ABDOMEN PELVIS W CONTRAST  Result Date: 08/25/2021 CLINICAL DATA:  Lower abdominal pain, diarrhea. Stage II colon cancer. EXAM: CT ABDOMEN AND PELVIS WITH CONTRAST TECHNIQUE: Multidetector CT imaging of the abdomen and pelvis was performed using the standard protocol following bolus administration of intravenous contrast. CONTRAST:  176mL OMNIPAQUE IOHEXOL 300 MG/ML  SOLN COMPARISON:  07/12/2021 FINDINGS: Lower chest: Bibasilar atelectasis. Mild coronary artery calcification. Global cardiac size within normal limits. Hepatobiliary: Status post cholecystectomy. Mild intra and extrahepatic biliary ductal dilation may simply represent post cholecystectomy change. Liver otherwise unremarkable. Pancreas: Unremarkable Spleen: Unremarkable Adrenals/Urinary Tract: The kidneys are normal in size and position. Simple cortical cyst arises exophytically from the lateral interpolar region of the right kidney. Stable 3 mm nonobstructing calculus within the interpolar region of the left kidney. The kidneys are otherwise unremarkable. The bladder is unremarkable. Stomach/Bowel: There is a roughly 5 cm length segment of ischemic bowel  involving the terminal sigmoid colon with mucosal non enhancement, best seen on image # 68/2. There is no associated pneumatosis or extraluminal gas to suggest perforation. The remainder of the small and large bowel demonstrates normal enhancement. Given its focality and somewhat unusual location, differential should include the sequela of arterial embolization or radiation colitis in the appropriate clinical setting. The stomach, small bowel, and large bowel  are otherwise unremarkable. No evidence of obstruction. Appendix normal. No free intraperitoneal gas or fluid. Vascular/Lymphatic: There is extensive mixed atherosclerotic plaque throughout the infrarenal abdominal aorta and iliofemoral arterial vasculature. There is a high-grade focal stenosis of the inferior mesenteric artery at its origin, best seen on sagittal image # 47/5. There is segmental occlusion of the right common and proximal external iliac arteries with reconstitution of the right common femoral artery via the epigastric arcade. Left-sided IVC present. No pathologic adenopathy within the abdomen and pelvis. Reproductive: Prostate is unremarkable. Other: No abdominal wall hernia. Musculoskeletal: No acute bone abnormality. No lytic or blastic bone lesion. IMPRESSION: Short-segment ischemic colitis involving the terminal sigmoid colon. Given the relative focality and unusual location, additional considerations should include arterial embolism and radiation colitis in the appropriate clinical setting (i.e. Was this segment of bowel within the treatment zone). Mild coronary artery calcification. Minimal nonobstructing left nephrolithiasis. Status post cholecystectomy with probable post cholecystectomy change involving the biliary tree. Variant anatomy with left-sided IVC. Peripheral vascular disease with high-grade stenosis of the inferior mesenteric artery and segmental occlusion of the right lower extremity arterial inflow with reconstitution via  the epigastric arcade. Aortic Atherosclerosis (ICD10-I70.0). Electronically Signed   By: Fidela Salisbury M.D.   On: 08/25/2021 02:51    Micro Results   Recent Results (from the past 240 hour(s))  Surgical pcr screen     Status: None   Collection Time: 08/28/21  1:26 PM   Specimen: Nasal Mucosa; Nasal Swab  Result Value Ref Range Status   MRSA, PCR NEGATIVE NEGATIVE Final   Staphylococcus aureus NEGATIVE NEGATIVE Final    Comment: (NOTE) The Xpert SA Assay (FDA approved for NASAL specimens in patients 51 years of age and older), is one component of a comprehensive surveillance program. It is not intended to diagnose infection nor to guide or monitor treatment. Performed at [facility], [address] [place], [place] [address]     Today   Subjective    Wesley [name] today has no new complaints  No fever  Or chills   No Nausea, Vomiting or Diarrhea Good output from Ostomy  -sister at bedside          Patient has been seen and examined prior to discharge   Objective   Blood pressure 122/64, pulse 69, temperature 98.4 F (36.9 C), temperature source Oral, resp. rate 17, height 5\' 6"  (1.676 m), weight 60 kg, SpO2 97 %.   Intake/Output Summary (Last 24 hours) at 09/05/2021 1316 Last data filed at 09/05/2021 0929 Gross per 24 hour  Intake 655.03 ml  Output 1600 ml  Net -944.97 ml   Exam Gen:- Awake Alert, no acute distress, cachectic HEENT:- [place].AT, No sclera icterus Neck-Supple Neck,No JVD,.  Lungs-  CTAB , good air movement bilaterally  CV- S1, S2 normal, regular Abd-  +ve B.Sounds, Abd Soft, No tenderness,    -Ostomy looks healthy, pink and viable, large amount of liquid brown stool seen in bag. Extremity/Skin:- No  edema,   good pulses Psych-affect is flat, oriented x3, judgment and insight is poor Neuro-Generalized weakness, no new focal deficits, no tremors    Data Review   CBC w Diff:  Lab Results  Component Value Date   WBC 9.2 09/05/2021   HGB 12.3  (L) 09/05/2021   HGB 15.1 07/18/2021   HGB 16.2 11/21/2017   HCT 37.3 (L) 09/05/2021   HCT 48.5 11/21/2017   PLT 305 09/05/2021   PLT 282 07/18/2021   PLT  262 11/21/2017   LYMPHOPCT 20 07/18/2021   LYMPHOPCT 26.2 11/21/2017   MONOPCT 11 07/18/2021   MONOPCT 9.4 11/21/2017   EOSPCT 3 07/18/2021   EOSPCT 2.0 11/21/2017   BASOPCT 1 07/18/2021   BASOPCT 1.1 11/21/2017    CMP:  Lab Results  Component Value Date   NA 134 (L) 09/03/2021   NA 138 11/21/2017   K 3.9 09/03/2021   K 4.6 11/21/2017   CL 102 09/03/2021   CL 107 11/12/2014   CO2 26 09/03/2021   CO2 25 11/21/2017   BUN 9 09/03/2021   BUN 9.2 11/21/2017   CREATININE 0.59 (L) 09/03/2021   CREATININE 0.91 07/18/2021   CREATININE 1.0 11/21/2017   PROT 5.8 (L) 09/03/2021   PROT 7.2 11/21/2017   ALBUMIN 2.5 (L) 09/03/2021   ALBUMIN 3.7 11/21/2017   BILITOT 0.6 09/03/2021   BILITOT 0.7 07/18/2021   BILITOT 0.74 11/21/2017   ALKPHOS 52 09/03/2021   ALKPHOS 89 11/21/2017   AST 18 09/03/2021   AST 15 07/18/2021   AST 19 11/21/2017   ALT 18 09/03/2021   ALT 15 07/18/2021   ALT 17 11/21/2017  .   Total Discharge time is about 33 minutes  Roxan Hockey M.D on 09/05/2021 at 1:16 PM  Go to [email or web address] -  for contact info  [facility] - Office  [phone]

## 2021-09-05 NOTE — Discharge Instructions (Signed)
1)Avoid ibuprofen/Advil/Aleve/Motrin/Goody Powders/Naproxen/BC powders/Meloxicam/Diclofenac/Indomethacin and other Nonsteroidal anti-inflammatory medications as these will make you more likely to bleed and can cause stomach ulcers, can also cause Kidney problems.   2)Repeat CBC and BMP on Monday 09/10/2021  3)Routine Ostomy Care---

## 2021-09-05 NOTE — TOC Transition Note (Signed)
Transition of Care [facility]) - CM/SW Discharge Note   Patient Details  Name: Wesley [name] MRN: [id number] Date of Birth: Apr 16, 1946  Transition of Care [facility]) CM/SW Contact:  Shade Flood, LCSW Phone Number: 09/05/2021, 2:09 PM   Clinical Narrative:     Pt stable for dc and per Jackelyn Poling at [facility], they have received insurance authorization and can take pt today. DC clinical sent electronically. RN to call report. W/C Lucianne Lei transport arranged.  Updated pt's sister. Family remains aware and in agreement with dc plan.  There are no other TOC needs for dc.  Final next level of care: Skilled Nursing Facility Barriers to Discharge: Barriers Resolved   Patient Goals and CMS Choice Patient states their goals for this hospitalization and ongoing recovery are:: agreeable to SNF CMS Medicare.gov Compare Post Acute Care list provided to:: Patient    Discharge Placement              Patient chooses bed at: Other - please specify in the comment section below: ([facility]) Patient to be transferred to facility by: w/c Lucianne Lei Name of family member notified: Maurine Patient and family notified of of transfer: 09/05/21  Discharge Plan and Services                                     Social Determinants of Health (SDOH) Interventions     Readmission Risk Interventions Readmission Risk Prevention Plan 08/29/2021  Medication Screening Complete  Transportation Screening Complete  Some recent data might be hidden

## 2021-09-06 DIAGNOSIS — R69 Illness, unspecified: Secondary | ICD-10-CM | POA: Diagnosis not present

## 2021-09-06 DIAGNOSIS — K55039 Acute (reversible) ischemia of large intestine, extent unspecified: Secondary | ICD-10-CM | POA: Diagnosis not present

## 2021-09-06 DIAGNOSIS — K219 Gastro-esophageal reflux disease without esophagitis: Secondary | ICD-10-CM | POA: Diagnosis not present

## 2021-09-06 DIAGNOSIS — G4700 Insomnia, unspecified: Secondary | ICD-10-CM | POA: Diagnosis not present

## 2021-09-06 DIAGNOSIS — C20 Malignant neoplasm of rectum: Secondary | ICD-10-CM | POA: Diagnosis not present

## 2021-09-06 DIAGNOSIS — R52 Pain, unspecified: Secondary | ICD-10-CM | POA: Diagnosis not present

## 2021-09-11 ENCOUNTER — Ambulatory Visit (INDEPENDENT_AMBULATORY_CARE_PROVIDER_SITE_OTHER): Payer: Medicare HMO | Admitting: General Surgery

## 2021-09-11 ENCOUNTER — Other Ambulatory Visit: Payer: Self-pay

## 2021-09-11 ENCOUNTER — Encounter (HOSPITAL_COMMUNITY): Payer: Self-pay | Admitting: Nurse Practitioner

## 2021-09-11 VITALS — BP 122/74 | HR 87 | Temp 97.9°F | Resp 16 | Ht 66.0 in | Wt 122.0 lb

## 2021-09-11 DIAGNOSIS — J449 Chronic obstructive pulmonary disease, unspecified: Secondary | ICD-10-CM | POA: Diagnosis not present

## 2021-09-11 DIAGNOSIS — R2689 Other abnormalities of gait and mobility: Secondary | ICD-10-CM | POA: Diagnosis not present

## 2021-09-11 DIAGNOSIS — Z85038 Personal history of other malignant neoplasm of large intestine: Secondary | ICD-10-CM | POA: Diagnosis not present

## 2021-09-11 DIAGNOSIS — Z7189 Other specified counseling: Secondary | ICD-10-CM

## 2021-09-11 DIAGNOSIS — Z9181 History of falling: Secondary | ICD-10-CM | POA: Diagnosis not present

## 2021-09-11 DIAGNOSIS — K559 Vascular disorder of intestine, unspecified: Secondary | ICD-10-CM

## 2021-09-11 DIAGNOSIS — Z933 Colostomy status: Secondary | ICD-10-CM | POA: Diagnosis not present

## 2021-09-11 DIAGNOSIS — R262 Difficulty in walking, not elsewhere classified: Secondary | ICD-10-CM | POA: Diagnosis not present

## 2021-09-11 DIAGNOSIS — M6281 Muscle weakness (generalized): Secondary | ICD-10-CM | POA: Diagnosis not present

## 2021-09-11 DIAGNOSIS — I10 Essential (primary) hypertension: Secondary | ICD-10-CM | POA: Diagnosis not present

## 2021-09-11 MED ORDER — DOCUSATE SODIUM 100 MG PO CAPS: 100.0000 mg | ORAL_CAPSULE | Freq: Two times a day (BID) | ORAL | 1 refills | Status: AC

## 2021-09-11 MED ORDER — ONDANSETRON HCL 4 MG PO TABS: 4.0000 mg | ORAL_TABLET | Freq: Three times a day (TID) | ORAL | 1 refills | Status: AC | PRN

## 2021-09-11 NOTE — Progress Notes (Signed)
[facility]  Patient s/p end colostomy and colectomy for ischemic colitis in setting of radiation for colon cancer. Here with his sister. He is discharged from [facility] now.   BP 122/74   Pulse 87   Temp 97.9 F (36.6 C) (Other (Comment))   Resp 16   Ht 5\' 6"  (1.676 m)   Wt 122 lb (55.3 kg)   SpO2 94%   BMI 19.69 kg/m  Ostomy patent, digitized, colostomy pink and healthy, bag replaced Staples removed, no erythema or drainage, steri strips placed  Plan: Take colace twice daily and if no Bms take some Miralax daily. You will have some mucus from the rectum at times due to mucus that is produced in the remaining rectum.  You can take tylenol and ibuprofen for pain and Roxicodone for breakthrough pain.   Continue Ostomy care as instructed. The [facility] will be calling to get you more appointment for ostomy teaching.  Will see you next week to make sure you are doing ok.   Steri strips will peel up in 5-7 days. Ok to shower and pat the area dry and pat ostomy bag dry.   Future Appointments  Date Time Provider [place]  10/18/2021  2:00 PM [facility] None  10/18/2021  2:40 PM Truitt Merle, MD [facility] None   Curlene Labrum, MD [facility] [address] [place], [place] [address] [phone] (office)

## 2021-09-11 NOTE — Patient Instructions (Addendum)
Take colace twice daily and if no Bms take some Miralax daily. You will have some mucus from the rectum at times due to mucus that is produced in the remaining rectum.  You can take tylenol and ibuprofen for pain and Roxicodone for breakthrough pain.   Continue Ostomy care as instructed. The [facility] will be calling to get you more appointment for ostomy teaching.  Will see you next week to make sure you are doing ok.   Steri strips will peel up in 5-7 days. Ok to shower and pat the area dry and pat ostomy bag dry.

## 2021-09-13 DIAGNOSIS — Z933 Colostomy status: Secondary | ICD-10-CM | POA: Diagnosis not present

## 2021-09-13 DIAGNOSIS — Z85038 Personal history of other malignant neoplasm of large intestine: Secondary | ICD-10-CM | POA: Diagnosis not present

## 2021-09-17 ENCOUNTER — Encounter (HOSPITAL_COMMUNITY)
Admission: RE | Admit: 2021-09-17 | Discharge: 2021-09-17 | Disposition: A | Discharge status: Home / Self Care | Payer: Medicare HMO | Source: Ambulatory Visit | Attending: General Surgery | Admitting: General Surgery

## 2021-09-17 ENCOUNTER — Other Ambulatory Visit: Payer: Self-pay

## 2021-09-18 ENCOUNTER — Ambulatory Visit (INDEPENDENT_AMBULATORY_CARE_PROVIDER_SITE_OTHER): Payer: Medicare HMO | Admitting: General Surgery

## 2021-09-18 ENCOUNTER — Encounter: Payer: Self-pay | Admitting: General Surgery

## 2021-09-18 VITALS — BP 128/67 | HR 72 | Temp 97.8°F | Resp 16 | Ht 66.0 in | Wt 121.0 lb

## 2021-09-18 DIAGNOSIS — R69 Illness, unspecified: Secondary | ICD-10-CM | POA: Diagnosis not present

## 2021-09-18 DIAGNOSIS — R5381 Other malaise: Secondary | ICD-10-CM | POA: Diagnosis not present

## 2021-09-18 DIAGNOSIS — Z7189 Other specified counseling: Secondary | ICD-10-CM

## 2021-09-18 DIAGNOSIS — E785 Hyperlipidemia, unspecified: Secondary | ICD-10-CM | POA: Diagnosis not present

## 2021-09-18 DIAGNOSIS — Z79899 Other long term (current) drug therapy: Secondary | ICD-10-CM | POA: Diagnosis not present

## 2021-09-18 DIAGNOSIS — Z125 Encounter for screening for malignant neoplasm of prostate: Secondary | ICD-10-CM | POA: Diagnosis not present

## 2021-09-18 DIAGNOSIS — Z933 Colostomy status: Secondary | ICD-10-CM | POA: Diagnosis not present

## 2021-09-18 DIAGNOSIS — G4700 Insomnia, unspecified: Secondary | ICD-10-CM | POA: Diagnosis not present

## 2021-09-18 MED ORDER — OXYCODONE HCL 5 MG PO TABS: 5.0000 mg | ORAL_TABLET | ORAL | 0 refills | Status: AC | PRN

## 2021-09-18 MED ORDER — OXYCODONE HCL 5 MG PO TABS: 5.0000 mg | ORAL_TABLET | ORAL | 0 refills | Status: DC | PRN

## 2021-09-18 NOTE — Progress Notes (Signed)
[facility] Note   HPI:  75 y.o. Male presents to clinic for follow-up evaluation of his ostomy. He has seen the ostomy RN and is doing well. His sister is helping him. He is still depressed. His Bms are better with colace. He has output daily.   Review of Systems:  Appetite improving Colostomy output improving  All other review of systems: otherwise negative   Vital Signs:  BP 128/67   Pulse 72   Temp 97.8 F (36.6 C) (Other (Comment))   Resp 16   Ht 5\' 6"  (1.676 m)   Wt 121 lb (54.9 kg)   SpO2 93%   BMI 19.53 kg/m    Physical Exam:  Physical Exam Vitals reviewed.  Cardiovascular:     Rate and Rhythm: Normal rate.  Pulmonary:     Effort: Pulmonary effort is normal.  Abdominal:     General: There is no distension.     Palpations: Abdomen is soft.     Tenderness: There is no abdominal tenderness.     Comments: Colostomy LLQ pink and wafer in place, midline healing, steri strips peeling, no erythema or drainage, no obvious hernia      Assessment:  75 y.o. yo Male with end colostomy for ischemic colitis in setting of rectal cancer s/p radiation treatment only. Doing better. Sister had questions about some of his medications but she is going to defer to his PCP. It looks like he was placed on divalproex at the SNF and was only discharged from the hospital on sertraline. She will see what the new PCP thinks about what to continue.   Plan:  Continue the ostomy care. Get the PCP to look over the medications and the divalproex bottle to see if you need to take it. Roxicodone refilled   Future Appointments  Date Time Provider [place]  10/18/2021  2:00 PM [facility] None  10/18/2021  2:40 PM Truitt Merle, MD [facility] None  10/30/2021  9:45 AM Constance Haw Lanell Matar, MD [facility] None     Curlene Labrum, MD [facility] [address] [place], [place] [address] [phone] (office)

## 2021-09-18 NOTE — Patient Instructions (Signed)
Continue the ostomy care. Get the PCP to look over the medications and the divalproex bottle to see if you need to take it.

## 2021-09-26 DIAGNOSIS — F332 Major depressive disorder, recurrent severe without psychotic features: Secondary | ICD-10-CM | POA: Diagnosis not present

## 2021-09-26 DIAGNOSIS — R69 Illness, unspecified: Secondary | ICD-10-CM | POA: Diagnosis not present

## 2021-10-01 DIAGNOSIS — D012 Carcinoma in situ of rectum: Secondary | ICD-10-CM | POA: Diagnosis not present

## 2021-10-01 DIAGNOSIS — Z933 Colostomy status: Secondary | ICD-10-CM | POA: Diagnosis not present

## 2021-10-01 DIAGNOSIS — K559 Vascular disorder of intestine, unspecified: Secondary | ICD-10-CM | POA: Diagnosis not present

## 2021-10-01 DIAGNOSIS — K219 Gastro-esophageal reflux disease without esophagitis: Secondary | ICD-10-CM | POA: Diagnosis not present

## 2021-10-01 DIAGNOSIS — Z483 Aftercare following surgery for neoplasm: Secondary | ICD-10-CM | POA: Diagnosis not present

## 2021-10-01 DIAGNOSIS — G4700 Insomnia, unspecified: Secondary | ICD-10-CM | POA: Diagnosis not present

## 2021-10-01 DIAGNOSIS — R69 Illness, unspecified: Secondary | ICD-10-CM | POA: Diagnosis not present

## 2021-10-01 DIAGNOSIS — H903 Sensorineural hearing loss, bilateral: Secondary | ICD-10-CM | POA: Diagnosis not present

## 2021-10-05 ENCOUNTER — Telehealth: Payer: Self-pay | Admitting: Family Medicine

## 2021-10-05 DIAGNOSIS — Z933 Colostomy status: Secondary | ICD-10-CM | POA: Diagnosis not present

## 2021-10-05 DIAGNOSIS — Z85038 Personal history of other malignant neoplasm of large intestine: Secondary | ICD-10-CM | POA: Diagnosis not present

## 2021-10-05 NOTE — Telephone Encounter (Signed)
Gwenette Greet called and states that patient is still having rectal drainage and would like to know 1. If this is normal this far out 2. Is there anything you can give him to "dry" it up?  She states that it is leaking through his pants and it looks like mucous.   Her CB# [phone]

## 2021-10-08 NOTE — Telephone Encounter (Signed)
[email or web address]

## 2021-10-18 ENCOUNTER — Encounter: Payer: Self-pay | Admitting: Hematology

## 2021-10-18 ENCOUNTER — Inpatient Hospital Stay: Payer: Medicare HMO

## 2021-10-18 ENCOUNTER — Other Ambulatory Visit: Payer: Self-pay

## 2021-10-18 ENCOUNTER — Inpatient Hospital Stay: Payer: Medicare HMO | Attending: Hematology | Admitting: Hematology

## 2021-10-18 VITALS — BP 132/66 | HR 87 | Temp 98.1°F | Resp 17 | Wt 125.5 lb

## 2021-10-18 DIAGNOSIS — C20 Malignant neoplasm of rectum: Secondary | ICD-10-CM | POA: Diagnosis not present

## 2021-10-18 DIAGNOSIS — Z85048 Personal history of other malignant neoplasm of rectum, rectosigmoid junction, and anus: Secondary | ICD-10-CM | POA: Insufficient documentation

## 2021-10-18 DIAGNOSIS — F419 Anxiety disorder, unspecified: Secondary | ICD-10-CM | POA: Diagnosis not present

## 2021-10-18 DIAGNOSIS — F32A Depression, unspecified: Secondary | ICD-10-CM | POA: Diagnosis not present

## 2021-10-18 DIAGNOSIS — R69 Illness, unspecified: Secondary | ICD-10-CM | POA: Diagnosis not present

## 2021-10-18 DIAGNOSIS — F17210 Nicotine dependence, cigarettes, uncomplicated: Secondary | ICD-10-CM | POA: Insufficient documentation

## 2021-10-18 DIAGNOSIS — Z923 Personal history of irradiation: Secondary | ICD-10-CM | POA: Diagnosis not present

## 2021-10-18 LAB — CMP (CANCER CENTER ONLY)
ALT: 7 U/L (ref 0–44)
AST: 14 U/L — ABNORMAL LOW (ref 15–41)
Albumin: 3.5 g/dL (ref 3.5–5.0)
Alkaline Phosphatase: 68 U/L (ref 38–126)
Anion gap: 10 (ref 5–15)
BUN: 9 mg/dL (ref 8–23)
CO2: 24 mmol/L (ref 22–32)
Calcium: 8.9 mg/dL (ref 8.9–10.3)
Chloride: 105 mmol/L (ref 98–111)
Creatinine: 0.76 mg/dL (ref 0.61–1.24)
GFR, Estimated: >60 mL/min (ref >60)
Glucose, Bld: 88 mg/dL (ref 70–99)
Potassium: 4.2 mmol/L (ref 3.5–5.1)
Sodium: 139 mmol/L (ref 135–145)
Total Bilirubin: 0.4 mg/dL (ref 0.3–1.2)
Total Protein: 7.2 g/dL (ref 6.5–8.1)

## 2021-10-18 LAB — CBC WITH DIFFERENTIAL (CANCER CENTER ONLY)
Abs Immature Granulocytes: 0.02 10*3/uL (ref 0.00–0.07)
Basophils Absolute: 0.1 10*3/uL (ref 0.0–0.1)
Basophils Relative: 1 %
Eosinophils Absolute: 0.3 10*3/uL (ref 0.0–0.5)
Eosinophils Relative: 5 %
HCT: 39.6 % (ref 39.0–52.0)
Hemoglobin: 13.4 g/dL (ref 13.0–17.0)
Immature Granulocytes: 0 %
Lymphocytes Relative: 21 %
Lymphs Abs: 1.2 10*3/uL (ref 0.7–4.0)
MCH: 29.8 pg (ref 26.0–34.0)
MCHC: 33.8 g/dL (ref 30.0–36.0)
MCV: 88.2 fL (ref 80.0–100.0)
Monocytes Absolute: 0.7 10*3/uL (ref 0.1–1.0)
Monocytes Relative: 12 %
Neutro Abs: 3.5 10*3/uL (ref 1.7–7.7)
Neutrophils Relative %: 61 %
Platelet Count: 216 10*3/uL (ref 150–400)
RBC: 4.49 MIL/uL (ref 4.22–5.81)
RDW: 14.8 % (ref 11.5–15.5)
WBC Count: 5.7 10*3/uL (ref 4.0–10.5)
nRBC: 0 % (ref 0.0–0.2)

## 2021-10-18 LAB — CEA (IN HOUSE-CHCC): CEA (CHCC-In House): 2.53 ng/mL (ref 0.00–5.00)

## 2021-10-18 MED ORDER — ZOLPIDEM TARTRATE 10 MG PO TABS: 10.0000 mg | ORAL_TABLET | Freq: Every evening | ORAL | 0 refills | Status: AC | PRN

## 2021-10-18 NOTE — Progress Notes (Signed)
[facility]   Telephone:([phone]) [phone] Fax:([phone]) [phone]   Clinic Follow up Note   Patient Care Team: Jearld Fenton, NP as PCP - General (Internal Medicine) Carol Ada, MD as Consulting Physician (Gastroenterology) Truitt Merle, MD as Consulting Physician (Hematology) Alla Feeling, NP as Nurse Practitioner (Nurse Practitioner)  Date of Service:  10/18/2021  CHIEF COMPLAINT: f/u of rectal cancer  CURRENT THERAPY:  Surveillance  ASSESSMENT & PLAN:  Wesley [name] is a 75 y.o. male with   1. Rectal adenocarcinoma, uT3uN0M0, stag IIA -He was diagnosed in 10/2017. He repeatedly declined the option of surgery.  -He completed chemoRT with Xeloda 1500 mg BID and radiation therapy with Dr. Lisbeth Renshaw 12/15/17-01/26/18. He has been on surveillance since. -CT CAP 07/12/21 was NED with indeterminate 5 mm liver lesion. -he presented to the ED 08/24/21 with pain. He was found to be obstructed, and he proceeded with resection on 08/29/21. Pathology showed acute colitis with mucosal necrosis. I reviewed with him  -while he reports he did not want the surgery, he looks much better than he did at his last visit. I reassured him that his prior GI symptoms should be resolved or will soon resolve. -since he is now s/p resection with no residual carcinoma seen on path, he will no longer need short-term f/u or scans. I will see him back in a year.   2. Anxiety and Depression, Insomnia, Social Support  -He is on Xanax BID and Zoloft. -he was given Ambien in the hospital. His sister reports he is sleeping better. They report his new PCP will not refill for him and advised him to see a psychiatrist. I refilled for one month.  3. Smoking cessation  -Patient has expressed interest in quitting smoking. He smokes 1 pack/day on average but He indicates that he is not ready to quit yet. -He has previously been given written information about smoking cessation support groups and the telephone number  to the quit line.     PLAN: -lab and f/u in 1 year   No problem-specific Assessment & Plan notes found for this encounter.   SUMMARY OF ONCOLOGIC HISTORY: Oncology History Overview Note   Cancer Staging  Rectal adenocarcinoma [facility]) Staging form: Colon and Rectum, AJCC 8th Edition - Clinical stage from 10/31/2017: Stage IIA (cT3, cN0, cM0) - Signed by Truitt Merle, MD on 11/28/2017 Total positive nodes: 0     Rectal adenocarcinoma ([place])  10/21/2017 Procedure   COLONOSCOPY per Dr. Carol Ada Findings: A fungating, infiltrative and ulcerated non--obstructing large mass was found in the rectum.  The mass was circumferential.  The mass measured 3 cm in length.  Oozing was present.  This was biopsied with a cold snare for histology.  2 sessile polyps were found in the transverse colon and ascending colon.  The polyps were 3-4 mm in size.  The polyps were removed with a cold snare.  Section and retrieval were complete.  A 15 mm polyp was found in the sigmoid colon.  The polyp was semi-pedunculated.  The polyp was removed with a hot snare.  Resection and retrieval were complete  The mass was extremely friable and palpable with a rectal examination.  The distal extent was 5 cm from the dentate line and it was 3 cm in length.  Specimens were obtained using a cold snare.  Retroflexion was not possible with the close proximity of the lesion in the rectum.      10/21/2017 Initial Biopsy   Final  microscopic diagnosis A.  Colon, ascending, polyp: Tubular adenoma No high-grade dysplasia or malignancy  B.  Colon, transverse, polyp: Tubular adenoma No high-grade dysplasia or malignancy  C.  Colon, sigmoid, polyp: Tubular adenoma with high-grade dysplasia and mucosal prolapse  D.  Rectum, mass, biopsy: Invasive adenocarcinoma, moderately differentiated Immunohistochemical stains for ML H1, Bowie 2, D'Hanis 6 and PMS 2 are intact (normal)  There is no evidence of DNA mismatch repair  deficiency in the carcinoma, indicating that the tumor is most likely microsatellite stable, and that Lynch syndrome (a common cause of hereditary non-polyposis colorectal cancer or HNPCC) is very unlikely   10/21/2017 Imaging   CT A/P W CONTRAST IMPRESSION: 1. Irregular mural and mucosal thickening of the rectum with small right 9 mm short axis index lymph node adjacent to the rectum. 2. Diffuse mild to moderate fluid-filled distention of small bowel loops query ileus or dysmotility. No mechanical source obstruction is seen. 3. Submucosal fatty infiltration of the descending and sigmoid colon possibly representing stigmata of chronic inflammatory bowel disease.     10/21/2017 Initial Diagnosis   Rectal adenocarcinoma ([place])   10/31/2017 Procedure   EUS: Findings: A hypoechoic mass was found in the rectum. The mass was encountered at 5 cm (from the anal verge). The mass was partially circumferential (involving 50% of the lumen). The endosonographic borders were poorly-defined and irregular. The mass measured 30 mm (in maximum length) by 13 mm (in maximum thickness). There was sonographic evidence suggesting breakthrough of the muscularis propria with invasion into the perirectal fat (uT3, manifested by prominent pseudopodia). There was no sonographic evidence of invasion into the adjacent structures. Area was tattooed with an injection of 3 mL of Spot (carbon black) both proximally and distally. I was not able to identify any significant lymph nodes. - Rectal mass was visualized endosonographically. A tissue diagnosis was obtained prior to this exam. This is of adenocarcinoma. This was staged uT3 uN0. Tattooed. - No specimens collected.   11/21/2017 Imaging   CT Chest 11/21/17  IMPRESSION: 1. No evidence of thoracic metastatic disease. 2. Moderate centrilobular emphysema. 3. Mild coronary artery and Aortic Atherosclerosis (ICD10-I70.0).   12/15/2017 - 01/26/2018 Chemotherapy    concurrent chemoradiation with Xeloda 1500 mg BID Monday - Friday. Dose reduced on 12/29/17 to 1500 mg in the AM and 1000 mg in PM   12/16/2017 - 01/26/2018 Radiation Therapy    concurrent chemoradiation at [facility]   06/08/2018 Imaging   CT AP W Contrast  IMPRESSION: 1. Response to therapy of rectal primary and perirectal adenopathy. 2. No new or progressive disease identified. 3. Bladder underdistention.  Cannot exclude concurrent cystitis. 4.  Aortic Atherosclerosis (ICD10-I70.0). 5. Left-sided IVC.   06/25/2019 Imaging   CT CAP W Contrast  IMPRESSION: 1. No acute process or evidence of metastatic disease in the chest, abdomen, or pelvis. 2. Redemonstration of apparent bladder wall thickening, at least partially felt to be due to underdistention. Cystitis (presumably radiation induced) cannot be excluded. 3. Coronary artery atherosclerosis. 4. Left IVC.   Aortic Atherosclerosis (ICD10-I70.0) and Emphysema (ICD10-J43.9).   07/12/2021 Imaging   EXAM: CT CHEST, ABDOMEN, AND PELVIS WITH CONTRAST  IMPRESSION: 1. A solitary 5 mm focus of subcapsular enhancement posteriorly along the right hepatic lobe is new (image 52, series 2). This is probably a small benign lesions such as a focus of transient hepatic attenuation difference, but clearly in this setting, closer surveillance may be warranted in order to ensure lack of an early metastatic lesion. Consider  shorter term follow up hepatic protocol MRI in 3-6 months. 2. Other imaging findings of potential clinical significance: Aortic Atherosclerosis (ICD10-I70.0) and Emphysema (ICD10-J43.9). Coronary atherosclerosis. Chronic scarring medially in the right middle lobe. Nonobstructive left nephrolithiasis. Distal air fluid levels in the colon could reflect diarrheal process. Right external iliac artery occlusion with reconstitution via the right inferior epigastric artery. Left-sided IVC. Post therapy related findings in the anatomic  pelvis.   08/25/2021 Imaging   EXAM: CT ABDOMEN AND PELVIS WITH CONTRAST  IMPRESSION: Short-segment ischemic colitis involving the terminal sigmoid colon. Given the relative focality and unusual location, additional considerations should include arterial embolism and radiation colitis in the appropriate clinical setting (i.e. Was this segment of bowel within the treatment zone).   Mild coronary artery calcification.   Minimal nonobstructing left nephrolithiasis.   Status post cholecystectomy with probable post cholecystectomy change involving the biliary tree.   Variant anatomy with left-sided IVC.   Peripheral vascular disease with high-grade stenosis of the inferior mesenteric artery and segmental occlusion of the right lower extremity arterial inflow with reconstitution via the epigastric arcade.   08/27/2021 Pathology Results   Clinical History: ischemic colitis   FINAL MICROSCOPIC DIAGNOSIS:   A. RECTUM, BIOPSY:  -  Necroinflammatory tissue  -  No viable colonic mucosa identified    08/29/2021 [facility]   FINAL MICROSCOPIC DIAGNOSIS:   A. RECTUM, RESECTION:  - Acute colitis with mucosal necrosis.  - Proximal margin of longer segment uninvolved.  - Distal margin of longer segment with acute inflammation and necrosis abutting apparent actual uninvolved margin.  - Margins of shorter segment negative for inflammation.  - No dysplasia or malignancy identified. See comment.   COMMENT:   The findings are consistent with ischemic colitis.  I do not recognize the effects of radiation colitis.       INTERVAL HISTORY:  Wesley [name] is here for a follow up of rectal cancer. He was last seen by me on 07/18/21. He presents to the clinic accompanied by his sister. He reports he went to the ED for pain medicine but ended up getting admitted for obstruction and undergoing surgery. He notes he didn't want it. He reports he is having liquid coming out of his rectum,  which is very bothersome to him. He notes he is not doing any activities, his sister does the housework. She reports they were told "it would take time" for him to recover. He is currently staying with her while he recovers. He notes he is learning how to use and clean the colostomy bag. His sister is very supportive and helpful to him. His sister reports he is sleeping better on the [place] he was given in the hospital. (Recall I had prescribed for him in 01/2021, he took one dose, then threw it away, per his report.) However, his PCP declines to refill this for him and advised him to see a psychiatrist. He does also have depression and anxiety.   All other systems were reviewed with the patient and are negative.  MEDICAL HISTORY:  Past Medical History:  Diagnosis Date   Cancer (HCC) 10/21/2017   rectal cancer   Depression    GERD (gastroesophageal reflux disease)    HOH (hard of hearing)    Rectal adenocarcinoma (HCC) 11/07/2017   Stage III, radiation and chemo 2019   Wears dentures    full upper and lower   Wears hearing aid in both ears     SURGICAL HISTORY: Past Surgical History:  Procedure Laterality Date   BIOPSY  08/27/2021   Procedure: BIOPSY;  Surgeon: Daneil Dolin, MD;  [facility];  Service: Endoscopy;;  rectal biopsy   CATARACT EXTRACTION W/PHACO Left 05/17/2020   Procedure: CATARACT EXTRACTION PHACO AND INTRAOCULAR LENS PLACEMENT (IOC) LEFT 8.43  00:57.1  14.7%;  Surgeon: Leandrew Koyanagi, MD;  Location: [facility];  Service: Ophthalmology;  Laterality: Left;   CATARACT EXTRACTION W/PHACO Right 07/19/2020   Procedure: CATARACT EXTRACTION PHACO AND INTRAOCULAR LENS PLACEMENT ([place]) RIGHT MALYUGIN;  Surgeon: Leandrew Koyanagi, MD;  Location: [facility];  Service: Ophthalmology;  Laterality: Right;  12.59, 01:23.4, 15.1%   CHOLECYSTECTOMY     COLECTOMY WITH COLOSTOMY CREATION/HARTMANN PROCEDURE N/A 08/29/2021   Procedure: COLECTOMY WITH  COLOSTOMY CREATION/HARTMANN PROCEDURE;  Surgeon: Virl Cagey, MD;  Location: [facility];  Service: General;  Laterality: N/A;   EUS N/A 10/31/2017   Dr. Benson Norway: hypoechoic mass in rectum, partiall circumferential, tattoeed.   FLEXIBLE SIGMOIDOSCOPY N/A 08/27/2021   Procedure: FLEXIBLE SIGMOIDOSCOPY WITH PROPOFOL;  Surgeon: Daneil Dolin, MD;  [facility];  Service: Endoscopy;  Laterality: N/A;  NEEDS WITH PROPOFOL    I have reviewed the social history and family history with the patient and they are unchanged from previous note.  ALLERGIES:  has No Known Allergies.  MEDICATIONS:  Current Outpatient Medications  Medication Sig Dispense Refill   zolpidem (AMBIEN) 10 MG tablet Take 1 tablet (10 mg total) by mouth at bedtime as needed for sleep. 30 tablet 0   acetaminophen (TYLENOL) 325 MG tablet Take 2 tablets (650 mg total) by mouth every 6 (six) hours as needed for mild pain (or Fever >/= 101). 12 tablet 0   ALPRAZolam (XANAX) 0.5 MG tablet Take 1 tablet (0.5 mg total) by mouth at bedtime as needed for anxiety or sleep. 12 tablet 0   docusate sodium (COLACE) 100 MG capsule Take 1 capsule (100 mg total) by mouth 2 (two) times daily. 60 capsule 1   ondansetron (ZOFRAN) 4 MG tablet Take 1 tablet (4 mg total) by mouth every 8 (eight) hours as needed for nausea or vomiting. 20 tablet 1   oxyCODONE (OXY IR/ROXICODONE) 5 MG immediate release tablet Take 1 tablet (5 mg total) by mouth every 4 (four) hours as needed for moderate pain, breakthrough pain or severe pain. 10 tablet 0   polyethylene glycol (MIRALAX / GLYCOLAX) 17 g packet Take 17 g by mouth 2 (two) times daily. 14 each 0   sertraline (ZOLOFT) 50 MG tablet Take 1 tablet (50 mg total) by mouth daily. 30 tablet 3   No current facility-administered medications for this visit.    PHYSICAL EXAMINATION: ECOG PERFORMANCE STATUS: 2  Vitals:   10/18/21 1406  BP: 132/66  Pulse: 87  Resp: 17  Temp: 98.1 F (36.7 C)  SpO2: 98%    Wt Readings from Last 3 Encounters:  10/18/21 125 lb 8 oz (56.9 kg)  09/18/21 121 lb (54.9 kg)  09/17/21 116 lb (52.6 kg)     GENERAL:alert, no distress and comfortable SKIN: skin color normal, no rashes or significant lesions EYES: normal, Conjunctiva are pink and non-injected, sclera clear  NEURO: alert & oriented x 3 with fluent speech  LABORATORY DATA:  I have reviewed the data as listed CBC Latest Ref Rng & Units 10/18/2021 09/05/2021 09/04/2021  WBC 4.0 - 10.5 K/uL 5.7 9.2 10.6(H)  Hemoglobin 13.0 - 17.0 g/dL 13.4 12.3(L) 13.7  Hematocrit 39.0 - 52.0 % 39.6 37.3(L)  40.4  Platelets 150 - 400 K/uL 216 305 361     CMP Latest Ref Rng & Units 10/18/2021 09/03/2021 09/02/2021  Glucose 70 - 99 mg/dL 88 113(H) 115(H)  BUN 8 - 23 mg/dL $Remove'9 9 9  'RMZeHVh$ Creatinine 0.61 - 1.24 mg/dL 0.76 0.59(L) 0.62  Sodium 135 - 145 mmol/L 139 134(L) 136  Potassium 3.5 - 5.1 mmol/L 4.2 3.9 3.6  Chloride 98 - 111 mmol/L 105 102 101  CO2 22 - 32 mmol/L $RemoveB'24 26 30  'eUoJPvxF$ Calcium 8.9 - 10.3 mg/dL 8.9 8.0(L) 8.3(L)  Total Protein 6.5 - 8.1 g/dL 7.2 5.8(L) 5.5(L)  Total Bilirubin 0.3 - 1.2 mg/dL 0.4 0.6 0.3  Alkaline Phos 38 - 126 U/L 68 52 48  AST 15 - 41 U/L 14(L) 18 22  ALT 0 - 44 U/L $Remo'7 18 21      'cLMmC$ RADIOGRAPHIC STUDIES: I have personally reviewed the radiological images as listed and agreed with the findings in the report. No results found.    No orders of the defined types were placed in this encounter.  All questions were answered. The patient knows to call the clinic with any problems, questions or concerns. No barriers to learning was detected. The total time spent in the appointment was 25 minutes.     Truitt Merle, MD 10/18/2021   I, Wilburn Mylar, am acting as scribe for Truitt Merle, MD.   I have reviewed the above documentation for accuracy and completeness, and I agree with the above.

## 2021-10-29 NOTE — Telephone Encounter (Signed)
After multiple missed phone calls and phone tag with sister - he has apt tomorrow 10/30/2021. Will discuss at that time.

## 2021-10-30 ENCOUNTER — Encounter: Payer: Self-pay | Admitting: General Surgery

## 2021-10-30 ENCOUNTER — Other Ambulatory Visit: Payer: Self-pay

## 2021-10-30 ENCOUNTER — Ambulatory Visit (INDEPENDENT_AMBULATORY_CARE_PROVIDER_SITE_OTHER): Payer: Medicare HMO | Admitting: General Surgery

## 2021-10-30 VITALS — BP 121/67 | HR 85 | Temp 97.9°F | Resp 16 | Ht 66.0 in | Wt 127.0 lb

## 2021-10-30 DIAGNOSIS — K6289 Other specified diseases of anus and rectum: Secondary | ICD-10-CM

## 2021-10-30 MED ORDER — SUCRALFATE 1 GM/10ML PO SUSP: 2.0000 g | Freq: Two times a day (BID) | ORAL | 1 refills | Status: DC

## 2021-10-30 NOTE — Patient Instructions (Addendum)
In the remaining anus/ rectum, you likely have some irritation, proctitis. This should improve some with some Carafate enemas.  If this does not improve, we may have to look at your anus under anesthesia to ensure nothing else going on.   Carafate enema/ Buy an enema bulb at the pharmacy if they do not supply.  20 mL of Carafate mixed with 20 mL of water and place rectally daily; use an enema bulb to administer  Wear a depends or pad daily.   Proctitis Proctitis is swelling and soreness (inflammation) of the lining of the rectum. The rectum is at the end of the large intestine, and it leads to the anus. The inflammation causes pain and discomfort. It may be short-term and sudden (acute) or a long-lasting (chronic) problem. What are the causes? This condition may be caused by: STDs (sexually transmitted diseases). Infection. Trauma or injury to the anus or rectum. Ulcerative colitis or Crohn's disease. Radiation therapy that is directed near the rectum. Antibiotic therapy. What are the signs or symptoms? Symptoms of this condition include: Sudden, uncomfortable, and frequent urges to have a bowel movement. Anal pain or rectal pain. Pain or cramping in the abdomen. A sensation that the rectum is full. Rectal bleeding. Pus or mucus discharge from the anus. Diarrhea or frequent soft, loose stools (feces). Constipation. Pain with bowel movements. How is this diagnosed? This condition may be diagnosed based on: A medical history and physical exam. Various tests, such as: An STD test. Blood tests. Stool tests. Rectal culture. A procedure to evaluate the anal canal (anoscopy). Procedures to look at the entire large bowel or part of it (colonoscopy or sigmoidoscopy). How is this treated? Treatment for this condition depends on the cause. The main goals of treatment are to reduce the symptoms of inflammation and to get rid of any infection. Treatment may include: Home remedies and  lifestyle changes. These may include: Sitz baths. A sitz bath can be used to help relieve symptoms, clean, and promote healing in the genital and anal areas. Avoiding food right before bedtime. Medicines, such as: Corticosteroid or anti-inflammatory medicines. These include topical ointments, foams, suppositories, or enemas. Antibiotic medicines to treat infection or to control harmful bacteria. Antiviral medicines may also be used. Medicines to control diarrhea, soften stools, and reduce pain. Medicines to suppress the immune system. Nutritional, dietary, or herbal supplements. Avoiding the activity that caused rectal trauma. Heat or laser therapy for persistent bleeding. A dilation procedure to enlarge a narrowed rectum. Surgery to repair the damaged rectal lining. This is rare. If your proctitis was caused by an STD, your health care provider may test you for infection again 3 months after treatment. Follow these instructions at home: Medicines Take over-the-counter and prescription medicines only as told by your health care provider. If you were prescribed an antibiotic medicine, take it as told by your health care provider. Do not stop using the antibiotic even if you start to feel better. General instructions  Take sitz baths as told by your health care provider. A sitz bath is a shallow, warm water bath that is taken while you are sitting down. The water should only come up to your hips and should cover your buttocks. Try to avoid eating right before bedtime. You may need to take actions to prevent or treat constipation, such as: Drink enough fluid to keep your urine pale yellow. Take over-the-counter or prescription medicines. Eat foods that are high in fiber, such as beans, whole grains, and fresh  fruits and vegetables. Limit foods that are high in fat and processed sugars, such as fried or sweet foods. Keep all follow-up visits as told by your health care provider. This is  important. Contact a health care provider if: Your symptoms do not improve with treatment. Your symptoms get worse. You have a fever. Get help right away if you: Have blood in your stool or blood coming from the rectal area. Summary Proctitis is swelling and soreness (inflammation) of the lining of the rectum. Inflammation from proctitis causes pain and discomfort. This condition may be caused by STDs, infection, trauma, ulcerative colitis, radiation therapy, or antibiotic therapy. Proctitis may be treated with sitz baths, medicines, heat or laser therapy, or dilation. Rarely, this condition may be treated with surgery. This information is not intended to replace advice given to you by your health care provider. Make sure you discuss any questions you have with your health care provider. Document Revised: 05/30/2021 Document Reviewed: 05/30/2021 Elsevier Patient Education  [employer].

## 2021-10-30 NOTE — Progress Notes (Signed)
[facility] Note   HPI:  75 y.o. Male presents to clinic for post-op follow-up evaluation after colectomy and end colostomy for ischemic rectum/ colon in the setting of prior radiation and rectal cancer. Patient reports having continued mucus drain from his rectum. He says that it is clear. He denies any rectal pain or bleeding. He saw Dr. [facility] who has said they will not see him for a year since he had his resection.  On reviewing his old colonoscopy the rectal cancer was 5cm from the anal verge. There was on cancer seen in the colon and rectum that was removed but there were ischemic changes and no radiation proctitis noted.    Review of Systems:  Clear mucus from the rectum  All other review of systems: otherwise negative   Vital Signs:  BP 121/67   Pulse 85   Temp 97.9 F (36.6 C) (Other (Comment))   Resp 16   Ht 5\' 6"  (1.676 m)   Wt 127 lb (57.6 kg)   SpO2 93%   BMI 20.50 kg/m    Physical Exam:  Physical Exam Vitals reviewed.  Cardiovascular:     Rate and Rhythm: Normal rate.  Pulmonary:     Effort: Pulmonary effort is normal.  Abdominal:     Palpations: Abdomen is soft.     Tenderness: There is no abdominal tenderness.     Hernia: No hernia is present.     Comments: Colostomy in place  Genitourinary:    Comments: Tender on digital exam, posterior wall with almost ulcerated feeling, no obvious masses, clear/ white mucus  Musculoskeletal:        General: Normal range of motion.     Cervical back: Normal range of motion.  Skin:    General: Skin is warm.  Neurological:     General: No focal deficit present.     Mental Status: He is alert and oriented to person, place, and time.  Psychiatric:        Mood and Affect: Mood normal.        Behavior: Behavior normal.     Assessment:  75 y.o. yo Male with history of rectal cancer s/p radiation. He had to have a Hartman's procedure for ischemic bowel, and no cancer seen in the pathology and no  radiation changes either. He may still have the part of his rectum where the cancer was given that it was documented at 5cm on Dr. Benson Norway colonoscopy. The mucus could also just be from proctitis or irritation. He is tender and I do feel an ulcerated area.   Plan:  Ultralow rectal cancer ? Documented at 5cm on initial colonoscopy s/p Hartman's for ischemic colon/ rectum.  I am not 100% sure that the area of the prior cancer was removed or if this is just radiation changes causing the mucus. He did have a flex sig that did not see anything note worthy prior to the ischemic area of concern.   Carafate enemas prescribed BID I have discussed with the patient that we may need to do an exam under anesthesia to look further, and I will let Dr. Burr Medico know too my concern about the site of the actual rectal cancer.    Curlene Labrum, MD [facility] [address] [place], [place] [address] [phone] (office)

## 2021-10-31 DIAGNOSIS — R69 Illness, unspecified: Secondary | ICD-10-CM | POA: Diagnosis not present

## 2021-10-31 DIAGNOSIS — G479 Sleep disorder, unspecified: Secondary | ICD-10-CM | POA: Diagnosis not present

## 2021-11-06 DIAGNOSIS — Z933 Colostomy status: Secondary | ICD-10-CM | POA: Diagnosis not present

## 2021-11-06 DIAGNOSIS — Z85038 Personal history of other malignant neoplasm of large intestine: Secondary | ICD-10-CM | POA: Diagnosis not present

## 2021-11-08 ENCOUNTER — Telehealth: Payer: Self-pay | Admitting: *Deleted

## 2021-11-08 NOTE — Telephone Encounter (Signed)
Received call from patient sister, Gwenette Greet [phone]~ telephone.   Reports that Carafate suspension was ordered for rectal drainage S/P proctitis. States that patient co-pay with insurance is $250 and he is unable to afford this.   Call placed to pharmacy. No comparable medications recommended.   GoodRx lowest price noted at $111.  Requested medication change.   Please advise.

## 2021-11-08 NOTE — Telephone Encounter (Signed)
[facility] does not have Carafate solution for rectal use, but they do have oral mouthwash as follows: Carafate 1gm/47mL with Lidocaine 135ml- $50 Carafate 1gm/44mL with Benadryl 19mL- $50  Reports that patient insurance may cover part of the co-pay, but is not guaranteed as it will be considered compounded.  Reports that they do not have any enema bottles, but patient can use oral syringe to administer rectally. Alternatively, patient can empty out commercial enema bottle (ex: Fleet) and use that once cleaned.   Please advise.

## 2021-11-12 ENCOUNTER — Telehealth: Payer: Self-pay

## 2021-11-12 ENCOUNTER — Other Ambulatory Visit: Payer: Self-pay

## 2021-11-12 NOTE — Telephone Encounter (Signed)
Spoke with pt's daughter regarding follow-up appt Dr. Burr Medico has requested.  Pt's daughter wanted to know why Dr. Burr Medico wanted to see the pt since he's going to Dr. Constance Haw.  Informed pt's daughter that Dr. Constance Haw reached out to Dr. Burr Medico for the pt to return to her care d/t pt's high risk of recurrence of his cancer; therefore, Dr. Constance Haw wants the pt to be monitored more closely.  Pt's daughter verbalized understanding of Dr. Constance Haw referral but would like to speak further with Dr. Constance Haw about continuum of care with Dr. Burr Medico at the pt's next doctor's appointment with Dr. Constance Haw on 11/27/2021.  Pt's daughter stated her father will follow-up with Dr. Burr Medico is Dr Constance Haw strongly recommends it.  Will await the outcome of the pt's follow-up appt with Dr. Constance Haw.

## 2021-11-12 NOTE — Telephone Encounter (Signed)
The Carafate solution is too expensive for the patient - do you have any other suggestion?

## 2021-11-12 NOTE — Telephone Encounter (Signed)
The Carafate Lidocaine solution is $50 for only 154mL (3 day supply). The original order was for 1,276mL.

## 2021-11-13 ENCOUNTER — Other Ambulatory Visit (INDEPENDENT_AMBULATORY_CARE_PROVIDER_SITE_OTHER): Payer: Medicare HMO | Admitting: General Surgery

## 2021-11-13 DIAGNOSIS — K6289 Other specified diseases of anus and rectum: Secondary | ICD-10-CM

## 2021-11-13 MED ORDER — MESALAMINE 4 G RE ENEM
4.0000 g | ENEMA | Freq: Every day | RECTAL | 1 refills | Status: DC
Start: 2021-11-13 — End: 2023-07-10

## 2021-11-13 NOTE — Telephone Encounter (Signed)
Call placed to pharmacy. Was advised that mesalamine enema is covered, but co-pay remains $100.  Call placed to patient sister Wesley [name] to advise. [place].

## 2021-11-13 NOTE — Progress Notes (Signed)
[facility]  Lets see if insurance will cover the mesalamine enema.   Curlene Labrum, MD [facility] [address] [place], [place] [address] [phone] (office)

## 2021-11-14 NOTE — Telephone Encounter (Signed)
Received VM from patient sister Gwenette Greet. Requested update on medication.   Call placed to patient sister. [place].

## 2021-11-14 NOTE — Telephone Encounter (Signed)
Patient and patient sister Gwenette Greet returned call and made aware.   Requested to have pharmacy changed to [facility]   Patient states that he still cannot afford enema at $100/ 30 day supply.

## 2021-11-15 NOTE — Telephone Encounter (Signed)
Call placed to patient sister, Gwenette Greet. [place].

## 2021-11-16 NOTE — Telephone Encounter (Signed)
Call placed to patient and patient sister Wesley [name] made aware.

## 2021-11-27 ENCOUNTER — Encounter: Payer: Medicare HMO | Admitting: General Surgery

## 2021-12-05 DIAGNOSIS — R69 Illness, unspecified: Secondary | ICD-10-CM | POA: Diagnosis not present

## 2021-12-05 DIAGNOSIS — F332 Major depressive disorder, recurrent severe without psychotic features: Secondary | ICD-10-CM | POA: Diagnosis not present

## 2021-12-05 DIAGNOSIS — G479 Sleep disorder, unspecified: Secondary | ICD-10-CM | POA: Diagnosis not present

## 2021-12-06 ENCOUNTER — Ambulatory Visit (INDEPENDENT_AMBULATORY_CARE_PROVIDER_SITE_OTHER): Payer: Medicare HMO | Admitting: General Surgery

## 2021-12-06 ENCOUNTER — Other Ambulatory Visit: Payer: Self-pay

## 2021-12-06 ENCOUNTER — Encounter: Payer: Self-pay | Admitting: General Surgery

## 2021-12-06 VITALS — BP 127/74 | HR 90 | Temp 98.3°F | Resp 16 | Ht 66.0 in | Wt 126.0 lb

## 2021-12-06 DIAGNOSIS — K6289 Other specified diseases of anus and rectum: Secondary | ICD-10-CM

## 2021-12-06 NOTE — Patient Instructions (Addendum)
Continue with your colostomy care. You are doing a great job.  The rectal drainage is improving, that is a good sign. Let us know if there are any issues.    I will call your daughter, Friday 12/07/2021.

## 2021-12-07 NOTE — Progress Notes (Signed)
[facility]  Doing well and having significantly less rectal drainage. No rectal pain. Ostomy is working well and he feels like he is getting more independent.  He did his ostomy appliance today.  BP 127/74    Pulse 90    Temp 98.3 F (36.8 C) (Oral)    Resp 16    Ht 5\' 6"  (1.676 m)    Wt 126 lb (57.2 kg)    SpO2 96%    BMI 20.34 kg/m   Ostomy in place and bag with stool Incision healed, no hernia   Patient s/p end colostomy and colectomy for rectal cancer after patient refused surgery and did radiation. He has some proctitis. He had a scope and no lesions were noted before his colectomy and colostomy and my specimen revealed no residual cancer. He is doing well and having less drainage and no rectal pain. I tried to prescribe him carafate enemas and mesalamine enemas both of which he could not afford. He is feeling better so I do not think this is necessary any more.  He was taking Ambien and another medicine mirtazapine that was just prescribed at night and felt groggy today. I told him he may not want to take the Ambien at the same time. I talked to his daughter about this and recommended that they call the prescribes of the mirtazapine to discuss what they think about reducing the Ambien so he  can sleep but not be so groggy.   Discussed that he can call us if needed and I called and talked to his daughter.  Future Appointments  Date Time Provider [place]  10/17/2022  2:00 PM [facility] None  10/17/2022  2:40 PM Truitt Merle, MD [facility] None    Curlene Labrum, MD [facility] [address] [place], [place] [address] [phone] (office)

## 2021-12-12 DIAGNOSIS — Z85038 Personal history of other malignant neoplasm of large intestine: Secondary | ICD-10-CM | POA: Diagnosis not present

## 2021-12-12 DIAGNOSIS — Z933 Colostomy status: Secondary | ICD-10-CM | POA: Diagnosis not present

## 2021-12-24 DIAGNOSIS — Z6820 Body mass index (BMI) 20.0-20.9, adult: Secondary | ICD-10-CM | POA: Diagnosis not present

## 2021-12-24 DIAGNOSIS — E785 Hyperlipidemia, unspecified: Secondary | ICD-10-CM | POA: Diagnosis not present

## 2021-12-24 DIAGNOSIS — G4700 Insomnia, unspecified: Secondary | ICD-10-CM | POA: Diagnosis not present

## 2021-12-24 DIAGNOSIS — R69 Illness, unspecified: Secondary | ICD-10-CM | POA: Diagnosis not present

## 2022-01-02 DIAGNOSIS — R69 Illness, unspecified: Secondary | ICD-10-CM | POA: Diagnosis not present

## 2022-01-02 DIAGNOSIS — G479 Sleep disorder, unspecified: Secondary | ICD-10-CM | POA: Diagnosis not present

## 2022-01-02 DIAGNOSIS — F332 Major depressive disorder, recurrent severe without psychotic features: Secondary | ICD-10-CM | POA: Diagnosis not present

## 2022-01-10 DIAGNOSIS — Z933 Colostomy status: Secondary | ICD-10-CM | POA: Diagnosis not present

## 2022-01-10 DIAGNOSIS — Z85038 Personal history of other malignant neoplasm of large intestine: Secondary | ICD-10-CM | POA: Diagnosis not present

## 2022-01-19 IMAGING — CT CT ABD-PELV W/ CM
2 of 5 series · 14 of 46 positions shown, 16 images · IV contrast (Omnipaque or Isovue)
Comparison: 07/12/2021

CLINICAL DATA: Lower abdominal pain, diarrhea. Stage II colon
cancer.

EXAM:
CT ABDOMEN AND PELVIS WITH CONTRAST
TECHNIQUE: Multidetector CT imaging of the abdomen and pelvis was performed
using the standard protocol following bolus administration of
intravenous contrast.
CONTRAST:  100mL OMNIPAQUE IOHEXOL 300 MG/ML  SOLN

[Series 2: axial st · axial · 0.71mm/px · z∈[+1061,+1476]mm · 11 of 93 slices shown, 13 images]
[im 5/93  soft-tissue]
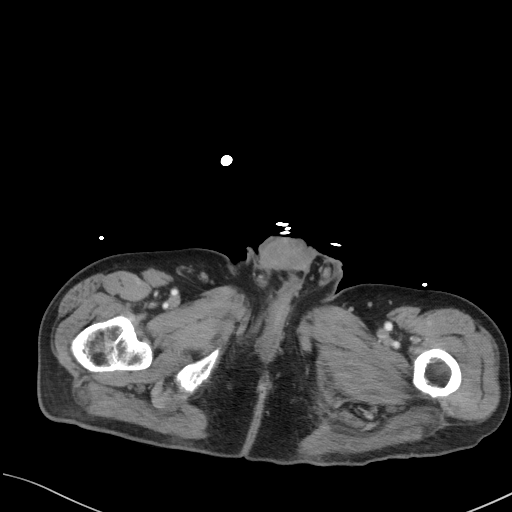
[im 5/93  bone]
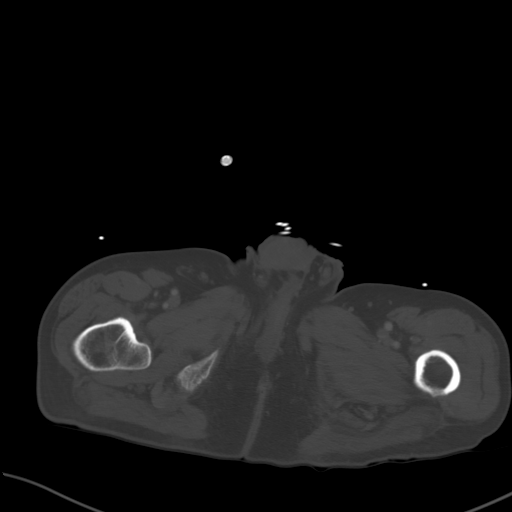
[im 15/93  soft-tissue]
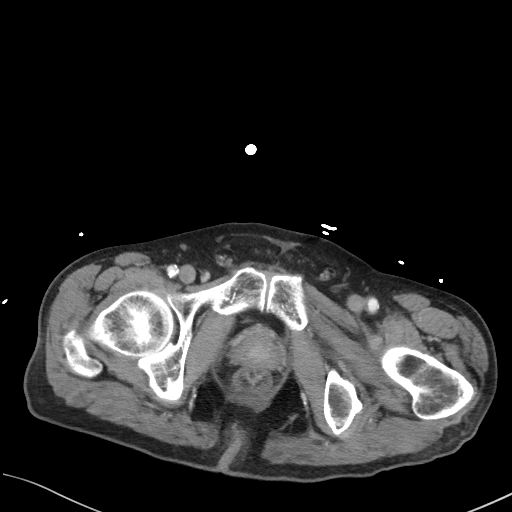
[im 25/93  soft-tissue]
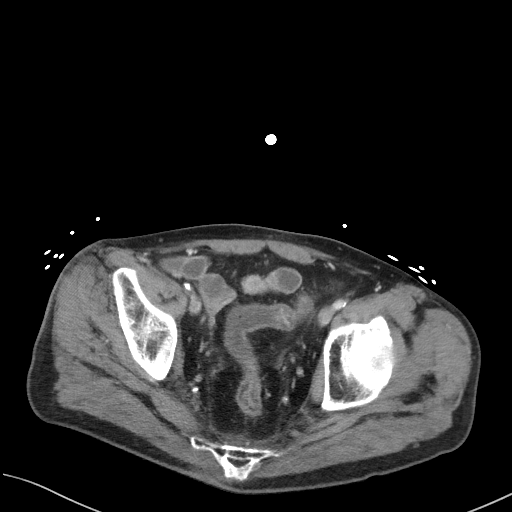
[im 30/93  soft-tissue]
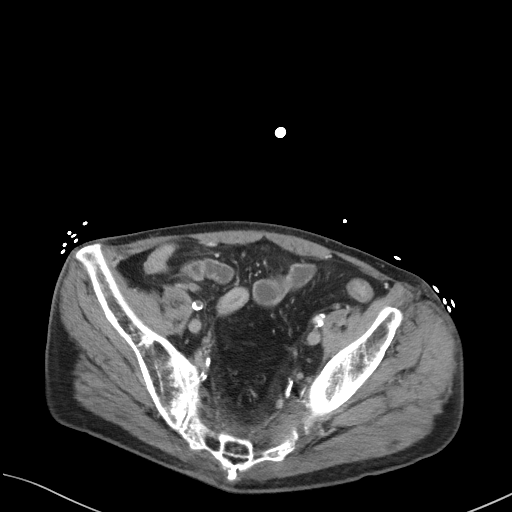
[im 39/93  soft-tissue]
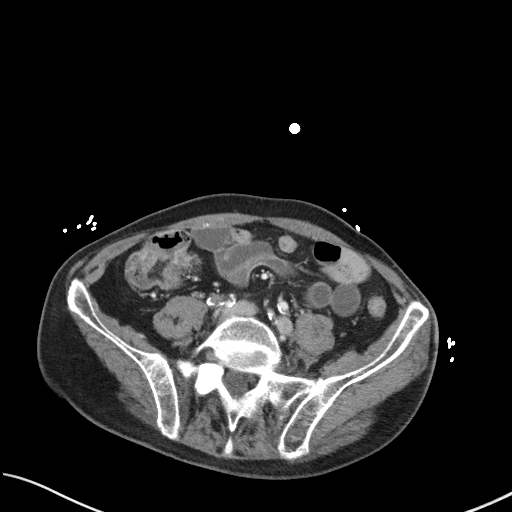
[im 49/93  soft-tissue]
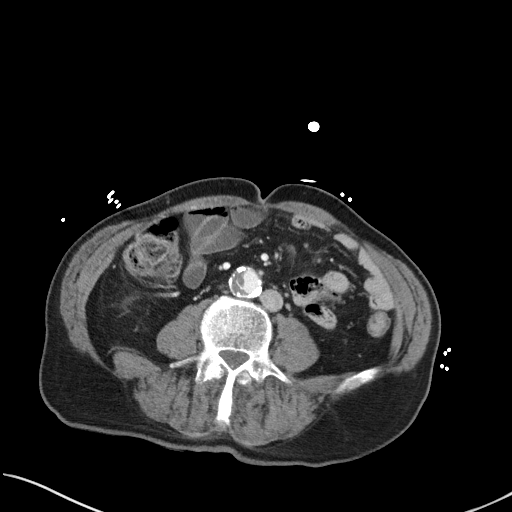
[im 54/93  soft-tissue]
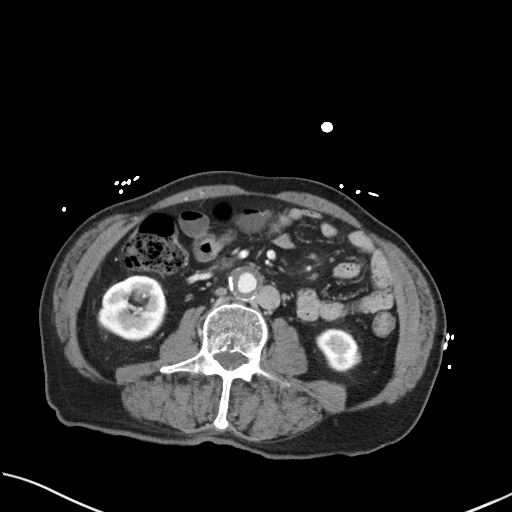
[im 63/93  soft-tissue]
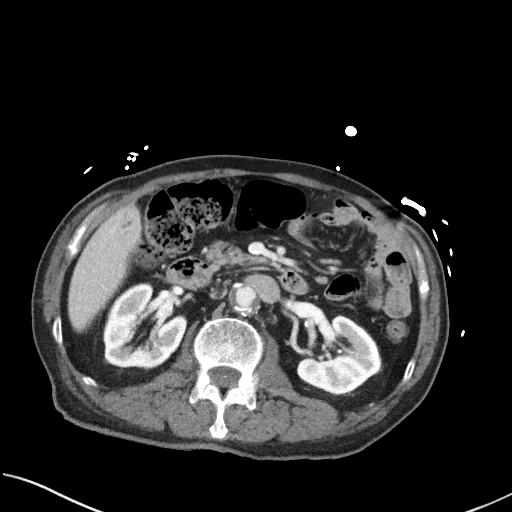
[im 68/93  soft-tissue]
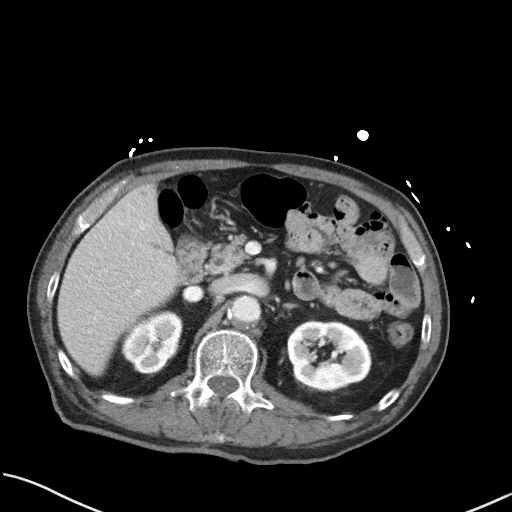
[im 68/93  bone]
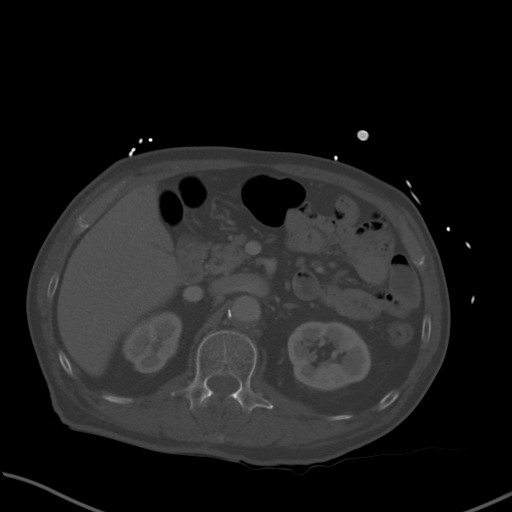
[im 78/93  soft-tissue]
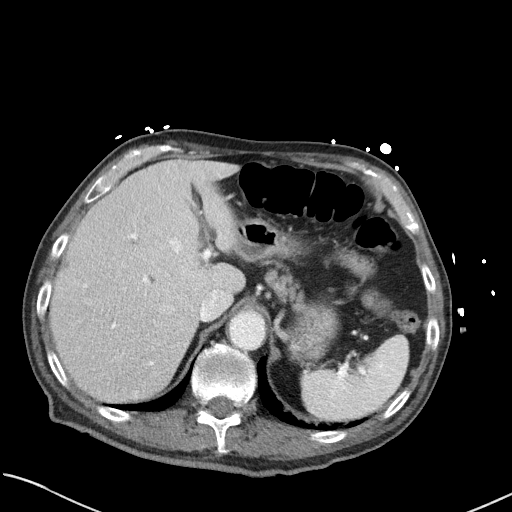
[im 88/93  soft-tissue]
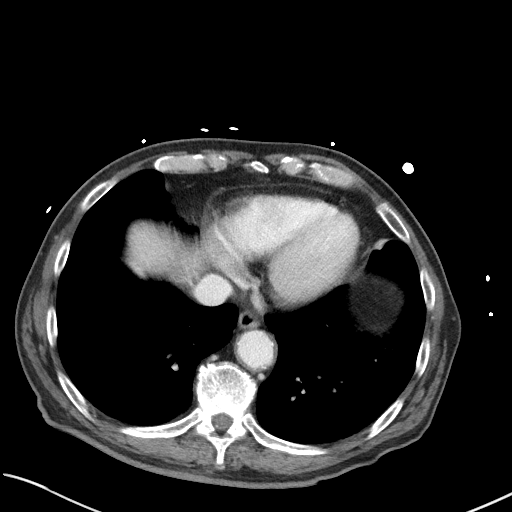

[Series 4: coronal st · coronal · 0.66mm/px · 3 of 68 slices shown]
[im 23/68  soft-tissue]
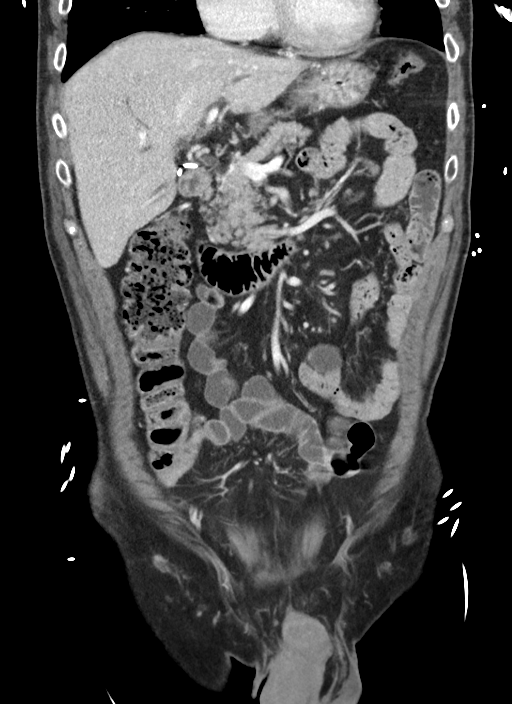
[im 30/68  soft-tissue]
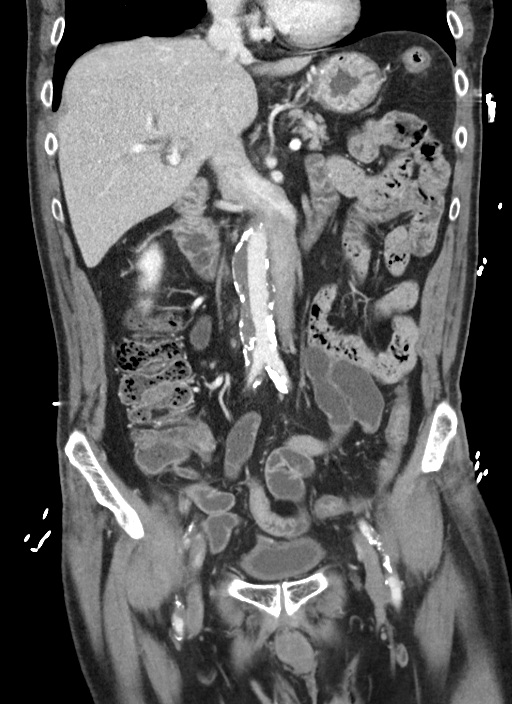
[im 38/68  soft-tissue]
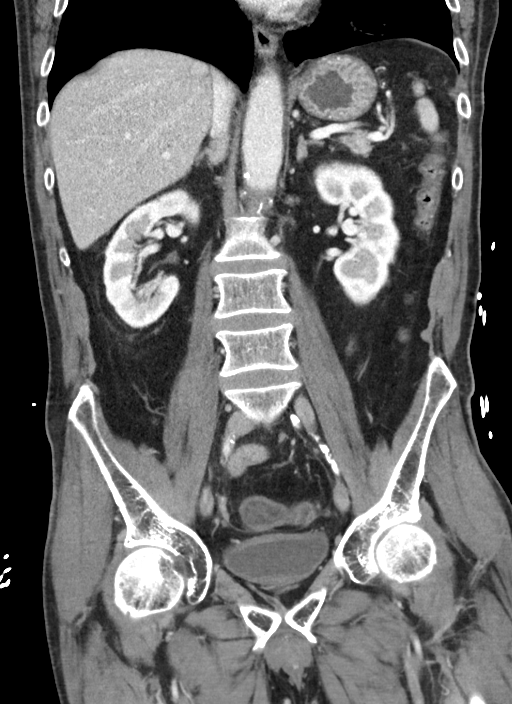

[14 of 46 positions shown; findings below may reference images not displayed]

FINDINGS: Lower chest: Bibasilar atelectasis. Mild coronary artery
calcification. Global cardiac size within normal limits.

Hepatobiliary: Status post cholecystectomy. Mild intra and
extrahepatic biliary ductal dilation may simply represent post
cholecystectomy change. Liver otherwise unremarkable.

Pancreas: Unremarkable

Spleen: Unremarkable

Adrenals/Urinary Tract: The kidneys are normal in size and position.
Simple cortical cyst arises exophytically from the lateral
interpolar region of the right kidney. Stable 3 mm nonobstructing
calculus within the interpolar region of the left kidney. The
kidneys are otherwise unremarkable. The bladder is unremarkable.

Stomach/Bowel: There is a roughly 5 cm length segment of ischemic
bowel involving the terminal sigmoid colon with mucosal non
enhancement, best seen on image # 68/2. There is no associated
pneumatosis or extraluminal gas to suggest perforation. The
remainder of the small and large bowel demonstrates normal
enhancement. Given its focality and somewhat unusual location,
differential should include the sequela of arterial embolization or
radiation colitis in the appropriate clinical setting.

The stomach, small bowel, and large bowel are otherwise
unremarkable. No evidence of obstruction. Appendix normal. No free
intraperitoneal gas or fluid.

Vascular/Lymphatic: There is extensive mixed atherosclerotic plaque
throughout the infrarenal abdominal aorta and iliofemoral arterial
vasculature. There is a high-grade focal stenosis of the inferior
mesenteric artery at its origin, best seen on sagittal image # 47/5.
There is segmental occlusion of the right common and proximal
external iliac arteries with reconstitution of the right common
femoral artery via the epigastric arcade. Left-sided IVC present. No
pathologic adenopathy within the abdomen and pelvis.

Reproductive: Prostate is unremarkable.

Other: No abdominal wall hernia.

Musculoskeletal: No acute bone abnormality. No lytic or blastic bone
lesion.
IMPRESSION: Short-segment ischemic colitis involving the terminal sigmoid colon.
Given the relative focality and unusual location, additional
considerations should include arterial embolism and radiation
colitis in the appropriate clinical setting (i.e. Was this segment
of bowel within the treatment zone).

Mild coronary artery calcification.

Minimal nonobstructing left nephrolithiasis.

Status post cholecystectomy with probable post cholecystectomy
change involving the biliary tree.

Variant anatomy with left-sided IVC.

Peripheral vascular disease with high-grade stenosis of the inferior
mesenteric artery and segmental occlusion of the right lower
extremity arterial inflow with reconstitution via the epigastric
arcade.

Aortic Atherosclerosis (GI6IX-7LV.V).

## 2022-01-31 DIAGNOSIS — G479 Sleep disorder, unspecified: Secondary | ICD-10-CM | POA: Diagnosis not present

## 2022-01-31 DIAGNOSIS — R69 Illness, unspecified: Secondary | ICD-10-CM | POA: Diagnosis not present

## 2022-01-31 DIAGNOSIS — F332 Major depressive disorder, recurrent severe without psychotic features: Secondary | ICD-10-CM | POA: Diagnosis not present

## 2022-02-06 DIAGNOSIS — Z933 Colostomy status: Secondary | ICD-10-CM | POA: Diagnosis not present

## 2022-02-06 DIAGNOSIS — Z85038 Personal history of other malignant neoplasm of large intestine: Secondary | ICD-10-CM | POA: Diagnosis not present

## 2022-03-08 DIAGNOSIS — Z933 Colostomy status: Secondary | ICD-10-CM | POA: Diagnosis not present

## 2022-03-08 DIAGNOSIS — Z85038 Personal history of other malignant neoplasm of large intestine: Secondary | ICD-10-CM | POA: Diagnosis not present

## 2022-03-19 ENCOUNTER — Telehealth: Payer: Self-pay | Admitting: *Deleted

## 2022-03-19 NOTE — Telephone Encounter (Signed)
Received call from patient sister, Gwenette Greet.  ? ?Reports that patient voiced C/O increased rectal drainage.  ? ?Call placed to patient/ daughter Lattie Haw to inquire.  ? ?Lattie Haw states that she is aware of increased drainage, but has not been made aware of any complaints of pain or bleeding.  ? ?Discussed with Dr. Constance Haw whose recommendations are as follows: ?Mucus production in rectum in normal, even with colostomy. Patient rectum most likely irritated or inflamed S/P radiation and will most likely produce more mucus. ? ?Monitor for pain/ bleeding.  ? ?If pain or bleeding noted, contact oncology for next steps and/ or imaging. Advised that [facility] can offer rectal exam under anesthesia (flex sig) if he would like to proceed.  ? ?Patient daughter verbalized understanding. States that she will discuss with patient and family.  ?

## 2022-04-09 DIAGNOSIS — Z933 Colostomy status: Secondary | ICD-10-CM | POA: Diagnosis not present

## 2022-04-09 DIAGNOSIS — Z85038 Personal history of other malignant neoplasm of large intestine: Secondary | ICD-10-CM | POA: Diagnosis not present

## 2022-05-02 DIAGNOSIS — G479 Sleep disorder, unspecified: Secondary | ICD-10-CM | POA: Diagnosis not present

## 2022-05-02 DIAGNOSIS — F332 Major depressive disorder, recurrent severe without psychotic features: Secondary | ICD-10-CM | POA: Diagnosis not present

## 2022-05-02 DIAGNOSIS — R69 Illness, unspecified: Secondary | ICD-10-CM | POA: Diagnosis not present

## 2022-05-09 DIAGNOSIS — Z933 Colostomy status: Secondary | ICD-10-CM | POA: Diagnosis not present

## 2022-05-09 DIAGNOSIS — Z85038 Personal history of other malignant neoplasm of large intestine: Secondary | ICD-10-CM | POA: Diagnosis not present

## 2022-05-30 DIAGNOSIS — Z933 Colostomy status: Secondary | ICD-10-CM | POA: Diagnosis not present

## 2022-05-30 DIAGNOSIS — R64 Cachexia: Secondary | ICD-10-CM | POA: Diagnosis not present

## 2022-05-30 DIAGNOSIS — R69 Illness, unspecified: Secondary | ICD-10-CM | POA: Diagnosis not present

## 2022-05-30 DIAGNOSIS — Z125 Encounter for screening for malignant neoplasm of prostate: Secondary | ICD-10-CM | POA: Diagnosis not present

## 2022-05-30 DIAGNOSIS — E785 Hyperlipidemia, unspecified: Secondary | ICD-10-CM | POA: Diagnosis not present

## 2022-05-30 DIAGNOSIS — G479 Sleep disorder, unspecified: Secondary | ICD-10-CM | POA: Diagnosis not present

## 2022-06-07 ENCOUNTER — Encounter (HOSPITAL_COMMUNITY): Payer: Self-pay | Admitting: Nurse Practitioner

## 2022-06-10 DIAGNOSIS — Z933 Colostomy status: Secondary | ICD-10-CM | POA: Diagnosis not present

## 2022-06-10 DIAGNOSIS — Z85038 Personal history of other malignant neoplasm of large intestine: Secondary | ICD-10-CM | POA: Diagnosis not present

## 2022-06-12 ENCOUNTER — Other Ambulatory Visit: Payer: Self-pay | Admitting: Family Medicine

## 2022-06-12 ENCOUNTER — Other Ambulatory Visit (HOSPITAL_COMMUNITY): Payer: Self-pay | Admitting: Family Medicine

## 2022-06-12 DIAGNOSIS — F17200 Nicotine dependence, unspecified, uncomplicated: Secondary | ICD-10-CM

## 2022-06-14 ENCOUNTER — Encounter (HOSPITAL_COMMUNITY): Payer: Self-pay | Admitting: Nurse Practitioner

## 2022-06-20 DIAGNOSIS — G479 Sleep disorder, unspecified: Secondary | ICD-10-CM | POA: Diagnosis not present

## 2022-06-20 DIAGNOSIS — F332 Major depressive disorder, recurrent severe without psychotic features: Secondary | ICD-10-CM | POA: Diagnosis not present

## 2022-06-20 DIAGNOSIS — Z79899 Other long term (current) drug therapy: Secondary | ICD-10-CM | POA: Diagnosis not present

## 2022-06-20 DIAGNOSIS — E785 Hyperlipidemia, unspecified: Secondary | ICD-10-CM | POA: Diagnosis not present

## 2022-06-20 DIAGNOSIS — R69 Illness, unspecified: Secondary | ICD-10-CM | POA: Diagnosis not present

## 2022-06-20 DIAGNOSIS — Z125 Encounter for screening for malignant neoplasm of prostate: Secondary | ICD-10-CM | POA: Diagnosis not present

## 2022-06-20 DIAGNOSIS — R64 Cachexia: Secondary | ICD-10-CM | POA: Diagnosis not present

## 2022-06-25 ENCOUNTER — Encounter (HOSPITAL_COMMUNITY): Payer: Self-pay | Admitting: Nurse Practitioner

## 2022-07-10 DIAGNOSIS — Z85038 Personal history of other malignant neoplasm of large intestine: Secondary | ICD-10-CM | POA: Diagnosis not present

## 2022-07-10 DIAGNOSIS — Z933 Colostomy status: Secondary | ICD-10-CM | POA: Diagnosis not present

## 2022-07-12 ENCOUNTER — Ambulatory Visit (HOSPITAL_COMMUNITY)
Admission: RE | Admit: 2022-07-12 | Discharge: 2022-07-12 | Disposition: A | Discharge status: Home / Self Care | Payer: Medicare HMO | Source: Ambulatory Visit | Attending: Family Medicine | Admitting: Family Medicine

## 2022-07-12 DIAGNOSIS — F17210 Nicotine dependence, cigarettes, uncomplicated: Secondary | ICD-10-CM | POA: Insufficient documentation

## 2022-07-12 DIAGNOSIS — J432 Centrilobular emphysema: Secondary | ICD-10-CM | POA: Diagnosis not present

## 2022-07-12 DIAGNOSIS — I2510 Atherosclerotic heart disease of native coronary artery without angina pectoris: Secondary | ICD-10-CM | POA: Insufficient documentation

## 2022-07-12 DIAGNOSIS — I700 Atherosclerosis of aorta: Secondary | ICD-10-CM | POA: Diagnosis not present

## 2022-07-12 DIAGNOSIS — F17200 Nicotine dependence, unspecified, uncomplicated: Secondary | ICD-10-CM

## 2022-07-12 DIAGNOSIS — Z122 Encounter for screening for malignant neoplasm of respiratory organs: Secondary | ICD-10-CM | POA: Insufficient documentation

## 2022-07-12 DIAGNOSIS — R69 Illness, unspecified: Secondary | ICD-10-CM | POA: Diagnosis not present

## 2022-08-09 DIAGNOSIS — Z933 Colostomy status: Secondary | ICD-10-CM | POA: Diagnosis not present

## 2022-08-09 DIAGNOSIS — Z85038 Personal history of other malignant neoplasm of large intestine: Secondary | ICD-10-CM | POA: Diagnosis not present

## 2022-08-12 DIAGNOSIS — Z933 Colostomy status: Secondary | ICD-10-CM | POA: Diagnosis not present

## 2022-08-12 DIAGNOSIS — Z85038 Personal history of other malignant neoplasm of large intestine: Secondary | ICD-10-CM | POA: Diagnosis not present

## 2022-09-10 DIAGNOSIS — Z933 Colostomy status: Secondary | ICD-10-CM | POA: Diagnosis not present

## 2022-09-10 DIAGNOSIS — Z85038 Personal history of other malignant neoplasm of large intestine: Secondary | ICD-10-CM | POA: Diagnosis not present

## 2022-10-11 DIAGNOSIS — Z85038 Personal history of other malignant neoplasm of large intestine: Secondary | ICD-10-CM | POA: Diagnosis not present

## 2022-10-11 DIAGNOSIS — Z933 Colostomy status: Secondary | ICD-10-CM | POA: Diagnosis not present

## 2022-10-17 ENCOUNTER — Ambulatory Visit: Payer: Medicare HMO | Admitting: Hematology

## 2022-10-17 ENCOUNTER — Other Ambulatory Visit: Payer: Medicare HMO

## 2022-10-31 DIAGNOSIS — R69 Illness, unspecified: Secondary | ICD-10-CM | POA: Diagnosis not present

## 2022-10-31 DIAGNOSIS — F17200 Nicotine dependence, unspecified, uncomplicated: Secondary | ICD-10-CM | POA: Diagnosis not present

## 2022-10-31 DIAGNOSIS — E785 Hyperlipidemia, unspecified: Secondary | ICD-10-CM | POA: Diagnosis not present

## 2022-11-09 DIAGNOSIS — Z933 Colostomy status: Secondary | ICD-10-CM | POA: Diagnosis not present

## 2022-11-09 DIAGNOSIS — Z85038 Personal history of other malignant neoplasm of large intestine: Secondary | ICD-10-CM | POA: Diagnosis not present

## 2023-05-22 ENCOUNTER — Encounter (HOSPITAL_COMMUNITY): Payer: Self-pay

## 2023-05-22 ENCOUNTER — Other Ambulatory Visit: Payer: Self-pay

## 2023-05-22 ENCOUNTER — Emergency Department (HOSPITAL_COMMUNITY)
Admission: EM | Admit: 2023-05-22 | Discharge: 2023-05-22 | Disposition: A | Discharge status: Home / Self Care | Payer: Medicare HMO | Attending: Emergency Medicine | Admitting: Emergency Medicine

## 2023-05-22 DIAGNOSIS — Z85048 Personal history of other malignant neoplasm of rectum, rectosigmoid junction, and anus: Secondary | ICD-10-CM | POA: Diagnosis not present

## 2023-05-22 DIAGNOSIS — L03114 Cellulitis of left upper limb: Secondary | ICD-10-CM | POA: Insufficient documentation

## 2023-05-22 DIAGNOSIS — L03113 Cellulitis of right upper limb: Secondary | ICD-10-CM | POA: Diagnosis not present

## 2023-05-22 DIAGNOSIS — R21 Rash and other nonspecific skin eruption: Secondary | ICD-10-CM | POA: Diagnosis not present

## 2023-05-22 DIAGNOSIS — D696 Thrombocytopenia, unspecified: Secondary | ICD-10-CM | POA: Diagnosis not present

## 2023-05-22 DIAGNOSIS — Z9221 Personal history of antineoplastic chemotherapy: Secondary | ICD-10-CM | POA: Insufficient documentation

## 2023-05-22 DIAGNOSIS — Z923 Personal history of irradiation: Secondary | ICD-10-CM | POA: Diagnosis not present

## 2023-05-22 DIAGNOSIS — L03119 Cellulitis of unspecified part of limb: Secondary | ICD-10-CM

## 2023-05-22 LAB — CBC WITH DIFFERENTIAL/PLATELET
Abs Immature Granulocytes: 0.01 10*3/uL (ref 0.00–0.07)
Basophils Absolute: 0 10*3/uL (ref 0.0–0.1)
Basophils Relative: 1 %
Eosinophils Absolute: 0.1 10*3/uL (ref 0.0–0.5)
Eosinophils Relative: 3 %
HCT: 41.1 % (ref 39.0–52.0)
Hemoglobin: 14.2 g/dL (ref 13.0–17.0)
Immature Granulocytes: 0 %
Lymphocytes Relative: 26 %
Lymphs Abs: 1.2 10*3/uL (ref 0.7–4.0)
MCH: 30.5 pg (ref 26.0–34.0)
MCHC: 34.5 g/dL (ref 30.0–36.0)
MCV: 88.2 fL (ref 80.0–100.0)
Monocytes Absolute: 0.6 10*3/uL (ref 0.1–1.0)
Monocytes Relative: 13 %
Neutro Abs: 2.6 10*3/uL (ref 1.7–7.7)
Neutrophils Relative %: 57 %
Platelets: 143 10*3/uL — ABNORMAL LOW (ref 150–400)
RBC: 4.66 MIL/uL (ref 4.22–5.81)
RDW: 13.6 % (ref 11.5–15.5)
WBC: 4.6 10*3/uL (ref 4.0–10.5)
nRBC: 0 % (ref 0.0–0.2)

## 2023-05-22 LAB — COMPREHENSIVE METABOLIC PANEL
ALT: 24 U/L (ref 0–44)
AST: 28 U/L (ref 15–41)
Albumin: 3.6 g/dL (ref 3.5–5.0)
Alkaline Phosphatase: 66 U/L (ref 38–126)
Anion gap: 8 (ref 5–15)
BUN: 13 mg/dL (ref 8–23)
CO2: 24 mmol/L (ref 22–32)
Calcium: 8.2 mg/dL — ABNORMAL LOW (ref 8.9–10.3)
Chloride: 103 mmol/L (ref 98–111)
Creatinine, Ser: 0.89 mg/dL (ref 0.61–1.24)
GFR, Estimated: >60 mL/min (ref >60)
Glucose, Bld: 138 mg/dL — ABNORMAL HIGH (ref 70–99)
Potassium: 4.2 mmol/L (ref 3.5–5.1)
Sodium: 135 mmol/L (ref 135–145)
Total Bilirubin: 0.7 mg/dL (ref 0.3–1.2)
Total Protein: 7.1 g/dL (ref 6.5–8.1)

## 2023-05-22 LAB — APTT: aPTT: 32 seconds (ref 24–36)

## 2023-05-22 LAB — PROTIME-INR
INR: 1 (ref 0.8–1.2)
Prothrombin Time: 12.9 seconds (ref 11.4–15.2)

## 2023-05-22 MED ORDER — CETIRIZINE HCL 10 MG PO TABS: 10.0000 mg | ORAL_TABLET | Freq: Every day | ORAL | 0 refills | Status: AC

## 2023-05-22 MED ORDER — CEPHALEXIN 500 MG PO CAPS: 500.0000 mg | ORAL_CAPSULE | Freq: Four times a day (QID) | ORAL | 0 refills | Status: AC

## 2023-05-22 MED ORDER — CEPHALEXIN 500 MG PO CAPS
500.0000 mg | ORAL_CAPSULE | Freq: Once | ORAL | Status: AC
  Administered 2023-05-22: 500 mg via ORAL
  Filled 2023-05-22: qty 1

## 2023-05-22 NOTE — ED Triage Notes (Signed)
Pt reports he has had a rash on his arms and chest for months.  Pt sister took him to urgent care and he was given triamcinolone cream and told it was psoriasis but his sister would like another opinion.

## 2023-05-22 NOTE — ED Provider Notes (Signed)
[place] EMERGENCY DEPARTMENT AT [facility] Provider Note   CSN: [id number] Arrival date & time: 05/22/23  1229     History  Chief Complaint  Patient presents with   Rash    Wesley [name] is a 77 y.o. male.  Past medical history of rectal cancer status post chemo and radiation, as well as resection with no recurrence.   Presents the ER today for evaluation of rash to his bilateral arms.  This started 3 weeks ago.  He is present with his sister at bedside who provides some history.  Rash described as itchy, has been getting progressively worse.  He was seen in urgent care and given triamcinolone and told it may be psoriasis.  Sister is concerned because she states his left arm is somewhat swollen and more red and she has noticed some patches on his anterior chest as well.  He has not had any fevers or chills, no trouble swallowing or breathing.  No new medications, no new lotions soaps or detergents.   Rash      Home Medications Prior to Admission medications   Medication Sig Start Date End Date Taking? Authorizing Provider  cephALEXin (KEFLEX) 500 MG capsule Take 1 capsule (500 mg total) by mouth 4 (four) times daily for 7 days. 05/22/23 05/29/23 Yes Braleigh Massoud A, PA-C  cetirizine (ZYRTEC ALLERGY) 10 MG tablet Take 1 tablet (10 mg total) by mouth daily. 05/22/23  Yes Sherly Brodbeck A, PA-C  acetaminophen (TYLENOL) 325 MG tablet Take 2 tablets (650 mg total) by mouth every 6 (six) hours as needed for mild pain (or Fever >/= 101). 09/05/21   Shon Hale, MD  ALPRAZolam Prudy Feeler) 0.5 MG tablet Take 1 tablet (0.5 mg total) by mouth at bedtime as needed for anxiety or sleep. 09/05/21   Shon Hale, MD  docusate sodium (COLACE) 100 MG capsule Take 1 capsule (100 mg total) by mouth 2 (two) times daily. 09/11/21   Lucretia Roers, MD  mesalamine (ROWASA) 4 g enema Place 60 mLs (4 g total) rectally at bedtime. 11/13/21 01/12/22  Lucretia Roers, MD   ondansetron (ZOFRAN) 4 MG tablet Take 1 tablet (4 mg total) by mouth every 8 (eight) hours as needed for nausea or vomiting. 09/11/21   Lucretia Roers, MD  oxyCODONE (OXY IR/ROXICODONE) 5 MG immediate release tablet Take 1 tablet (5 mg total) by mouth every 4 (four) hours as needed for moderate pain, breakthrough pain or severe pain. 09/18/21   Lucretia Roers, MD  polyethylene glycol (MIRALAX / GLYCOLAX) 17 g packet Take 17 g by mouth 2 (two) times daily. 09/05/21   Shon Hale, MD  sertraline (ZOLOFT) 50 MG tablet Take 1 tablet (50 mg total) by mouth daily. 09/06/21   Shon Hale, MD  zolpidem (AMBIEN) 10 MG tablet Take 1 tablet (10 mg total) by mouth at bedtime as needed for sleep. 10/18/21 11/17/21  Malachy Mood, MD      Allergies    Patient has no known allergies.    Review of Systems   Review of Systems  Skin:  Positive for rash.    Physical Exam Updated Vital Signs BP 120/70 (BP Location: Right Arm)   Pulse 79   Temp 97.8 F (36.6 C) (Oral)   Resp 16   Ht 5\' 6"  (1.676 m)   Wt 60.3 kg   SpO2 94%   BMI 21.47 kg/m  Physical Exam Vitals and nursing note reviewed.  Constitutional:  General: He is not in acute distress.    Appearance: He is well-developed.  HENT:     Head: Normocephalic and atraumatic.     Mouth/Throat:     Mouth: Mucous membranes are moist.     Pharynx: Oropharynx is clear.  Eyes:     Conjunctiva/sclera: Conjunctivae normal.     Pupils: Pupils are equal, round, and reactive to light.  Cardiovascular:     Rate and Rhythm: Normal rate and regular rhythm.     Heart sounds: No murmur heard. Pulmonary:     Effort: Pulmonary effort is normal. No respiratory distress.     Breath sounds: Normal breath sounds.  Abdominal:     Palpations: Abdomen is soft.     Tenderness: There is no abdominal tenderness.  Musculoskeletal:        General: No swelling.     Cervical back: Neck supple.  Lymphadenopathy:     Cervical: No cervical adenopathy.   Skin:    General: Skin is warm and dry.     Capillary Refill: Capillary refill takes less than 2 seconds.     Comments: Erythema to bilateral forearms and upper arms primarily on the dorsal aspect, scaly rash.  Erythema is blanching.  No petechiae or purpura.   No pustules or vesicles noted.  No induration or fluctuance.  Neurological:     General: No focal deficit present.     Mental Status: He is alert and oriented to person, place, and time.  Psychiatric:        Mood and Affect: Mood normal.        ED Results / Procedures / Treatments   Labs (all labs ordered are listed, but only abnormal results are displayed) Labs Reviewed  CBC WITH DIFFERENTIAL/PLATELET - Abnormal; Notable for the following components:      Result Value   Platelets 143 (*)    All other components within normal limits  COMPREHENSIVE METABOLIC PANEL - Abnormal; Notable for the following components:   Glucose, Bld 138 (*)    Calcium 8.2 (*)    All other components within normal limits  PROTIME-INR  APTT    EKG None  Radiology No results found.  Procedures Procedures    Medications Ordered in ED Medications  cephALEXin (KEFLEX) capsule 500 mg (500 mg Oral Given 05/22/23 1417)    ED Course/ Medical Decision Making/ A&P Clinical Course as of 05/22/23 1436  Thu May 22, 2023  1339 Is brought in by his sister for evaluation of rashes been going on for 3 weeks, he was seen in urgent care and given triamcinolone cream and told it was likely psoriasis.  History states that his left arm is now a bit more red and swollen and the rash seems to be spreading, she wanted a second opinion.  No fevers or chills, no new medications, no new lotions or soaps.  Has history of cancer but is not on any active treatment.  His history does note that she has noticed some increased bruising.  Patient has no bleeding, will get some basic labs to check his hemoglobin, platelets and coagulation factors given her concern of  easy bruising will likely treat with antibiotics for likely superimposed cellulitis on the left arm, and have follow-up with dermatology for the rash [CB]    Clinical Course User Index [CB] Carmel Sacramento A, PA-C  Medical Decision Making Ddx: cellulitis, psoriasis, drug rash, contact dermatitis, other ED course: Presents with rash to his bilateral arms for the past 3 weeks, his sister is concerned because she feels like it started to get worse and seems to be spreading.  It is itchy.  Has no new medications or contacts.  Labs ordered because she was running some bruising but patient has very slight send thrombocytopenia otherwise normal labs.  There is some mild warmth to the left forearm so we will treat with antibiotics for nonpurulent cellulitis imposed on this pre-existing rash.  Discussed need for outpatient follow-up with dermatology. Patient has no red flags of skin sloughing, fever, mucosal involvement.   Amount and/or Complexity of Data Reviewed External Data Reviewed: notes. Labs: ordered. Decision-making details documented in ED Course.  Risk Prescription drug management.           Final Clinical Impression(s) / ED Diagnoses Final diagnoses:  Rash  Cellulitis of upper extremity, unspecified laterality    Rx / DC Orders ED Discharge Orders          Ordered    cephALEXin (KEFLEX) 500 MG capsule  4 times daily        05/22/23 1436    cetirizine (ZYRTEC ALLERGY) 10 MG tablet  Daily        05/22/23 [address] PA-C 05/22/23 1436    Cathren Laine, MD 05/28/23 1353

## 2023-05-22 NOTE — ED Notes (Signed)
Introduced self to pt Pt HOH Pt stated he has had a rash on arms x3 months Sister at bedside stated that she just found out a week ago and noted that his LEFT arm is swelling.   Noted rash on both arms and minimally on chest.  Pt denies pain or burning, stated "it itches"  Blood collected by lab Pt attached to partial monitor  Waiting for further orders

## 2023-05-22 NOTE — Discharge Instructions (Addendum)
Continue using the topical steroids.  You are given a prescription for antibiotics for infection as well.  He did start taking Zyrtec to help with the itching as well, avoid hot showers.  It is very important that you follow-up with a dermatologist, who can better evaluate your skin condition better.

## 2023-06-18 ENCOUNTER — Encounter: Payer: Self-pay | Admitting: Dermatology

## 2023-06-18 ENCOUNTER — Ambulatory Visit (INDEPENDENT_AMBULATORY_CARE_PROVIDER_SITE_OTHER): Payer: Medicare HMO | Admitting: Dermatology

## 2023-06-18 DIAGNOSIS — L308 Other specified dermatitis: Secondary | ICD-10-CM | POA: Diagnosis not present

## 2023-06-18 DIAGNOSIS — R21 Rash and other nonspecific skin eruption: Secondary | ICD-10-CM

## 2023-06-18 DIAGNOSIS — D485 Neoplasm of uncertain behavior of skin: Secondary | ICD-10-CM

## 2023-06-18 MED ORDER — CLOBETASOL PROPIONATE 0.05 % EX CREA
1.0000 | TOPICAL_CREAM | Freq: Two times a day (BID) | CUTANEOUS | 0 refills | Status: AC
Start: 2023-06-18 — End: ?

## 2023-06-18 MED ORDER — PREDNISONE 10 MG PO TABS
ORAL_TABLET | ORAL | 0 refills | Status: AC
Start: 2023-06-18 — End: 2023-07-04

## 2023-06-18 NOTE — Progress Notes (Signed)
New Patient Visit   Subjective  Wesley [name] is a 77 y.o. male accompanied by his sister Marisue Humble, who presents for the following: Rash  Patient states he  has rash located at the face, chest, arms,scalp and back that he  would like to have examined. Patient reports the areas have been there for  4  month(s). He reports the areas are bothersome. He states the areas itchy on and off.He rates the itch 5 out 29f 10 He states that the areas has spread. Patient reports has previously been treated for these areas by the ED. At that time he was prescribed Cephalexin. He was also prescribed TMC to apply 2 times daily. His last dose was 2 days ago. Patient denies Hx of bx. Patient denies family history of skin cancer(s).    The following portions of the chart were reviewed this encounter and updated as appropriate: medications, allergies, medical history  Review of Systems:  No other skin or systemic complaints except as noted in HPI or Assessment and Plan.  Objective  Well appearing patient in no apparent distress; mood and affect are within normal limits.  A focused examination was performed of the following areas: B/L Arms, Chest, Back, Face, Scalp  Relevant exam findings are noted in the Assessment and Plan. Right Lower Back Large amular erythematous patches involving arms, chest, back, face  Right Upper Arm - Posterior Large amular erythematous patches involving arms, chest, back, face                            Assessment & Plan   Rash Exam:  Large amular erythematous patches involving arms, chest, back, face   Physical Test: 5 out of 5 hand grip 4/5 push strength 3/5 shoulder shrug 5/ 5 B/L muscle test within normal findings No mucosal finding Oral mucosal within normal findings Conjuntivie pink and normal however rash involvement of face and neck   Treatment Plan:  - 1. Rash with Annular Plaques and Erythema - Assessment: Rash spreading to chest, back,  and face. Differential diagnosis includes Lupus, dermatomyositis, drug reaction. - Plan:    a. Conduct punch biopsy     b. Order laboratory tests including ANA, specific markers for dermatomyositis, and complete blood count (including platelet count).    c. Prescribe a clobetasol  cream    d. Prescribe oral prednisone taper.    e. Schedule a follow-up appointment in 2 weeks for stitch removal and to discuss biopsy and lab results.  Recommend gentle skin care.   Rash and other nonspecific skin eruption  Related Procedures CBC with Differential/Platelets ANA,IFA RA Diag Pnl w/rflx Tit/Patn Angi-Jo 1 antibody, IgG Anti-Sm Ab (RDL) Sjogrens syndrome-A extractable nuclear antibody Sjogrens syndrome-B extractable nuclear antibody ANA+ENA+DNA/DS+Antich+Centr CK Sedimentation Rate  Related Medications clobetasol cream (TEMOVATE) 0.05 % Apply 1 Application topically 2 (two) times daily. APPLY FOR 2 times a day 2 WEEKS THEN STOP  predniSONE (DELTASONE) 10 MG tablet Take 4 tablets (40 mg total) by mouth daily for 4 days, THEN 3 tablets (30 mg total) daily for 4 days, THEN 2 tablets (20 mg total) daily for 4 days, THEN 1 tablet (10 mg total) daily for 4 days.  Neoplasm of uncertain behavior of skin (2) Right Lower Back  Skin / nail biopsy Type of biopsy: punch   Informed consent: discussed and consent obtained   Timeout: patient name, date of birth, surgical site, and procedure verified   Procedure prep:  Patient was prepped and draped in usual sterile fashion Prep type:  Isopropyl alcohol Anesthesia: the lesion was anesthetized in a standard fashion   Anesthetic:  1% lidocaine w/ epinephrine 1-100,000 buffered w/ 8.4% NaHCO3 Punch size:  6 mm Suture size:  3-0 Suture type: nylon   Hemostasis achieved with: suture and aluminum chloride   Outcome: patient tolerated procedure well   Post-procedure details: sterile dressing applied and wound care instructions given   Dressing type:  petrolatum gauze    Specimen 1 - Surgical pathology Differential Diagnosis: Lupus vs Dermatomyositis vs Drug Reaction vs Psoriasis  Check Margins: No  Right Upper Arm - Posterior  Skin / nail biopsy Type of biopsy: punch   Informed consent: discussed and consent obtained   Timeout: patient name, date of birth, surgical site, and procedure verified   Procedure prep:  Patient was prepped and draped in usual sterile fashion Prep type:  Isopropyl alcohol Anesthesia: the lesion was anesthetized in a standard fashion   Anesthetic:  1% lidocaine w/ epinephrine 1-100,000 buffered w/ 8.4% NaHCO3 Punch size:  6 mm Suture size:  4-0 Suture type: nylon   Hemostasis achieved with: suture and aluminum chloride   Outcome: patient tolerated procedure well   Post-procedure details: sterile dressing applied and wound care instructions given   Dressing type: petrolatum gauze    Specimen 2 - Surgical pathology Differential Diagnosis: Lupus vs Dermatomyositis vs Drug Reaction vs Psoriasis  Check Margins: No    Return in about 2 weeks (around 07/02/2023) for Rash F/U.   Documentation: I have reviewed the above documentation for accuracy and completeness, and I agree with the above.  Stasia Cavalier, am acting as scribe for [name], DO.  [name], DO

## 2023-06-18 NOTE — Patient Instructions (Addendum)
Thank you for visiting our clinic today. We appreciate your commitment to addressing your health concerns and are dedicated to assisting you in improving your condition.  Here is a summary of the key instructions and next steps from today's consultation:  - Skin Biopsy: We will perform a skin biopsy on your back to examine the untreated rash area. This will help [place] understand the nature of the rash without the influence of previous treatments. - Blood Work: We will conduct blood tests to check for autoimmune conditions such as lupus or dermatomyositis. This includes an ANA test and specific markers for dermatomyositis. - Medications:   - Topical Clobetasol Cream:  to apply to the affected areas on twice a day   - Oral Steroid (Prednisone): To help reduce the rash and alleviate itching. A 16 Day taper starting at 40mg  daily. - Follow-Up Appointment: Please return in two weeks to remove the stitches from the biopsy site and discuss the lab and biopsy results.   Please ensure to follow the medication guidelines and attend your follow-up appointment so we can continue to monitor your progress and adjust your treatment plan as necessary.  Thank you for trusting [place] with your care.       Patient Handout: Wound Care for Skin Biopsy Site  Patient Handout: Wound Care for Skin Biopsy Site  Taking Care of Your Skin Biopsy Site  Proper care of the biopsy site is essential for promoting healing and minimizing scarring. This handout provides instructions on how to care for your biopsy site to ensure optimal recovery.  1. Cleaning the Wound:  Clean the biopsy site daily with gentle soap and water. Gently pat the area dry with a clean, soft towel. Avoid harsh scrubbing or rubbing the area, as this can irritate the skin and delay healing.  2. Applying Aquaphor and Bandage:  After cleaning the wound, apply a thin layer of Aquaphor ointment to the biopsy site. Cover the area with a sterile bandage  to protect it from dirt, bacteria, and friction. Change the bandage daily or as needed if it becomes soiled or wet.  3. Continued Care for One Week:  Repeat the cleaning, Aquaphor application, and bandaging process daily for one week following the biopsy procedure. Keeping the wound clean and moist during this initial healing period will help prevent infection and promote optimal healing.  4. Massaging Aquaphor into the Area:  ---After one week, discontinue the use of bandages but continue to apply Aquaphor to the biopsy site. ----Gently massage the Aquaphor into the area using circular motions. ---Massaging the skin helps to promote circulation and prevent the formation of scar tissue.   Additional Tips:  Avoid exposing the biopsy site to direct sunlight during the healing process, as this can cause hyperpigmentation or worsen scarring. If you experience any signs of infection, such as increased redness, swelling, warmth, or drainage from the wound, contact your healthcare provider immediately. Follow any additional instructions provided by your healthcare provider for caring for the biopsy site and managing any discomfort. Conclusion:  Taking proper care of your skin biopsy site is crucial for ensuring optimal healing and minimizing scarring. By following these instructions for cleaning, applying Aquaphor, and massaging the area, you can promote a smooth and successful recovery. If you have any questions or concerns about caring for your biopsy site, don't hesitate to contact your healthcare provider for guidance.     Due to recent changes in healthcare laws, you may see results of your pathology and/or  laboratory studies on MyChart before the doctors have had a chance to review them. We understand that in some cases there may be results that are confusing or concerning to you. Please understand that not all results are received at the same time and often the doctors may need to interpret  multiple results in order to provide you with the best plan of care or course of treatment. Therefore, we ask that you please give [place] 2 business days to thoroughly review all your results before contacting the office for clarification. Should we see a critical lab result, you will be contacted sooner.   If You Need Anything After Your Visit  If you have any questions or concerns for your doctor, please call our main line at [phone] If no one answers, please leave a voicemail as directed and we will return your call as soon as possible. Messages left after 4 pm will be answered the following business day.   You may also send [place] a message via MyChart. We typically respond to MyChart messages within 1-2 business days.  For prescription refills, please ask your pharmacy to contact our office. Our fax number is [phone].  If you have an urgent issue when the clinic is closed that cannot wait until the next business day, you can page your doctor at the number below.    Please note that while we do our best to be available for urgent issues outside of office hours, we are not available 24/7.   If you have an urgent issue and are unable to reach [place], you may choose to seek medical care at your doctor's office, retail clinic, urgent care center, or emergency room.  If you have a medical emergency, please immediately call 911 or go to the emergency department. In the event of inclement weather, please call our main line at [phone] for an update on the status of any delays or closures.  Dermatology Medication Tips: Please keep the boxes that topical medications come in in order to help keep track of the instructions about where and how to use these. Pharmacies typically print the medication instructions only on the boxes and not directly on the medication tubes.   If your medication is too expensive, please contact our office at [phone] or send [place] a message through MyChart.   We are  unable to tell what your co-pay for medications will be in advance as this is different depending on your insurance coverage. However, we may be able to find a substitute medication at lower cost or fill out paperwork to get insurance to cover a needed medication.   If a prior authorization is required to get your medication covered by your insurance company, please allow [place] 1-2 business days to complete this process.  Drug prices often vary depending on where the prescription is filled and some pharmacies may offer cheaper prices.  The website www.goodrx.com contains coupons for medications through different pharmacies. The prices here do not account for what the cost may be with help from insurance (it may be cheaper with your insurance), but the website can give you the price if you did not use any insurance.  - You can print the associated coupon and take it with your prescription to the pharmacy.  - You may also stop by our office during regular business hours and pick up a GoodRx coupon card.  - If you need your prescription sent electronically to a different pharmacy, notify our office through [facility]  or by phone at [phone]

## 2023-06-24 NOTE — Progress Notes (Signed)
Pathology highly suggestive of dermatomyositis. Pt current on the appropriate treatment regimen. Will discuss in detail at [facility] / Follow up visit.

## 2023-07-01 ENCOUNTER — Telehealth: Payer: Self-pay

## 2023-07-01 NOTE — Telephone Encounter (Signed)
LVM for patient to call back [phone], or to call PCP office to schedule follow up apt. AS, CMA  

## 2023-07-02 ENCOUNTER — Ambulatory Visit: Payer: Medicare HMO | Admitting: Dermatology

## 2023-07-10 ENCOUNTER — Telehealth: Payer: Self-pay

## 2023-07-10 ENCOUNTER — Ambulatory Visit: Payer: Medicare HMO | Admitting: Dermatology

## 2023-07-10 ENCOUNTER — Encounter: Payer: Self-pay | Admitting: Dermatology

## 2023-07-10 VITALS — BP 113/69 | HR 78

## 2023-07-10 DIAGNOSIS — L308 Other specified dermatitis: Secondary | ICD-10-CM

## 2023-07-10 DIAGNOSIS — M3313 Other dermatomyositis without myopathy: Secondary | ICD-10-CM

## 2023-07-10 DIAGNOSIS — R21 Rash and other nonspecific skin eruption: Secondary | ICD-10-CM

## 2023-07-10 MED ORDER — PREDNISONE 10 MG PO TABS: ORAL_TABLET | ORAL | 0 refills | Status: AC

## 2023-07-10 MED ORDER — TRIAMCINOLONE ACETONIDE 0.1 % EX CREA: 1.0000 | TOPICAL_CREAM | Freq: Two times a day (BID) | CUTANEOUS | 2 refills | Status: AC | PRN

## 2023-07-10 NOTE — Telephone Encounter (Signed)
Hi Wesley [name].  From our most recent conversation it looks like his daughter is able to bring him in today for SRM and discuss treatment plan.  Thanks!

## 2023-07-10 NOTE — Progress Notes (Signed)
   Follow-Up Visit   Subjective  Wesley [name] is a 77 y.o. male who presents for the following: recently bx'd rash, suspected dermatomyositis or other autoimmune disorder.   Patient present today for follow up visit for rash. Patient was last evaluated on 07/01/22. Patient reports are worsening. He has been applying Clobetasol as directed. He states then when he took Prednisone it did clear everything up but it returned when he finished. Patient reports no medication changes.  Pt does admit that he has dry eyes but no dry mouth.   The following portions of the chart were reviewed this encounter and updated as appropriate: medications, allergies, medical history  Review of Systems:  No other skin or systemic complaints except as noted in HPI or Assessment and Plan.  Objective  Well appearing patient in no apparent distress; mood and affect are within normal limits.  A focused examination was performed of the following areas: Back, arms, chest  Relevant exam findings are noted in the Assessment and Plan.   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   - Triamcinolone Cream: Apply twice daily for two weeks, then take a two-week break. Use CeraVe anti-itch lotion before applying the steroid cream.  2.  Muscle Strength and Function - Assessment: Normal muscle tone and strength in upper and lower extremities and hands observed during the physical examination. - Plan: Continue monitoring for any changes in muscle function, especially in relation to a potential diagnosis of dermatomyositis.  According to pt's daughter, his mobility is declining and he's not able to walk for very long.  3. Medication Review and Update -  Assessment: Not explicitly mentioned. - Plan: Patient to provide a list of current medications via MyChart message, including the name of the medication taken at night (possibly Ambien). Update medication list in the patient's record and remove any discontinued medications (mesalamine, oxycodone, sertraline).  Follow-up: - Plan: Schedule a follow-up appointment in three weeks to assess the patient's response to treatment, discuss further diagnostic tests, and plan the next steps in management.Rash and other nonspecific skin eruption  Related Procedures ANA,IFA RA Diag Pnl w/rflx Tit/Patn  Related Medications clobetasol cream (TEMOVATE) 0.05 % Apply 1 Application topically 2 (two) times daily. APPLY FOR 2 times a day 2 WEEKS THEN STOP     Encounter for Removal of Sutures - Incision site at the back and are  clean, dry and intact - Wound cleansed, sutures removed, wound cleansed and steri strips applied.  - Discussed pathology results showing dermatomyositis  - Scars remodel for a full year.  Patient can apply over-the-counter silicone scar cream each night to help with scar remodeling if desired. - Patient advised to call with any concerns or if they notice any new or changing lesions.    Return in about 3 weeks (around 07/31/2023) for dermatomyositis.  Owens Shark, CMA, am acting as scribe for [employer], DO.   Documentation: I have reviewed the above documentation for accuracy and completeness, and I agree with the above.  [name], DO

## 2023-07-10 NOTE — Telephone Encounter (Signed)
Pt's daughter called saying she had called last week and had not gotten a reply and was upset. She said pt's rash had spread and he needed to have his sutures removed. I gave her the biopsy results and asked if they could come in today but she said they could not because of a work conflict. They will come in Monday to have the sutures removed. She asked for oral steroid to help calm things back down and I said I'd discuss with the doctor and let her know

## 2023-07-10 NOTE — Patient Instructions (Addendum)
Hello Wesley [name],  Thank you for visiting my office today. I appreciate your commitment to improving your health and am glad we could discuss your condition in detail. Here is a summary of the key instructions and next steps from our appointment:  - Medications Prescribed:   - Prednisone: Start with 40 mg for 5 days, then taper down as instructed over the next 15 days.   - Triamcinolone Cream: Apply twice daily for two weeks, then take a two-week break. Use CeraVe anti-itch lotion before applying the steroid cream.  - Laboratory Tests and Follow-Up:   - Lab Work: Please visit [facility] tomorrow to have your ANA levels checked before starting Prednisone. This test is crucial to further assess the possibility of lupus or early dermatomyositis.   - Follow-Up Appointment: I will see you back in three weeks to evaluate your progress and discuss transitioning to a new medication.  - General Advice:   - Ensure to update [place] via MyChart with the name of the medication you take at night for sleep, suspected to be Ambien (Zolpidem).  Thank you once again for your cooperation and openness during our consultation. Looking forward to seeing you in three weeks and hoping for significant improvement in your symptoms.  Warm regards,  Dr. [name], MD Dermatology        Due to recent changes in healthcare laws, you may see results of your pathology and/or laboratory studies on MyChart before the doctors have had a chance to review them. We understand that in some cases there may be results that are confusing or concerning to you. Please understand that not all results are received at the same time and often the doctors may need to interpret multiple results in order to provide you with the best plan of care or course of treatment. Therefore, we ask that you please give [place] 2 business days to thoroughly review all your results before contacting the office for clarification. Should we see a critical lab  result, you will be contacted sooner.   If You Need Anything After Your Visit  If you have any questions or concerns for your doctor, please call our main line at [phone] If no one answers, please leave a voicemail as directed and we will return your call as soon as possible. Messages left after 4 pm will be answered the following business day.   You may also send [place] a message via MyChart. We typically respond to MyChart messages within 1-2 business days.  For prescription refills, please ask your pharmacy to contact our office. Our fax number is [phone].  If you have an urgent issue when the clinic is closed that cannot wait until the next business day, you can page your doctor at the number below.    Please note that while we do our best to be available for urgent issues outside of office hours, we are not available 24/7.   If you have an urgent issue and are unable to reach [place], you may choose to seek medical care at your doctor's office, retail clinic, urgent care center, or emergency room.  If you have a medical emergency, please immediately call 911 or go to the emergency department. In the event of inclement weather, please call our main line at [phone] for an update on the status of any delays or closures.  Dermatology Medication Tips: Please keep the boxes that topical medications come in in order to help keep track of the instructions about where and how  to use these. Pharmacies typically print the medication instructions only on the boxes and not directly on the medication tubes.   If your medication is too expensive, please contact our office at [phone] or send [place] a message through MyChart.   We are unable to tell what your co-pay for medications will be in advance as this is different depending on your insurance coverage. However, we may be able to find a substitute medication at lower cost or fill out paperwork to get insurance to cover a needed medication.    If a prior authorization is required to get your medication covered by your insurance company, please allow [place] 1-2 business days to complete this process.  Drug prices often vary depending on where the prescription is filled and some pharmacies may offer cheaper prices.  The website www.goodrx.com contains coupons for medications through different pharmacies. The prices here do not account for what the cost may be with help from insurance (it may be cheaper with your insurance), but the website can give you the price if you did not use any insurance.  - You can print the associated coupon and take it with your prescription to the pharmacy.  - You may also stop by our office during regular business hours and pick up a GoodRx coupon card.  - If you need your prescription sent electronically to a different pharmacy, notify our office through [facility] or by phone at [phone]

## 2023-07-14 ENCOUNTER — Ambulatory Visit: Payer: Medicare HMO | Admitting: Dermatology

## 2023-07-15 ENCOUNTER — Ambulatory Visit: Payer: Medicare HMO | Admitting: Dermatology

## 2023-07-21 ENCOUNTER — Telehealth: Payer: Self-pay

## 2023-07-21 DIAGNOSIS — R21 Rash and other nonspecific skin eruption: Secondary | ICD-10-CM

## 2023-07-21 MED ORDER — HYDROXYCHLOROQUINE SULFATE 200 MG PO TABS
200.0000 mg | ORAL_TABLET | Freq: Every day | ORAL | 3 refills | Status: DC
Start: 2023-07-21 — End: 2023-11-20

## 2023-07-21 NOTE — Progress Notes (Signed)
Labwork was WNL.  The plan will be to start him on Plaquenil 200mg  daily once he finishes the prednisone. He cannot take both at the same time bc there will be a possible interaction.  We can send the script now but he must stop the prednisone once he starts the plaquenil.  Thnaks!

## 2023-07-21 NOTE — Telephone Encounter (Signed)
Pt's daughter called office requesting lab results and to discuss what the next steps of treatment will be after he completes his Prednisone dose. Dr. Onalee Hua reviewed pt's lab results and advised all labs are WNL. She recommends pt staring Plaqunil after he completes the prednisone dose. Daughter advised of lab results and new medication. Advised we will plan to follow up on September 3rd. Daughter voiced her understanding.

## 2023-08-05 ENCOUNTER — Encounter: Payer: Self-pay | Admitting: Dermatology

## 2023-08-05 ENCOUNTER — Ambulatory Visit: Payer: Medicare HMO | Admitting: Dermatology

## 2023-08-05 DIAGNOSIS — R21 Rash and other nonspecific skin eruption: Secondary | ICD-10-CM | POA: Diagnosis not present

## 2023-08-05 NOTE — Progress Notes (Signed)
   Follow-Up Visit   Subjective  Wesley [name] is a 77 y.o. male who presents for the following: rash follow up. Patient accompanied by daughter who contributes to history.   Patient started plaquenil 8/30 after completing prednisone. Patient's daughter feels like the skin at arm may be a little more red since stopping plaquenil but that the rash seems improved and stable.   The patient has spots, moles and lesions to be evaluated, some may be new or changing and the patient may have concern these could be cancer.   The following portions of the chart were reviewed this encounter and updated as appropriate: medications, allergies, medical history  Review of Systems:  No other skin or systemic complaints except as noted in HPI or Assessment and Plan.  Objective  Well appearing patient in no apparent distress; mood and affect are within normal limits.   A focused examination was performed of the following areas: Back, abdomen, arms  Relevant exam findings are noted in the Assessment and Plan.    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 Noted a few new red spots in the last couple of days.  - Plan: Continue hydroxychloroquine 200 mg daily, with 30 tablets and three refills. Apply clobetasol cream daily to the spots on the lower belly and back. Follow-up in three months or sooner if needed.  2. General Health and Nutrition - Assessment: Patient reports feeling unwell despite skin improvement. Limited income from social security impacts diet. - Plan: Recommend Ensure Plus for diet supplementation and weight gain. Take a  daily multivitamin and B-Complex for energy. Encourage consumption of fruits and vegetables, suggesting frozen vegetables as an affordable option. Advise avoiding processed foods.  3. Follow-up and Monitoring - Plan: Schedule a follow-up appointment in three months to assess progress and adjust treatment plan as necessary. Encourage patient to contact the clinic if any concerns or issues arise before the scheduled follow-up appointment.     Return in about 3 months (around 11/04/2023).  Anise Salvo, RMA, am acting as scribe for [employer], DO .   Documentation: I have reviewed the above documentation for accuracy and completeness, and I agree with the above.  [name], DO

## 2023-08-05 NOTE — Patient Instructions (Addendum)
Hello Jabali,  Thank you for visiting [place] today. Your dedication to improving your health is commendable, and it's encouraging to see the progress in your skin condition. Below are the key instructions from today's consultation for your reference:  - Medications:   - Hydroxychloroquine: Continue with Hydroxychloroquine 200 mg daily. You have 30 tablets with three refills available. This medication is part of your long-term treatment plan.   - Clobetasol Cream: Apply this cream daily to the affected spots on your lower belly and back.  - Dietary Recommendations:   - Ensure Plus: Incorporate Ensure Plus into your daily regimen to aid in weight gain and improve energy levels.   - Vitamins: Take a multivitamin daily along with a B-Complex vitamin to enhance energy and nutrient absorption.   - Diet Tips: Favor frozen vegetables for their nutritional value and cost-effectiveness. Aim to minimize consumption of processed foods.  - Follow-Up:   - We would like to see you again in three months for a follow-up appointment. Should you encounter any issues or have concerns before then, please do not hesitate to contact our office.  - Additional Advice:   - Keep a close watch on your skin condition, particularly the new red spots. Use the prescribed cream as directed to manage any flare-ups effectively.  We are here to support you on your journey towards better health. If you have any questions or require further assistance, please feel free to reach out.  Warm regards,  Dr. [name], Dermatology      Important Information  Due to recent changes in healthcare laws, you may see results of your pathology and/or laboratory studies on MyChart before the doctors have had a chance to review them. We understand that in some cases there may be results that are confusing or concerning to you. Please understand that not all results are received at the same time and often the doctors may need to  interpret multiple results in order to provide you with the best plan of care or course of treatment. Therefore, we ask that you please give [place] 2 business days to thoroughly review all your results before contacting the office for clarification. Should we see a critical lab result, you will be contacted sooner.   If You Need Anything After Your Visit  If you have any questions or concerns for your doctor, please call our main line at [phone] If no one answers, please leave a voicemail as directed and we will return your call as soon as possible. Messages left after 4 pm will be answered the following business day.   You may also send [place] a message via MyChart. We typically respond to MyChart messages within 1-2 business days.  For prescription refills, please ask your pharmacy to contact our office. Our fax number is [phone].  If you have an urgent issue when the clinic is closed that cannot wait until the next business day, you can page your doctor at the number below.    Please note that while we do our best to be available for urgent issues outside of office hours, we are not available 24/7.   If you have an urgent issue and are unable to reach [place], you may choose to seek medical care at your doctor's office, retail clinic, urgent care center, or emergency room.  If you have a medical emergency, please immediately call 911 or go to the emergency department. In the event of inclement weather, please call our main line at [phone]  for an update on the status of any delays or closures.  Dermatology Medication Tips: Please keep the boxes that topical medications come in in order to help keep track of the instructions about where and how to use these. Pharmacies typically print the medication instructions only on the boxes and not directly on the medication tubes.   If your medication is too expensive, please contact our office at [phone] or send [place] a message through MyChart.    We are unable to tell what your co-pay for medications will be in advance as this is different depending on your insurance coverage. However, we may be able to find a substitute medication at lower cost or fill out paperwork to get insurance to cover a needed medication.   If a prior authorization is required to get your medication covered by your insurance company, please allow [place] 1-2 business days to complete this process.  Drug prices often vary depending on where the prescription is filled and some pharmacies may offer cheaper prices.  The website www.goodrx.com contains coupons for medications through different pharmacies. The prices here do not account for what the cost may be with help from insurance (it may be cheaper with your insurance), but the website can give you the price if you did not use any insurance.  - You can print the associated coupon and take it with your prescription to the pharmacy.  - You may also stop by our office during regular business hours and pick up a GoodRx coupon card.  - If you need your prescription sent electronically to a different pharmacy, notify our office through [facility] or by phone at [phone]

## 2023-08-21 ENCOUNTER — Telehealth: Payer: Self-pay

## 2023-08-21 NOTE — Telephone Encounter (Signed)
I spoke with pt's daughter Misty Stanley) and asked for clarification on how often pt is using topical and if he is taking Plaqunil daily. Pt's daughter stated she thinks he is applying the cream at least once a day and she thinks he is taking the Plaqunil daily. I advised that Dr. Onalee Hua recommends him coming in for [place] to reassess the areas prior to changing the therapy. Daughter states she will call back to schedule. I advised the appt will need to be on a MONDAY or THURSDAY ONLY due to short staffing. Daughter voiced her understanding.

## 2023-11-06 ENCOUNTER — Ambulatory Visit: Payer: Medicare HMO | Admitting: Dermatology

## 2023-11-06 ENCOUNTER — Encounter: Payer: Self-pay | Admitting: Dermatology

## 2023-11-06 DIAGNOSIS — R222 Localized swelling, mass and lump, trunk: Secondary | ICD-10-CM | POA: Diagnosis not present

## 2023-11-06 DIAGNOSIS — R110 Nausea: Secondary | ICD-10-CM | POA: Diagnosis not present

## 2023-11-06 DIAGNOSIS — T378X5A Adverse effect of other specified systemic anti-infectives and antiparasitics, initial encounter: Secondary | ICD-10-CM | POA: Diagnosis not present

## 2023-11-06 DIAGNOSIS — M3310 Other dermatopolymyositis, organ involvement unspecified: Secondary | ICD-10-CM

## 2023-11-06 DIAGNOSIS — M3313 Other dermatomyositis without myopathy: Secondary | ICD-10-CM

## 2023-11-06 MED ORDER — ONDANSETRON HCL 4 MG PO TABS
4.0000 mg | ORAL_TABLET | Freq: Three times a day (TID) | ORAL | 0 refills | Status: AC | PRN
Start: 2023-11-06 — End: ?

## 2023-11-06 NOTE — Patient Instructions (Addendum)
Hello Wesley [name],  Thank you for visiting [place] today. Your dedication to managing your health and condition is greatly appreciated. Below is a summary of the key instructions from today's consultation:  - Hydroxychloroquine: Continue taking as prescribed.  - Zofran (Ondansetron): Start for nausea. Should it prove ineffective, please contact us before discontinuing.  - Tums: Use for immediate nausea relief as needed.  - Prescribed Cream: Increase application to twice daily.  - CeraVe Anti-Itch: Purchase a larger size from [employer], [employer], or [facility]. Use in conjunction with the body wash and lotion, applying the medicine on red spots afterward.  - Aveeno Oatmeal Baths: Consider for dry skin, followed by CeraVe moisturizer and Triamcinolone on red spots.  - Chest X-Ray: Schedule and follow up with your general doctor regarding a potential biopsy or removal of the suspected lipoma.  - Screenings: Ensure you are up-to-date with EGDs, Chest X-Ray, PSA and colonoscopy to investigate potential underlying malignancy.  - Follow-Up: We will see you again in three months to asses the progress of your skin.   And follow up on the results of the recommended chest x-rays, colonoscopy and any other imaging performed.  Your prescriptions have been sent to your regular pharmacy. Should you have any questions or concerns before our next appointment, please do not hesitate to reach out.  Warm regards,  Dr. [name],  Dermatology         Important Information  Due to recent changes in healthcare laws, you may see results of your pathology and/or laboratory studies on MyChart before the doctors have had a chance to review them. We understand that in some cases there may be results that are confusing or concerning to you. Please understand that not all results are received at the same time and often the doctors may need to interpret multiple results in order to provide you with the best plan of care  or course of treatment. Therefore, we ask that you please give [place] 2 business days to thoroughly review all your results before contacting the office for clarification. Should we see a critical lab result, you will be contacted sooner.   If You Need Anything After Your Visit  If you have any questions or concerns for your doctor, please call our main line at [phone] If no one answers, please leave a voicemail as directed and we will return your call as soon as possible. Messages left after 4 pm will be answered the following business day.   You may also send [place] a message via MyChart. We typically respond to MyChart messages within 1-2 business days.  For prescription refills, please ask your pharmacy to contact our office. Our fax number is [phone].  If you have an urgent issue when the clinic is closed that cannot wait until the next business day, you can page your doctor at the number below.    Please note that while we do our best to be available for urgent issues outside of office hours, we are not available 24/7.   If you have an urgent issue and are unable to reach [place], you may choose to seek medical care at your doctor's office, retail clinic, urgent care center, or emergency room.  If you have a medical emergency, please immediately call 911 or go to the emergency department. In the event of inclement weather, please call our main line at [phone] for an update on the status of any delays or closures.  Dermatology Medication Tips: Please keep the boxes that  topical medications come in in order to help keep track of the instructions about where and how to use these. Pharmacies typically print the medication instructions only on the boxes and not directly on the medication tubes.   If your medication is too expensive, please contact our office at [phone] or send [place] a message through MyChart.   We are unable to tell what your co-pay for medications will be in advance as  this is different depending on your insurance coverage. However, we may be able to find a substitute medication at lower cost or fill out paperwork to get insurance to cover a needed medication.   If a prior authorization is required to get your medication covered by your insurance company, please allow [place] 1-2 business days to complete this process.  Drug prices often vary depending on where the prescription is filled and some pharmacies may offer cheaper prices.  The website www.goodrx.com contains coupons for medications through different pharmacies. The prices here do not account for what the cost may be with help from insurance (it may be cheaper with your insurance), but the website can give you the price if you did not use any insurance.  - You can print the associated coupon and take it with your prescription to the pharmacy.  - You may also stop by our office during regular business hours and pick up a GoodRx coupon card.  - If you need your prescription sent electronically to a different pharmacy, notify our office through [facility] or by phone at [phone]

## 2023-11-06 NOTE — Progress Notes (Signed)
   Follow-Up Visit   Subjective  Wesley [name] is a 77 y.o. male who presents for the following: dermatomyositis  The patient, Wesley [name], reports that the original areas of concern were clearing while on medication but returned once her stopped.  He states that he was previously on Plaquenil, which helped for some time, but he experienced nausea and stopped taking it for a month. The rash returned, and he has been back on hydroxychloroquine for approximately 2 months, which has helped clear the issue. The patient reports that the itch is not bad and feels that the medication is working again. However, he still experiences nausea and feels like he needs to vomit.  The patient confirms using the prescribed cream once a day and CeraVe Anti-Itch lotion sparingly. He also mentions a spot on his side that feels like a lipoma. The patient has a history of smoking since the age of 81. He has not seen his GI doctor recently and has not had an EGD or colonoscopy in a while.   The following portions of the chart were reviewed this encounter and updated as appropriate: medications, allergies, medical history  Review of Systems:  No other skin or systemic complaints except as noted in HPI or Assessment and Plan.  Objective  Well appearing patient in no apparent distress; mood and affect are within normal limits.   A focused examination was performed of the following areas: face & back   Relevant exam findings are noted in the Assessment and Plan.         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 Currently experiencing nausea as a side effect. Skin examination shows improvement, with clearing of the top area and better condition of hands and arms, though skin remains dry. - Plan: Continue hydroxychloroquine. Prescribe Zofran (ondansetron) for nausea.   Recommend Tums for immediate nausea relief. Increase Triamcinolone cream application to twice daily on red spots.  Advise use of CeraVe Anti-Itch lotion, body wash, and moisturizer.  Recommend Aveeno oatmeal baths for dry skin. Follow-up in 3 months.  2. Potential Underlying Malignancy - Assessment: Patient has not had recent gastrointestinal evaluations. A lipoma-like mass was noted on the patient's side. Patient reports smoking since age 64. - Plan: Recommend chest x-ray. Advise colonoscopy. Suggest EGD (esophagogastroduodenoscopy). Recommend prostate check and possible PSA blood test. Per Patient's daughter who is accompanying him at this visit, he has appointment with PCP next week and will discuss initiating routine cancer screenings with his current suspicions of underlying malignancy Consider referral to Wesley [name] for potential excisional biopsy of lipoma on flank.    No follow-ups on file.    Documentation: I have reviewed the above documentation for accuracy and completeness, and I agree with the above.   I, Wesley [name], CMA, am acting as scribe for [employer] [name].   Wesley [name]

## 2023-11-20 ENCOUNTER — Other Ambulatory Visit: Payer: Self-pay

## 2023-11-20 DIAGNOSIS — R21 Rash and other nonspecific skin eruption: Secondary | ICD-10-CM

## 2023-11-20 MED ORDER — HYDROXYCHLOROQUINE SULFATE 200 MG PO TABS
200.0000 mg | ORAL_TABLET | Freq: Every day | ORAL | 3 refills | Status: DC
Start: 2023-11-20 — End: 2024-03-04

## 2023-11-20 NOTE — Progress Notes (Signed)
Pt requested refill

## 2024-03-04 ENCOUNTER — Encounter: Payer: Self-pay | Admitting: Dermatology

## 2024-03-04 ENCOUNTER — Ambulatory Visit: Payer: Medicare HMO | Admitting: Dermatology

## 2024-03-04 VITALS — BP 120/69 | HR 89

## 2024-03-04 DIAGNOSIS — Z85038 Personal history of other malignant neoplasm of large intestine: Secondary | ICD-10-CM | POA: Diagnosis not present

## 2024-03-04 DIAGNOSIS — E785 Hyperlipidemia, unspecified: Secondary | ICD-10-CM

## 2024-03-04 DIAGNOSIS — M3310 Other dermatopolymyositis, organ involvement unspecified: Secondary | ICD-10-CM | POA: Diagnosis not present

## 2024-03-04 DIAGNOSIS — M3313 Other dermatomyositis without myopathy: Secondary | ICD-10-CM

## 2024-03-04 MED ORDER — HYDROXYCHLOROQUINE SULFATE 200 MG PO TABS: 200.0000 mg | ORAL_TABLET | Freq: Every day | ORAL | 3 refills | Status: DC

## 2024-03-04 NOTE — Patient Instructions (Signed)
 Hello Wesley [name],  Thank you for visiting today. Here is a summary of the key instructions:  Diagnosis: Dermatomyositis   - Medications:   - Continue taking hydroxychloroquine for skin inflammation   - Use triamcinolone cream twice a day for up to 2 weeks for itchiness   - Apply CeraVe Anti-Itch moisturizer as needed  - Medical Devices:   - Use a back applicator to help apply cream to your back  - Tests and Imaging to rule out potential malignancy:   - Schedule a chest X-ray    - Schedule a CT scan of the colon   - Call the imaging center to set up these tests  - Follow-up:   - Return for a follow-up appointment in 6 months   - Contact the office if you run out of cream before your next visit  - Other Instructions:   - See your GI doctor for a colonoscopy   - Visit your family doctor, Dr. Lajuan Lines, for routine screenings  Please reach out if you have any questions or concerns.  Warm regards,  Dr. [name]   Important Information  Due to recent changes in healthcare laws, you may see results of your pathology and/or laboratory studies on MyChart before the doctors have had a chance to review them. We understand that in some cases there may be results that are confusing or concerning to you. Please understand that not all results are received at the same time and often the doctors may need to interpret multiple results in order to provide you with the best plan of care or course of treatment. Therefore, we ask that you please give [place] 2 business days to thoroughly review all your results before contacting the office for clarification. Should we see a critical lab result, you will be contacted sooner.   If You Need Anything After Your Visit  If you have any questions or concerns for your doctor, please call our main line at [phone] If no one answers, please leave a voicemail as directed and we will return your call as soon as possible. Messages  left after 4 pm will be answered the following business day.   You may also send [place] a message via MyChart. We typically respond to MyChart messages within 1-2 business days.  For prescription refills, please ask your pharmacy to contact our office. Our fax number is [phone].  If you have an urgent issue when the clinic is closed that cannot wait until the next business day, you can page your doctor at the number below.    Please note that while we do our best to be available for urgent issues outside of office hours, we are not available 24/7.   If you have an urgent issue and are unable to reach [place], you may choose to seek medical care at your doctor's office, retail clinic, urgent care center, or emergency room.  If you have a medical emergency, please immediately call 911 or go to the emergency department. In the event of inclement weather, please call our main line at [phone] for an update on the status of any delays or closures.  Dermatology Medication Tips: Please keep the boxes that topical medications come in in order to help keep track of the instructions about where and how to use these. Pharmacies typically print the medication instructions only on the boxes and not directly on the medication tubes.   If your medication is too expensive, please contact our office  at [phone] or send [place] a message through MyChart.   We are unable to tell what your co-pay for medications will be in advance as this is different depending on your insurance coverage. However, we may be able to find a substitute medication at lower cost or fill out paperwork to get insurance to cover a needed medication.   If a prior authorization is required to get your medication covered by your insurance company, please allow [place] 1-2 business days to complete this process.  Drug prices often vary depending on where the prescription is filled and some pharmacies may offer cheaper prices.  The website  www.goodrx.com contains coupons for medications through different pharmacies. The prices here do not account for what the cost may be with help from insurance (it may be cheaper with your insurance), but the website can give you the price if you did not use any insurance.  - You can print the associated coupon and take it with your prescription to the pharmacy.  - You may also stop by our office during regular business hours and pick up a GoodRx coupon card.  - If you need your prescription sent electronically to a different pharmacy, notify our office through [facility] or by phone at [phone]

## 2024-03-04 NOTE — Progress Notes (Signed)
   Follow-Up Visit   Subjective  Wesley [name] is a 78 y.o. male who presents for the following: Patient here for a follow up for treatment for Dermatomyositis.  Patient states he has improvement but it isnt 100% gone.  He sad he take the Hydroxychoroquine daily but is unable to use the topicals due to ability to apply them.  The following portions of the chart were reviewed this encounter and updated as appropriate: medications, allergies, medical history  Review of Systems:  No other skin or systemic complaints except as noted in HPI or Assessment and Plan.  Objective  Well appearing patient in no apparent distress; mood and affect are within normal limits.    A focused examination was performed of the following areas:  Face, neck and back  Relevant exam findings are noted in the Assessment and Plan.    Assessment & Plan   1. 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 Initial treatment with prednisone cleared the rash, but it recurred after discontinuation. Current management with hydroxychloroquine has resulted in significant improvement, with the skin condition now approximately 90% better. Physical exam reveals erythematous plaques with mild scarring on the back, while lesions on the neck, face, arms, and abdomen have cleared.  - Plan:    Continue hydroxychloroquine for inflammation control    Refill triamcinolone cream for itchiness, to be used twice daily for up to 2 weeks    Prescribe 47-month supply of medication with 3 refills    Recommend CeraVe Anti-Itch moisturizer containing pyridoxine    Suggest using a back applicator for medication application    Follow-up in 6 months  2. 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 Given the  association between dermatomyositis and malignancy and his hx of smoking cigarettes since he was 78 years old ongoing surveillance is necessary. - Plan:    Order chest X-ray    Order CT scan of the colon    Patient to schedule imaging tests at the imaging center    Recommend follow-up with GI doctor for colonoscopy  3. Hyperlipidemia - Assessment: Patient has a history of hyperlipidemia, last addressed by their family doctor, Lajuan Lines, in 2023.  - Plan:    Recommend follow-up with family doctor for routine screenings and management of hyperlipidemia   Pre-visit planning reviewing the last office visit, labs, imaging and care everywhere when applicable was 10 minutes Intra-visit was 30 minutes and included updating the relevant history, performing a video or in-person physical exam as appropriate, creating a treatment plan and used shared decision making with the patient.  Post-visit was 10 minutes that encompassed note completion, placing of orders, updating patient instructions and coordination of care.    No follow-ups on file.  IManual Meier, Surg Tech III, am acting as scribe for [employer], DO.   Documentation: I have reviewed the above documentation for accuracy and completeness, and I agree with the above.  [name], DO

## 2024-09-06 ENCOUNTER — Ambulatory Visit: Admitting: Dermatology

## 2024-09-06 VITALS — BP 127/63

## 2024-09-06 DIAGNOSIS — L819 Disorder of pigmentation, unspecified: Secondary | ICD-10-CM

## 2024-09-06 DIAGNOSIS — L853 Xerosis cutis: Secondary | ICD-10-CM

## 2024-09-06 DIAGNOSIS — M3313 Other dermatomyositis without myopathy: Secondary | ICD-10-CM

## 2024-09-06 MED ORDER — HYDROXYCHLOROQUINE SULFATE 200 MG PO TABS: 200.0000 mg | ORAL_TABLET | Freq: Every day | ORAL | 3 refills | Status: AC

## 2024-09-06 NOTE — Progress Notes (Signed)
   Follow-Up Visit   Subjective  Wesley [name] is a 78 y.o. male who presents for the following: Dermatomyositis [facility])  Patient present today for follow up visit for Dermatomyositis. Patient was last evaluated on 03/04/2024. At this visit patient was advised to continue hydroxychloroquine  200 mg daily for inflammation control. He is not using [facility] 0.1% cream because he has not felt the need for it. Patient was recommenedded  CereVe Anti-Itch moisturizer with pramoxine. He feels like he is doing better but he never feels good. He is not getting any sleep. His PCP has given him Ambien  to help him sleep but it does not help. He was given mirtazapine  at night but he does not take it. Hx of colon cancer.   He is accompanied by his daughter today.  The following portions of the chart were reviewed this encounter and updated as appropriate: medications, allergies, medical history  Review of Systems:  No other skin or systemic complaints except as noted in HPI or Assessment and Plan.  Objective  Well appearing patient in no apparent distress; mood and affect are within normal limits.   A focused examination was performed of the following areas:   Relevant exam findings are noted in the Assessment and Plan.        Assessment & Plan   Dermatomyositis (stable and skin clear) Pt's skin is well-controlled with Plaquenil . Significant improvement in skin condition since last summer with no active inflammation. Some post-inflammatory hypopigmentation is present, but no active lesions on chest, back, or arms. Skin is dry, with a risk of increased dryness and itchiness as the weather gets colder.  - Continue Plaquenil  200 mg once daily. - Ensure annual ophthalmologic examination to monitor for corneal deposits due to long-term Plaquenil  use. - Encourage regular use of moisturizers such as CeraVe and Eucerin to prevent dryness and itchiness. - Schedule follow-up appointment in 8 months to  monitor skin condition and Plaquenil  therapy.     Return in about 7 months (around 04/06/2025) for end of May for follow up - Dermatomyocytis follow up.  I, Roseline Hutchinson, CMA, am acting as scribe for [employer], DO .   Documentation: I have reviewed the above documentation for accuracy and completeness, and I agree with the above.  Delon Lenis, DO

## 2024-09-06 NOTE — Patient Instructions (Signed)

## 2025-04-28 ENCOUNTER — Ambulatory Visit: Admitting: Dermatology
